# Patient Record
Sex: Female | Born: 1940 | Race: Black or African American | Hispanic: No | Smoking: Never smoker
Health system: Southern US, Community
[De-identification: ages and names within clinical notes are randomized; demographics above are authoritative.]

## PROBLEM LIST (undated history)

## (undated) DIAGNOSIS — M51369 Other intervertebral disc degeneration, lumbar region without mention of lumbar back pain or lower extremity pain: Secondary | ICD-10-CM

## (undated) DIAGNOSIS — M5136 Other intervertebral disc degeneration, lumbar region: Secondary | ICD-10-CM

## (undated) DIAGNOSIS — Z87442 Personal history of urinary calculi: Secondary | ICD-10-CM

## (undated) DIAGNOSIS — G2581 Restless legs syndrome: Secondary | ICD-10-CM

## (undated) DIAGNOSIS — N1330 Unspecified hydronephrosis: Secondary | ICD-10-CM

## (undated) DIAGNOSIS — D649 Anemia, unspecified: Secondary | ICD-10-CM

## (undated) DIAGNOSIS — K567 Ileus, unspecified: Secondary | ICD-10-CM

## (undated) DIAGNOSIS — M1990 Unspecified osteoarthritis, unspecified site: Secondary | ICD-10-CM

## (undated) DIAGNOSIS — M5416 Radiculopathy, lumbar region: Secondary | ICD-10-CM

## (undated) DIAGNOSIS — M810 Age-related osteoporosis without current pathological fracture: Secondary | ICD-10-CM

## (undated) DIAGNOSIS — E785 Hyperlipidemia, unspecified: Secondary | ICD-10-CM

## (undated) HISTORY — PX: TOTAL ABDOMINAL HYSTERECTOMY W/ BILATERAL SALPINGOOPHORECTOMY: SHX83

## (undated) HISTORY — PX: CATARACT EXTRACTION, BILATERAL: SHX1313

## (undated) HISTORY — DX: Hyperlipidemia, unspecified: E78.5

## (undated) HISTORY — PX: ABDOMINAL HYSTERECTOMY: SHX81

## (undated) HISTORY — PX: EYE SURGERY: SHX253

## (undated) HISTORY — DX: Ileus, unspecified: K56.7

## (undated) HISTORY — DX: Unspecified osteoarthritis, unspecified site: M19.90

## (undated) HISTORY — DX: Age-related osteoporosis without current pathological fracture: M81.0

## (undated) HISTORY — PX: ILEOANAL RESERVOIR EXCISION W/ ILEOSTOMY: SUR525

---

## 1978-08-03 HISTORY — PX: ILEOANAL RESERVOIR EXCISION W/ ILEOSTOMY: SUR525

## 1998-06-10 ENCOUNTER — Ambulatory Visit (HOSPITAL_COMMUNITY): Admission: RE | Admit: 1998-06-10 | Discharge: 1998-06-10 | Payer: Self-pay

## 1999-06-12 ENCOUNTER — Ambulatory Visit (HOSPITAL_COMMUNITY): Admission: RE | Admit: 1999-06-12 | Discharge: 1999-06-12 | Payer: Self-pay

## 2000-06-17 ENCOUNTER — Ambulatory Visit (HOSPITAL_COMMUNITY): Admission: RE | Admit: 2000-06-17 | Discharge: 2000-06-17 | Payer: Self-pay | Admitting: Family Medicine

## 2001-06-20 ENCOUNTER — Ambulatory Visit (HOSPITAL_COMMUNITY): Admission: RE | Admit: 2001-06-20 | Discharge: 2001-06-20 | Payer: Self-pay | Admitting: *Deleted

## 2002-06-22 ENCOUNTER — Ambulatory Visit (HOSPITAL_COMMUNITY): Admission: RE | Admit: 2002-06-22 | Discharge: 2002-06-22 | Payer: Self-pay | Admitting: *Deleted

## 2003-06-25 ENCOUNTER — Ambulatory Visit (HOSPITAL_COMMUNITY): Admission: RE | Admit: 2003-06-25 | Discharge: 2003-06-25 | Payer: Self-pay | Admitting: Obstetrics & Gynecology

## 2005-08-28 ENCOUNTER — Ambulatory Visit: Payer: Self-pay | Admitting: *Deleted

## 2005-09-16 ENCOUNTER — Ambulatory Visit: Payer: Self-pay | Admitting: *Deleted

## 2006-08-02 ENCOUNTER — Ambulatory Visit: Payer: Self-pay | Admitting: Obstetrics and Gynecology

## 2006-08-06 ENCOUNTER — Ambulatory Visit: Payer: Self-pay | Admitting: Obstetrics and Gynecology

## 2007-06-03 ENCOUNTER — Ambulatory Visit: Payer: Self-pay | Admitting: Obstetrics and Gynecology

## 2008-06-05 ENCOUNTER — Ambulatory Visit: Payer: Self-pay | Admitting: Obstetrics and Gynecology

## 2009-06-06 ENCOUNTER — Ambulatory Visit: Payer: Self-pay | Admitting: Obstetrics and Gynecology

## 2010-06-19 ENCOUNTER — Ambulatory Visit: Payer: Self-pay | Admitting: Obstetrics and Gynecology

## 2011-07-22 ENCOUNTER — Ambulatory Visit: Payer: Self-pay | Admitting: Obstetrics and Gynecology

## 2012-07-25 ENCOUNTER — Ambulatory Visit: Payer: Self-pay | Admitting: Obstetrics and Gynecology

## 2012-12-02 ENCOUNTER — Ambulatory Visit: Payer: Self-pay

## 2013-08-01 ENCOUNTER — Ambulatory Visit: Payer: Self-pay | Admitting: Obstetrics and Gynecology

## 2013-10-03 ENCOUNTER — Ambulatory Visit: Payer: Self-pay | Admitting: Family Medicine

## 2014-08-07 ENCOUNTER — Ambulatory Visit: Payer: Self-pay | Admitting: Obstetrics and Gynecology

## 2014-08-07 DIAGNOSIS — Z1231 Encounter for screening mammogram for malignant neoplasm of breast: Secondary | ICD-10-CM | POA: Diagnosis not present

## 2014-09-19 DIAGNOSIS — M810 Age-related osteoporosis without current pathological fracture: Secondary | ICD-10-CM | POA: Diagnosis not present

## 2014-09-19 DIAGNOSIS — M1990 Unspecified osteoarthritis, unspecified site: Secondary | ICD-10-CM | POA: Diagnosis not present

## 2014-09-19 DIAGNOSIS — E785 Hyperlipidemia, unspecified: Secondary | ICD-10-CM | POA: Diagnosis not present

## 2014-09-19 DIAGNOSIS — Z0000 Encounter for general adult medical examination without abnormal findings: Secondary | ICD-10-CM | POA: Diagnosis not present

## 2014-09-19 DIAGNOSIS — K5190 Ulcerative colitis, unspecified, without complications: Secondary | ICD-10-CM | POA: Diagnosis not present

## 2015-02-28 ENCOUNTER — Telehealth: Payer: Self-pay

## 2015-02-28 NOTE — Telephone Encounter (Signed)
Pt called requests call back from [facility]. Thanks.

## 2015-03-01 ENCOUNTER — Encounter: Payer: Self-pay | Admitting: Family Medicine

## 2015-03-01 ENCOUNTER — Ambulatory Visit (INDEPENDENT_AMBULATORY_CARE_PROVIDER_SITE_OTHER): Payer: Medicare Other | Admitting: Family Medicine

## 2015-03-01 VITALS — BP 122/85 | HR 94 | Temp 98.1°F | Ht 59.1 in | Wt 166.0 lb

## 2015-03-01 DIAGNOSIS — R1031 Right lower quadrant pain: Secondary | ICD-10-CM | POA: Diagnosis not present

## 2015-03-01 LAB — CBC WITH DIFFERENTIAL/PLATELET
Hematocrit: 44.8 % (ref 34.0–46.6)
Hemoglobin: 16.1 g/dL — ABNORMAL HIGH (ref 11.1–15.9)
Lymphocytes Absolute: 2.2 10*3/uL (ref 0.7–3.1)
Lymphs: 41 %
MCH: 30.5 pg (ref 26.6–33.0)
MCHC: 35.9 g/dL — ABNORMAL HIGH (ref 31.5–35.7)
MCV: 85 fL (ref 79–97)
MID (Absolute): 0.7 10*3/uL (ref 0.1–1.6)
MID: 13 %
Neutrophils Absolute: 2.5 10*3/uL (ref 1.4–7.0)
Neutrophils: 46 %
Platelets: 275 10*3/uL (ref 150–379)
RBC: 5.28 x10E6/uL (ref 3.77–5.28)
RDW: 13.5 % (ref 12.3–15.4)
WBC: 5.4 10*3/uL (ref 3.4–10.8)

## 2015-03-01 MED ORDER — METRONIDAZOLE 500 MG PO TABS: 500.0000 mg | ORAL_TABLET | Freq: Two times a day (BID) | ORAL | Status: DC

## 2015-03-01 MED ORDER — CIPROFLOXACIN HCL 500 MG PO TABS: 500.0000 mg | ORAL_TABLET | Freq: Two times a day (BID) | ORAL | Status: DC

## 2015-03-01 NOTE — Progress Notes (Signed)
BP 122/85 mmHg  Pulse 94  Temp(Src) 98.1 F (36.7 C)  Ht 4' 11.1" (1.501 m)  Wt 166 lb (75.297 kg)  BMI 33.42 kg/m2  SpO2 98%   Subjective:    Patient ID: Victoria Peterson, female    DOB: 10/17/1940, 74 y.o.   MRN: [id number]  HPI: Victoria Peterson is a 74 y.o. female  Chief Complaint  Patient presents with  . Abdominal Pain    Patient has a BCIR 18 years ago, patient is concerned that it may be inflammed. She has been pouring sweat, complaining of lightheadness.   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  Duration: 5 days Onset: sudden Severity: severe Quality: sharp, stabbing and tearing Location:  right in her reservoir   Radiation: no Frequency: constant Alleviating factors: nothing Aggravating factors: nothing Status: stable Treatments attempted:  Fever: no, lots of sweats Nausea: no Vomiting: no Weight loss: no Decreased appetite: no Diarrhea: yes- every hour on the hour- has been green and profuse Constipation: no Blood in stool: no Heartburn: yes Jaundice: no Rash: no Dysuria/urinary frequency: no Hematuria: no    Relevant past medical, surgical, family and social history reviewed and updated as indicated. Interim medical history since our last visit reviewed. Allergies and medications reviewed and updated.  Review of Systems  Constitutional: Negative.   Respiratory: Negative.   Cardiovascular: Negative.   Gastrointestinal: Positive for abdominal pain and diarrhea. Negative for nausea, vomiting, constipation, blood in stool, abdominal distention, anal bleeding and rectal pain.  Genitourinary: Negative.   Psychiatric/Behavioral: Negative.     Per HPI unless specifically indicated above     Objective:    BP 122/85 mmHg  Pulse 94  Temp(Src) 98.1 F (36.7 C)  Ht 4' 11.1" (1.501 m)  Wt 166 lb  (75.297 kg)  BMI 33.42 kg/m2  SpO2 98%  Wt Readings from Last 3 Encounters:  03/01/15 166 lb (75.297 kg)  09/19/14 176 lb (79.833 kg)    Physical Exam  Constitutional: She is oriented to person, place, and time. She appears well-developed and well-nourished. No distress.  HENT:  Head: Normocephalic and atraumatic.  Right Ear: Hearing normal.  Left Ear: Hearing normal.  Nose: Nose normal.  Eyes: Conjunctivae and lids are normal. Right eye exhibits no discharge. Left eye exhibits no discharge. No scleral icterus.  Cardiovascular: Normal rate, regular rhythm, normal heart sounds and intact distal pulses.  Exam reveals no gallop and no friction rub.   No murmur heard. Pulmonary/Chest: Effort normal. No respiratory distress. She has no wheezes. She has no rales. She exhibits no tenderness.  Abdominal: Soft. Bowel sounds are normal. She exhibits no distension and no mass. There is tenderness. There is no rebound and no guarding.  Throughout, especially RLQ  Musculoskeletal: Normal range of motion.  Neurological: She is alert and oriented to person, place, and time.  Skin: Skin is warm, dry and intact. No rash noted. No erythema. No pallor.  Psychiatric: She has a normal mood and affect. Her speech is normal and behavior is normal. Judgment and thought content normal. Cognition and memory are normal.  Nursing note and vitals reviewed.   No results found for this or any previous visit.    Assessment & Plan:   Problem List Items Addressed This Visit    None    Visit Diagnoses  Right lower quadrant abdominal pain    -  Primary    Normal CBC today. No concern for perforation. WIll check CMP. Will treat with cipro and flagyl. Call if not getting better or getting worse.     Relevant Orders    Comprehensive metabolic panel    CBC With Differential/Platelet        Follow up plan: Return for As scheduled with MAC.

## 2015-03-02 LAB — COMPREHENSIVE METABOLIC PANEL
ALT: 24 IU/L (ref 0–32)
AST: 30 IU/L (ref 0–40)
Albumin/Globulin Ratio: 1.6 (ref 1.1–2.5)
Albumin: 4.6 g/dL (ref 3.5–4.8)
Alkaline Phosphatase: 78 IU/L (ref 39–117)
BUN/Creatinine Ratio: 23 (ref 11–26)
BUN: 29 mg/dL — ABNORMAL HIGH (ref 8–27)
Bilirubin Total: 1.2 mg/dL (ref 0.0–1.2)
CO2: 16 mmol/L — ABNORMAL LOW (ref 18–29)
Calcium: 9.1 mg/dL (ref 8.7–10.3)
Chloride: 94 mmol/L — ABNORMAL LOW (ref 97–108)
Creatinine, Ser: 1.26 mg/dL — ABNORMAL HIGH (ref 0.57–1.00)
GFR calc Af Amer: 49 mL/min/{1.73_m2} — ABNORMAL LOW (ref >59)
GFR calc non Af Amer: 43 mL/min/{1.73_m2} — ABNORMAL LOW (ref >59)
Globulin, Total: 2.8 g/dL (ref 1.5–4.5)
Glucose: 109 mg/dL — ABNORMAL HIGH (ref 65–99)
Potassium: 3.6 mmol/L (ref 3.5–5.2)
Sodium: 135 mmol/L (ref 134–144)
Total Protein: 7.4 g/dL (ref 6.0–8.5)

## 2015-03-05 ENCOUNTER — Telehealth: Payer: Self-pay | Admitting: Family Medicine

## 2015-03-05 NOTE — Telephone Encounter (Signed)
Patient notified

## 2015-03-05 NOTE — Telephone Encounter (Signed)
Please let her know that she looked a little dehydrated on her blood work. We'll recheck it when she comes back in to see Dr. Jeananne Rama, but no sign of anything wrong with her gut. Thanks!

## 2015-07-12 DIAGNOSIS — M170 Bilateral primary osteoarthritis of knee: Secondary | ICD-10-CM | POA: Diagnosis not present

## 2015-07-12 DIAGNOSIS — M1712 Unilateral primary osteoarthritis, left knee: Secondary | ICD-10-CM | POA: Diagnosis not present

## 2015-08-08 ENCOUNTER — Other Ambulatory Visit: Payer: Self-pay | Admitting: Obstetrics and Gynecology

## 2015-08-08 DIAGNOSIS — Z1231 Encounter for screening mammogram for malignant neoplasm of breast: Secondary | ICD-10-CM

## 2015-08-08 DIAGNOSIS — Z01419 Encounter for gynecological examination (general) (routine) without abnormal findings: Secondary | ICD-10-CM | POA: Diagnosis not present

## 2015-08-20 ENCOUNTER — Other Ambulatory Visit: Payer: Self-pay | Admitting: Obstetrics and Gynecology

## 2015-08-20 ENCOUNTER — Ambulatory Visit
Admission: RE | Admit: 2015-08-20 | Discharge: 2015-08-20 | Disposition: A | Discharge status: Home / Self Care | Payer: Medicare Other | Source: Ambulatory Visit | Attending: Obstetrics and Gynecology | Admitting: Obstetrics and Gynecology

## 2015-08-20 DIAGNOSIS — Z1231 Encounter for screening mammogram for malignant neoplasm of breast: Secondary | ICD-10-CM

## 2015-10-15 ENCOUNTER — Ambulatory Visit (INDEPENDENT_AMBULATORY_CARE_PROVIDER_SITE_OTHER): Payer: Medicare Other | Admitting: Family Medicine

## 2015-10-15 ENCOUNTER — Encounter: Payer: Self-pay | Admitting: Family Medicine

## 2015-10-15 VITALS — BP 111/79 | HR 78 | Temp 97.9°F | Ht 59.7 in | Wt 177.0 lb

## 2015-10-15 DIAGNOSIS — K5190 Ulcerative colitis, unspecified, without complications: Secondary | ICD-10-CM

## 2015-10-15 DIAGNOSIS — Z932 Ileostomy status: Secondary | ICD-10-CM

## 2015-10-15 DIAGNOSIS — M170 Bilateral primary osteoarthritis of knee: Secondary | ICD-10-CM | POA: Insufficient documentation

## 2015-10-15 DIAGNOSIS — K51918 Ulcerative colitis, unspecified with other complication: Secondary | ICD-10-CM | POA: Diagnosis not present

## 2015-10-15 DIAGNOSIS — Z0000 Encounter for general adult medical examination without abnormal findings: Secondary | ICD-10-CM

## 2015-10-15 DIAGNOSIS — M129 Arthropathy, unspecified: Secondary | ICD-10-CM

## 2015-10-15 DIAGNOSIS — Z8719 Personal history of other diseases of the digestive system: Secondary | ICD-10-CM | POA: Insufficient documentation

## 2015-10-15 LAB — URINALYSIS, ROUTINE W REFLEX MICROSCOPIC
BILIRUBIN UA: NEGATIVE
GLUCOSE, UA: NEGATIVE
Ketones, UA: NEGATIVE
Nitrite, UA: NEGATIVE
PH UA: 5 (ref 5.0–7.5)
PROTEIN UA: NEGATIVE
Specific Gravity, UA: <1.005 — ABNORMAL LOW (ref 1.005–1.030)
Urobilinogen, Ur: 0.2 mg/dL (ref 0.2–1.0)

## 2015-10-15 LAB — MICROSCOPIC EXAMINATION

## 2015-10-15 NOTE — Assessment & Plan Note (Signed)
Stable on occ meds

## 2015-10-15 NOTE — Progress Notes (Signed)
BP 111/79 mmHg  Pulse 78  Temp(Src) 97.9 F (36.6 C)  Ht 4' 11.7" (1.516 m)  Wt 177 lb (80.287 kg)  BMI 34.93 kg/m2  SpO2 98%   Subjective:    Patient ID: Victoria Peterson, female    DOB: Dec 06, 1940, 75 y.o.   MRN: [id number]  HPI: Victoria Peterson is a 75 y.o. female  Chief Complaint  Patient presents with  . Annual Exam   patient with bilateral arthritis in knees bother her when he is getting ready to range and otherwise knees do okay takes an occasional Aleve Knees will slow her down especially in the morning but don't stop her activity. [facility] had a shot which helped an all in all is doing okay.  Patient also has some back right back pain comes and goes no radiation no blood in stool or urine and occasional Tylenol or Aleve may help that. No known trauma or irritation.  Relevant past medical, surgical, family and social history reviewed and updated as indicated. Interim medical history since our last visit reviewed. Allergies and medications reviewed and updated.  Review of Systems  Constitutional: Negative.   HENT: Negative.   Eyes: Negative.   Respiratory: Negative.   Cardiovascular: Negative.   Gastrointestinal: Negative.   Endocrine: Negative.   Genitourinary: Negative.   Musculoskeletal: Negative.   Skin: Negative.   Allergic/Immunologic: Negative.   Neurological: Negative.   Hematological: Negative.   Psychiatric/Behavioral: Negative.     Per HPI unless specifically indicated above     Objective:    BP 111/79 mmHg  Pulse 78  Temp(Src) 97.9 F (36.6 C)  Ht 4' 11.7" (1.516 m)  Wt 177 lb (80.287 kg)  BMI 34.93 kg/m2  SpO2 98%  Wt Readings from Last 3 Encounters:  10/15/15 177 lb (80.287 kg)  03/01/15 166 lb (75.297 kg)  09/19/14 176 lb (79.833 kg)    Physical Exam  Constitutional: She is oriented to person, place, and time. She appears well-developed and well-nourished.  HENT:  Head: Normocephalic and atraumatic.  Right  Ear: External ear normal.  Left Ear: External ear normal.  Nose: Nose normal.  Mouth/Throat: Oropharynx is clear and moist.  Eyes: Conjunctivae and EOM are normal. Pupils are equal, round, and reactive to light.  Neck: Normal range of motion. Neck supple. Carotid bruit is not present.  Cardiovascular: Normal rate, regular rhythm and normal heart sounds.   No murmur heard. Pulmonary/Chest: Effort normal and breath sounds normal. She exhibits no mass. Right breast exhibits no mass, no skin change and no tenderness. Left breast exhibits no mass, no skin change and no tenderness. Breasts are symmetrical.  Abdominal: Soft. Bowel sounds are normal. There is no hepatosplenomegaly.  Musculoskeletal: Normal range of motion.  Neurological: She is alert and oriented to person, place, and time.  Skin: No rash noted.  Psychiatric: She has a normal mood and affect. Her behavior is normal. Judgment and thought content normal.    Results for orders placed or performed in visit on 03/01/15  Comprehensive metabolic panel  Result Value Ref Range   Glucose 109 (H) 65 - 99 mg/dL   BUN 29 (H) 8 - 27 mg/dL   Creatinine, Ser 1.26 (H) 0.57 - 1.00 mg/dL   GFR calc non Af Amer 43 (L) >59 mL/min/1.73   GFR calc Af Amer 49 (L) >59 mL/min/1.73   BUN/Creatinine Ratio 23 11 - 26   Sodium 135 134 - 144 mmol/L   Potassium 3.6 3.5 -  5.2 mmol/L   Chloride 94 (L) 97 - 108 mmol/L   CO2 16 (L) 18 - 29 mmol/L   Calcium 9.1 8.7 - 10.3 mg/dL   Total Protein 7.4 6.0 - 8.5 g/dL   Albumin 4.6 3.5 - 4.8 g/dL   Globulin, Total 2.8 1.5 - 4.5 g/dL   Albumin/Globulin Ratio 1.6 1.1 - 2.5   Bilirubin Total 1.2 0.0 - 1.2 mg/dL   Alkaline Phosphatase 78 39 - 117 IU/L   AST 30 0 - 40 IU/L   ALT 24 0 - 32 IU/L  CBC With Differential/Platelet  Result Value Ref Range   WBC 5.4 3.4 - 10.8 x10E3/uL   RBC 5.28 3.77 - 5.28 x10E6/uL   Hemoglobin 16.1 (H) 11.1 - 15.9 g/dL   Hematocrit 44.8 34.0 - 46.6 %   MCV 85 79 - 97 fL   MCH 30.5  26.6 - 33.0 pg   MCHC 35.9 (H) 31.5 - 35.7 g/dL   RDW 13.5 12.3 - 15.4 %   Platelets 275 150 - 379 x10E3/uL   Neutrophils 46 %   Lymphs 41 %   MID 13 %   Neutrophils Absolute 2.5 1.4 - 7.0 x10E3/uL   Lymphocytes Absolute 2.2 0.7 - 3.1 x10E3/uL   MID (Absolute) 0.7 0.1 - 1.6 X10E3/uL      Assessment & Plan:   Problem List Items Addressed This Visit      Digestive   Severe chronic ulcerative colitis (HCC)   Relevant Orders   Lipid panel   CBC with Differential/Platelet   TSH   Comprehensive metabolic panel   Urinalysis, Routine w reflex microscopic (not at [facility])     Musculoskeletal and Integument   Arthritis of both knees    Stable on occ meds      Relevant Orders   Lipid panel   CBC with Differential/Platelet   TSH   Comprehensive metabolic panel   Urinalysis, Routine w reflex microscopic (not at [facility])     Other   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

## 2015-10-16 ENCOUNTER — Encounter: Payer: Self-pay | Admitting: Family Medicine

## 2015-10-16 LAB — COMPREHENSIVE METABOLIC PANEL
A/G RATIO: 1.6 (ref 1.2–2.2)
ALT: 15 IU/L (ref 0–32)
AST: 19 IU/L (ref 0–40)
Albumin: 4.3 g/dL (ref 3.5–4.8)
Alkaline Phosphatase: 53 IU/L (ref 39–117)
BUN/Creatinine Ratio: 11 (ref 11–26)
BUN: 10 mg/dL (ref 8–27)
Bilirubin Total: 1 mg/dL (ref 0.0–1.2)
CALCIUM: 9.4 mg/dL (ref 8.7–10.3)
CO2: 24 mmol/L (ref 18–29)
CREATININE: 0.93 mg/dL (ref 0.57–1.00)
Chloride: 102 mmol/L (ref 96–106)
GFR calc Af Amer: 71 mL/min/{1.73_m2} (ref >59)
GFR, EST NON AFRICAN AMERICAN: 61 mL/min/{1.73_m2} (ref >59)
Globulin, Total: 2.7 g/dL (ref 1.5–4.5)
Glucose: 87 mg/dL (ref 65–99)
POTASSIUM: 4.3 mmol/L (ref 3.5–5.2)
Sodium: 140 mmol/L (ref 134–144)
Total Protein: 7 g/dL (ref 6.0–8.5)

## 2015-10-16 LAB — CBC WITH DIFFERENTIAL/PLATELET
BASOS ABS: 0 10*3/uL (ref 0.0–0.2)
Basos: 0 %
EOS (ABSOLUTE): 0 10*3/uL (ref 0.0–0.4)
Eos: 1 %
Hematocrit: 41.4 % (ref 34.0–46.6)
Hemoglobin: 14.4 g/dL (ref 11.1–15.9)
IMMATURE GRANS (ABS): 0 10*3/uL (ref 0.0–0.1)
IMMATURE GRANULOCYTES: 0 %
LYMPHS: 37 %
Lymphocytes Absolute: 1.8 10*3/uL (ref 0.7–3.1)
MCH: 29.8 pg (ref 26.6–33.0)
MCHC: 34.8 g/dL (ref 31.5–35.7)
MCV: 86 fL (ref 79–97)
Monocytes Absolute: 0.5 10*3/uL (ref 0.1–0.9)
Monocytes: 10 %
NEUTROS PCT: 52 %
Neutrophils Absolute: 2.5 10*3/uL (ref 1.4–7.0)
PLATELETS: 249 10*3/uL (ref 150–379)
RBC: 4.84 x10E6/uL (ref 3.77–5.28)
RDW: 13.2 % (ref 12.3–15.4)
WBC: 4.8 10*3/uL (ref 3.4–10.8)

## 2015-10-16 LAB — TSH: TSH: 0.932 u[IU]/mL (ref 0.450–4.500)

## 2015-10-16 LAB — LIPID PANEL
CHOL/HDL RATIO: 2.8 ratio (ref 0.0–4.4)
Cholesterol, Total: 263 mg/dL — ABNORMAL HIGH (ref 100–199)
HDL: 95 mg/dL (ref >39)
LDL CALC: 153 mg/dL — AB (ref 0–99)
Triglycerides: 75 mg/dL (ref 0–149)
VLDL Cholesterol Cal: 15 mg/dL (ref 5–40)

## 2016-08-04 ENCOUNTER — Other Ambulatory Visit: Payer: Self-pay | Admitting: Family Medicine

## 2016-08-04 DIAGNOSIS — Z1231 Encounter for screening mammogram for malignant neoplasm of breast: Secondary | ICD-10-CM

## 2016-08-07 ENCOUNTER — Encounter: Payer: Self-pay | Admitting: Family Medicine

## 2016-08-07 ENCOUNTER — Ambulatory Visit: Payer: Medicare Other | Admitting: Family Medicine

## 2016-08-07 VITALS — BP 112/81 | HR 112 | Temp 98.0°F | Wt 167.0 lb

## 2016-08-07 DIAGNOSIS — R55 Syncope and collapse: Secondary | ICD-10-CM

## 2016-08-07 NOTE — Progress Notes (Signed)
BP 112/81   Pulse (!) 112   Temp 98 F (36.7 C)   Wt 167 lb (75.8 kg)   SpO2 99%   BMI 32.94 kg/m    Subjective:    Patient ID: Victoria Peterson, female    DOB: October 22, 1940, 76 y.o.   MRN: [id number]  HPI: Victoria Peterson is a 76 y.o. female  Chief Complaint  Patient presents with  . URI    x 6 days, chest tightness, sore throat, productive cough, head congestion, pressure with right ear. No fever.  . Loss of Consciousness    happened yesterday, when she got up, she states every thing started getting grey. She woke up and she was on the floor. Not sure how long she'd been down. Got some water and she was fine.     Relevant past medical, surgical, family and social history reviewed and updated as indicated. Interim medical history since our last visit reviewed. Allergies and medications reviewed and updated.  Review of Systems  Per HPI unless specifically indicated above     Objective:    BP 112/81   Pulse (!) 112   Temp 98 F (36.7 C)   Wt 167 lb (75.8 kg)   SpO2 99%   BMI 32.94 kg/m   Wt Readings from Last 3 Encounters:  08/07/16 167 lb (75.8 kg)  10/15/15 177 lb (80.3 kg)  03/01/15 166 lb (75.3 kg)    Physical Exam  Results for orders placed or performed in visit on 10/15/15  Microscopic Examination  Result Value Ref Range   WBC, UA 0-5 0 - 5 /hpf   RBC, UA 0-2 0 - 2 /hpf   Epithelial Cells (non renal) 0-10 0 - 10 /hpf   Mucus, UA Present Not Estab.   Bacteria, UA Few None seen/Few  Lipid panel  Result Value Ref Range   Cholesterol, Total 263 (H) 100 - 199 mg/dL   Triglycerides 75 0 - 149 mg/dL   HDL 95 >39 mg/dL   VLDL Cholesterol Cal 15 5 - 40 mg/dL   LDL Calculated 153 (H) 0 - 99 mg/dL   Chol/HDL Ratio 2.8 0.0 - 4.4 ratio units  CBC with Differential/Platelet  Result Value Ref Range   WBC 4.8 3.4 - 10.8 x10E3/uL   RBC 4.84 3.77 - 5.28 x10E6/uL   Hemoglobin 14.4 11.1 - 15.9 g/dL   Hematocrit 41.4 34.0 - 46.6 %   MCV 86 79 - 97 fL   MCH 29.8  26.6 - 33.0 pg   MCHC 34.8 31.5 - 35.7 g/dL   RDW 13.2 12.3 - 15.4 %   Platelets 249 150 - 379 x10E3/uL   Neutrophils 52 %   Lymphs 37 %   Monocytes 10 %   Eos 1 %   Basos 0 %   Neutrophils Absolute 2.5 1.4 - 7.0 x10E3/uL   Lymphocytes Absolute 1.8 0.7 - 3.1 x10E3/uL   Monocytes Absolute 0.5 0.1 - 0.9 x10E3/uL   EOS (ABSOLUTE) 0.0 0.0 - 0.4 x10E3/uL   Basophils Absolute 0.0 0.0 - 0.2 x10E3/uL   Immature Granulocytes 0 %   Immature Grans (Abs) 0.0 0.0 - 0.1 x10E3/uL  TSH  Result Value Ref Range   TSH 0.932 0.450 - 4.500 uIU/mL  Comprehensive metabolic panel  Result Value Ref Range   Glucose 87 65 - 99 mg/dL   BUN 10 8 - 27 mg/dL   Creatinine, Ser 0.93 0.57 - 1.00 mg/dL   GFR calc non Af Amer 61 >59  mL/min/1.73   GFR calc Af Amer 71 >59 mL/min/1.73   BUN/Creatinine Ratio 11 11 - 26   Sodium 140 134 - 144 mmol/L   Potassium 4.3 3.5 - 5.2 mmol/L   Chloride 102 96 - 106 mmol/L   CO2 24 18 - 29 mmol/L   Calcium 9.4 8.7 - 10.3 mg/dL   Total Protein 7.0 6.0 - 8.5 g/dL   Albumin 4.3 3.5 - 4.8 g/dL   Globulin, Total 2.7 1.5 - 4.5 g/dL   Albumin/Globulin Ratio 1.6 1.2 - 2.2   Bilirubin Total 1.0 0.0 - 1.2 mg/dL   Alkaline Phosphatase 53 39 - 117 IU/L   AST 19 0 - 40 IU/L   ALT 15 0 - 32 IU/L  Urinalysis, Routine w reflex microscopic (not at [facility])  Result Value Ref Range   Specific Gravity, UA <1.005 (L) 1.005 - 1.030   pH, UA 5.0 5.0 - 7.5   Color, UA Yellow Yellow   Appearance Ur Clear Clear   Leukocytes, UA Trace (A) Negative   Protein, UA Negative Negative/Trace   Glucose, UA Negative Negative   Ketones, UA Negative Negative   RBC, UA 3+ (A) Negative   Bilirubin, UA Negative Negative   Urobilinogen, Ur 0.2 0.2 - 1.0 mg/dL   Nitrite, UA Negative Negative   Microscopic Examination See below:       Assessment & Plan:   Problem List Items Addressed This Visit    None    Visit Diagnoses    Syncope, unspecified syncope type    -  Primary   Discussed need for full  evaluation at ER, especially given fall from standing with unknown head impact. Discussed that she is not to drive.     Patient will have daughter pick her up from office and head to the ER for evaluation as recommended.   Follow up plan: Return if symptoms worsen or fail to improve.

## 2016-08-07 NOTE — Patient Instructions (Signed)
Please have a friend or family member drive you to the ER to be fully evaluated.

## 2016-09-02 ENCOUNTER — Ambulatory Visit
Admission: RE | Admit: 2016-09-02 | Discharge: 2016-09-02 | Disposition: A | Discharge status: Home / Self Care | Payer: Medicare Other | Source: Ambulatory Visit | Attending: Family Medicine | Admitting: Family Medicine

## 2016-09-02 DIAGNOSIS — Z1231 Encounter for screening mammogram for malignant neoplasm of breast: Secondary | ICD-10-CM | POA: Insufficient documentation

## 2016-10-20 ENCOUNTER — Ambulatory Visit (INDEPENDENT_AMBULATORY_CARE_PROVIDER_SITE_OTHER): Payer: Medicare Other | Admitting: Family Medicine

## 2016-10-20 ENCOUNTER — Encounter: Payer: Self-pay | Admitting: Family Medicine

## 2016-10-20 VITALS — BP 137/83 | HR 77 | Ht 60.24 in | Wt 176.2 lb

## 2016-10-20 DIAGNOSIS — K51918 Ulcerative colitis, unspecified with other complication: Secondary | ICD-10-CM | POA: Diagnosis not present

## 2016-10-20 DIAGNOSIS — Z13220 Encounter for screening for lipoid disorders: Secondary | ICD-10-CM

## 2016-10-20 DIAGNOSIS — Z0000 Encounter for general adult medical examination without abnormal findings: Secondary | ICD-10-CM

## 2016-10-20 DIAGNOSIS — Z1329 Encounter for screening for other suspected endocrine disorder: Secondary | ICD-10-CM

## 2016-10-20 DIAGNOSIS — M170 Bilateral primary osteoarthritis of knee: Secondary | ICD-10-CM

## 2016-10-20 DIAGNOSIS — R55 Syncope and collapse: Secondary | ICD-10-CM | POA: Diagnosis not present

## 2016-10-20 LAB — URINALYSIS, ROUTINE W REFLEX MICROSCOPIC
Bilirubin, UA: NEGATIVE
Glucose, UA: NEGATIVE
Ketones, UA: NEGATIVE
Nitrite, UA: NEGATIVE
PH UA: 5 (ref 5.0–7.5)
PROTEIN UA: NEGATIVE
Specific Gravity, UA: 1.01 (ref 1.005–1.030)
Urobilinogen, Ur: 0.2 mg/dL (ref 0.2–1.0)

## 2016-10-20 LAB — MICROSCOPIC EXAMINATION: BACTERIA UA: NONE SEEN

## 2016-10-20 NOTE — Assessment & Plan Note (Signed)
The current medical regimen is effective;  continue present plan and medications.  

## 2016-10-20 NOTE — Assessment & Plan Note (Signed)
Syncope most likely due to vasovagal event. EKG with no acute changes or rhythm concerns. Patient education given on hydration and fluid status which patient is already doing.

## 2016-10-20 NOTE — Progress Notes (Signed)
BP 137/83   Pulse 77   Ht 5' 0.24" (1.53 m)   Wt 176 lb 3.2 oz (79.9 kg)   SpO2 95%   BMI 34.14 kg/m    Subjective:    Patient ID: Victoria Peterson, female    DOB: February 26, 1941, 76 y.o.   MRN: [id number]  HPI: Victoria Peterson is a 76 y.o. female  Chief Complaint  Patient presents with  . Annual Exam   Patient all in all doing well except noted in January had a syncopal spell. This spell lasted only for a few seconds patient got up out of bed started walking to the kitchen and found herself on the floor just a couple steps away from the bed. She then got up and was okay was concerned about ear infection and came to the office was referred to the emergency room but never went. Patient's been fine since with no other symptoms. Does have some stiffness in the morning when she gets up. No problems with ulcerative colitis only taking vitamins and aspirin.   Relevant past medical, surgical, family and social history reviewed and updated as indicated. Interim medical history since our last visit reviewed. Allergies and medications reviewed and updated.  Review of Systems  Constitutional: Negative.   HENT: Negative.   Eyes: Negative.   Respiratory: Negative.   Cardiovascular: Negative.   Gastrointestinal: Negative.   Endocrine: Negative.   Genitourinary: Negative.   Musculoskeletal: Negative.   Skin: Negative.   Allergic/Immunologic: Negative.   Neurological: Negative.   Hematological: Negative.   Psychiatric/Behavioral: Negative.     Per HPI unless specifically indicated above     Objective:    BP 137/83   Pulse 77   Ht 5' 0.24" (1.53 m)   Wt 176 lb 3.2 oz (79.9 kg)   SpO2 95%   BMI 34.14 kg/m   Wt Readings from Last 3 Encounters:  10/20/16 176 lb 3.2 oz (79.9 kg)  08/07/16 167 lb (75.8 kg)  10/15/15 177 lb (80.3 kg)    Physical Exam  Constitutional: She is oriented to person, place, and time. She appears well-developed and well-nourished.  HENT:  Head:  Normocephalic and atraumatic.  Right Ear: External ear normal.  Left Ear: External ear normal.  Nose: Nose normal.  Mouth/Throat: Oropharynx is clear and moist.  Eyes: Conjunctivae and EOM are normal. Pupils are equal, round, and reactive to light.  Neck: Normal range of motion. Neck supple. Carotid bruit is not present.  Cardiovascular: Normal rate, regular rhythm and normal heart sounds.   No murmur heard. Pulmonary/Chest: Effort normal and breath sounds normal. She exhibits no mass. Right breast exhibits no mass, no skin change and no tenderness. Left breast exhibits no mass, no skin change and no tenderness. Breasts are symmetrical.  Abdominal: Soft. Bowel sounds are normal. There is no hepatosplenomegaly.  Musculoskeletal: Normal range of motion.  Neurological: She is alert and oriented to person, place, and time.  Skin: No rash noted.  Psychiatric: She has a normal mood and affect. Her behavior is normal. Judgment and thought content normal.    Results for orders placed or performed in visit on 10/15/15  Microscopic Examination  Result Value Ref Range   WBC, UA 0-5 0 - 5 /hpf   RBC, UA 0-2 0 - 2 /hpf   Epithelial Cells (non renal) 0-10 0 - 10 /hpf   Mucus, UA Present Not Estab.   Bacteria, UA Few None seen/Few  Lipid panel  Result Value Ref Range  Cholesterol, Total 263 (H) 100 - 199 mg/dL   Triglycerides 75 0 - 149 mg/dL   HDL 95 >39 mg/dL   VLDL Cholesterol Cal 15 5 - 40 mg/dL   LDL Calculated 153 (H) 0 - 99 mg/dL   Chol/HDL Ratio 2.8 0.0 - 4.4 ratio units  CBC with Differential/Platelet  Result Value Ref Range   WBC 4.8 3.4 - 10.8 x10E3/uL   RBC 4.84 3.77 - 5.28 x10E6/uL   Hemoglobin 14.4 11.1 - 15.9 g/dL   Hematocrit 41.4 34.0 - 46.6 %   MCV 86 79 - 97 fL   MCH 29.8 26.6 - 33.0 pg   MCHC 34.8 31.5 - 35.7 g/dL   RDW 13.2 12.3 - 15.4 %   Platelets 249 150 - 379 x10E3/uL   Neutrophils 52 %   Lymphs 37 %   Monocytes 10 %   Eos 1 %   Basos 0 %   Neutrophils  Absolute 2.5 1.4 - 7.0 x10E3/uL   Lymphocytes Absolute 1.8 0.7 - 3.1 x10E3/uL   Monocytes Absolute 0.5 0.1 - 0.9 x10E3/uL   EOS (ABSOLUTE) 0.0 0.0 - 0.4 x10E3/uL   Basophils Absolute 0.0 0.0 - 0.2 x10E3/uL   Immature Granulocytes 0 %   Immature Grans (Abs) 0.0 0.0 - 0.1 x10E3/uL  TSH  Result Value Ref Range   TSH 0.932 0.450 - 4.500 uIU/mL  Comprehensive metabolic panel  Result Value Ref Range   Glucose 87 65 - 99 mg/dL   BUN 10 8 - 27 mg/dL   Creatinine, Ser 0.93 0.57 - 1.00 mg/dL   GFR calc non Af Amer 61 >59 mL/min/1.73   GFR calc Af Amer 71 >59 mL/min/1.73   BUN/Creatinine Ratio 11 11 - 26   Sodium 140 134 - 144 mmol/L   Potassium 4.3 3.5 - 5.2 mmol/L   Chloride 102 96 - 106 mmol/L   CO2 24 18 - 29 mmol/L   Calcium 9.4 8.7 - 10.3 mg/dL   Total Protein 7.0 6.0 - 8.5 g/dL   Albumin 4.3 3.5 - 4.8 g/dL   Globulin, Total 2.7 1.5 - 4.5 g/dL   Albumin/Globulin Ratio 1.6 1.2 - 2.2   Bilirubin Total 1.0 0.0 - 1.2 mg/dL   Alkaline Phosphatase 53 39 - 117 IU/L   AST 19 0 - 40 IU/L   ALT 15 0 - 32 IU/L  Urinalysis, Routine w reflex microscopic (not at [facility])  Result Value Ref Range   Specific Gravity, UA <1.005 (L) 1.005 - 1.030   pH, UA 5.0 5.0 - 7.5   Color, UA Yellow Yellow   Appearance Ur Clear Clear   Leukocytes, UA Trace (A) Negative   Protein, UA Negative Negative/Trace   Glucose, UA Negative Negative   Ketones, UA Negative Negative   RBC, UA 3+ (A) Negative   Bilirubin, UA Negative Negative   Urobilinogen, Ur 0.2 0.2 - 1.0 mg/dL   Nitrite, UA Negative Negative   Microscopic Examination See below:       Assessment & Plan:   Problem List Items Addressed This Visit      Cardiovascular and Mediastinum   Syncope    Syncope most likely due to vasovagal event. EKG with no acute changes or rhythm concerns. Patient education given on hydration and fluid status which patient is already doing.      Relevant Orders   EKG 12-Lead (Completed)     Digestive   Severe  chronic ulcerative colitis ([place])    The current medical regimen is effective;  continue present plan and medications.         Musculoskeletal and Integument   Arthritis of both knees    The current medical regimen is effective;  continue present plan and medications.        Other Visit Diagnoses    Annual physical exam    -  Primary   Relevant Orders   CBC with Differential/Platelet   Comprehensive metabolic panel   Lipid panel   TSH   Urinalysis, Routine w reflex microscopic   Screening cholesterol level       Relevant Orders   Lipid panel   Thyroid disorder screen       Relevant Orders   TSH       Follow up plan: Return in about 1 year (around 10/20/2017) for Physical Exam.

## 2016-10-21 ENCOUNTER — Telehealth: Payer: Self-pay | Admitting: Family Medicine

## 2016-10-21 LAB — CBC WITH DIFFERENTIAL/PLATELET
BASOS ABS: 0 10*3/uL (ref 0.0–0.2)
Basos: 1 %
EOS (ABSOLUTE): 0.1 10*3/uL (ref 0.0–0.4)
Eos: 2 %
HEMOGLOBIN: 13.9 g/dL (ref 11.1–15.9)
Hematocrit: 40.2 % (ref 34.0–46.6)
Immature Grans (Abs): 0 10*3/uL (ref 0.0–0.1)
Immature Granulocytes: 0 %
LYMPHS ABS: 1.7 10*3/uL (ref 0.7–3.1)
Lymphs: 42 %
MCH: 29.4 pg (ref 26.6–33.0)
MCHC: 34.6 g/dL (ref 31.5–35.7)
MCV: 85 fL (ref 79–97)
MONOCYTES: 8 %
MONOS ABS: 0.3 10*3/uL (ref 0.1–0.9)
NEUTROS ABS: 1.9 10*3/uL (ref 1.4–7.0)
Neutrophils: 47 %
PLATELETS: 242 10*3/uL (ref 150–379)
RBC: 4.72 x10E6/uL (ref 3.77–5.28)
RDW: 14.1 % (ref 12.3–15.4)
WBC: 4 10*3/uL (ref 3.4–10.8)

## 2016-10-21 LAB — COMPREHENSIVE METABOLIC PANEL
A/G RATIO: 1.6 (ref 1.2–2.2)
ALT: 13 IU/L (ref 0–32)
AST: 24 IU/L (ref 0–40)
Albumin: 4.2 g/dL (ref 3.5–4.8)
Alkaline Phosphatase: 57 IU/L (ref 39–117)
BILIRUBIN TOTAL: 0.8 mg/dL (ref 0.0–1.2)
BUN/Creatinine Ratio: 14 (ref 12–28)
BUN: 13 mg/dL (ref 8–27)
CALCIUM: 9.1 mg/dL (ref 8.7–10.3)
CHLORIDE: 103 mmol/L (ref 96–106)
CO2: 22 mmol/L (ref 18–29)
Creatinine, Ser: 0.94 mg/dL (ref 0.57–1.00)
GFR calc Af Amer: 69 mL/min/{1.73_m2} (ref >59)
GFR calc non Af Amer: 60 mL/min/{1.73_m2} (ref >59)
Globulin, Total: 2.6 g/dL (ref 1.5–4.5)
Glucose: 148 mg/dL — ABNORMAL HIGH (ref 65–99)
POTASSIUM: 4.1 mmol/L (ref 3.5–5.2)
Sodium: 142 mmol/L (ref 134–144)
Total Protein: 6.8 g/dL (ref 6.0–8.5)

## 2016-10-21 LAB — TSH: TSH: 0.799 u[IU]/mL (ref 0.450–4.500)

## 2016-10-21 LAB — LIPID PANEL
CHOLESTEROL TOTAL: 223 mg/dL — AB (ref 100–199)
Chol/HDL Ratio: 2.7 ratio units (ref 0.0–4.4)
HDL: 83 mg/dL (ref >39)
LDL Calculated: 121 mg/dL — ABNORMAL HIGH (ref 0–99)
TRIGLYCERIDES: 95 mg/dL (ref 0–149)
VLDL CHOLESTEROL CAL: 19 mg/dL (ref 5–40)

## 2016-10-21 NOTE — Telephone Encounter (Signed)
Phone call Discussed with patient elevated glucose patient was not fasting had eaten prior to this blood work. We'll pay attention next visit for fasting and or consideration of hemoglobin A1c.

## 2016-11-05 DIAGNOSIS — Z124 Encounter for screening for malignant neoplasm of cervix: Secondary | ICD-10-CM | POA: Diagnosis not present

## 2016-11-05 DIAGNOSIS — Z1231 Encounter for screening mammogram for malignant neoplasm of breast: Secondary | ICD-10-CM | POA: Diagnosis not present

## 2016-12-22 DIAGNOSIS — Z961 Presence of intraocular lens: Secondary | ICD-10-CM | POA: Diagnosis not present

## 2017-01-14 DIAGNOSIS — M170 Bilateral primary osteoarthritis of knee: Secondary | ICD-10-CM | POA: Diagnosis not present

## 2017-01-14 DIAGNOSIS — M5416 Radiculopathy, lumbar region: Secondary | ICD-10-CM | POA: Diagnosis not present

## 2017-01-18 DIAGNOSIS — M170 Bilateral primary osteoarthritis of knee: Secondary | ICD-10-CM | POA: Diagnosis not present

## 2017-01-22 DIAGNOSIS — M48061 Spinal stenosis, lumbar region without neurogenic claudication: Secondary | ICD-10-CM | POA: Diagnosis not present

## 2017-01-22 DIAGNOSIS — M5416 Radiculopathy, lumbar region: Secondary | ICD-10-CM | POA: Diagnosis not present

## 2017-02-02 DIAGNOSIS — H40003 Preglaucoma, unspecified, bilateral: Secondary | ICD-10-CM | POA: Diagnosis not present

## 2017-02-25 DIAGNOSIS — M48061 Spinal stenosis, lumbar region without neurogenic claudication: Secondary | ICD-10-CM | POA: Diagnosis not present

## 2017-03-03 DIAGNOSIS — M5416 Radiculopathy, lumbar region: Secondary | ICD-10-CM | POA: Diagnosis not present

## 2017-03-25 DIAGNOSIS — M5416 Radiculopathy, lumbar region: Secondary | ICD-10-CM | POA: Diagnosis not present

## 2017-09-17 ENCOUNTER — Other Ambulatory Visit: Payer: Self-pay | Admitting: Family Medicine

## 2017-09-21 ENCOUNTER — Other Ambulatory Visit: Payer: Self-pay | Admitting: Family Medicine

## 2017-09-21 DIAGNOSIS — Z1231 Encounter for screening mammogram for malignant neoplasm of breast: Secondary | ICD-10-CM

## 2017-09-22 ENCOUNTER — Telehealth: Payer: Self-pay

## 2017-09-22 NOTE — Telephone Encounter (Signed)
Copied from [place] [id number]. Topic: Medicare AWV >> Sep 22, 2017  2:15 PM [place], [place] A, LPN wrote: Reason for CRM: Called to schedule medicare annual wellness visit with [facility] at [facility]. Can be schedule either same day as CPE with Dr.Crissman (at 9:30am on 10/25/2017) or can be schedule anytime between 10/18/17 to 10/22/17 with lab work.

## 2017-10-18 ENCOUNTER — Ambulatory Visit
Admission: RE | Admit: 2017-10-18 | Discharge: 2017-10-18 | Disposition: A | Discharge status: Home / Self Care | Payer: Medicare Other | Source: Ambulatory Visit | Attending: Family Medicine | Admitting: Family Medicine

## 2017-10-18 ENCOUNTER — Encounter: Payer: Self-pay | Admitting: Family Medicine

## 2017-10-18 DIAGNOSIS — Z1231 Encounter for screening mammogram for malignant neoplasm of breast: Secondary | ICD-10-CM | POA: Diagnosis not present

## 2017-10-20 ENCOUNTER — Other Ambulatory Visit: Payer: Self-pay | Admitting: Family Medicine

## 2017-10-20 ENCOUNTER — Ambulatory Visit (INDEPENDENT_AMBULATORY_CARE_PROVIDER_SITE_OTHER): Payer: Medicare Other

## 2017-10-20 ENCOUNTER — Encounter: Payer: Self-pay | Admitting: Family Medicine

## 2017-10-20 VITALS — BP 118/82 | HR 62 | Temp 97.6°F | Resp 16 | Ht 59.0 in | Wt 173.6 lb

## 2017-10-20 DIAGNOSIS — N390 Urinary tract infection, site not specified: Secondary | ICD-10-CM

## 2017-10-20 DIAGNOSIS — E785 Hyperlipidemia, unspecified: Secondary | ICD-10-CM | POA: Diagnosis not present

## 2017-10-20 DIAGNOSIS — Z0000 Encounter for general adult medical examination without abnormal findings: Secondary | ICD-10-CM | POA: Diagnosis not present

## 2017-10-20 DIAGNOSIS — R5383 Other fatigue: Secondary | ICD-10-CM

## 2017-10-20 DIAGNOSIS — Z87448 Personal history of other diseases of urinary system: Secondary | ICD-10-CM

## 2017-10-20 LAB — URINALYSIS, ROUTINE W REFLEX MICROSCOPIC
Bilirubin, UA: NEGATIVE
Glucose, UA: NEGATIVE
Ketones, UA: NEGATIVE
NITRITE UA: NEGATIVE
PH UA: 5 (ref 5.0–7.5)
SPEC GRAV UA: 1.015 (ref 1.005–1.030)
Urobilinogen, Ur: 0.2 mg/dL (ref 0.2–1.0)

## 2017-10-20 LAB — MICROSCOPIC EXAMINATION: WBC, UA: >30 /hpf — AB (ref 0–?)

## 2017-10-20 NOTE — Patient Instructions (Addendum)
[name] , Thank you for taking time to come for your Medicare Wellness Visit. I appreciate your ongoing commitment to your health goals. Please review the following plan we discussed and let me know if I can assist you in the future.   Screening recommendations/referrals: Colonoscopy: no longer required Mammogram:completed 10/18/2017 Bone Density: completed 10/03/2013 Recommended yearly ophthalmology/optometry visit for glaucoma screening and checkup Recommended yearly dental visit for hygiene and checkup  Vaccinations: Influenza vaccine: declined  Pneumococcal vaccine: up to date Tdap vaccine: up to date   Shingles vaccine: eligible, check with your insurance company for coverage   Advanced directives: Please bring a copy of your health care power of attorney and living will to the office at your convenience.  Conditions/risks identified: Recommend continue drinking at least 6-8 glasses of water a day   Next appointment: Follow up on 10/25/2017 at 10:00am with Dr.Crissman. Follow up in one year for your annual wellness exam.   Preventive Care 65 Years and Older, Female Preventive care refers to lifestyle choices and visits with your health care provider that can promote health and wellness. What does preventive care include?  A yearly physical exam. This is also called an annual well check.  Dental exams once or twice a year.  Routine eye exams. Ask your health care provider how often you should have your eyes checked.  Personal lifestyle choices, including:  Daily care of your teeth and gums.  Regular physical activity.  Eating a healthy diet.  Avoiding tobacco and drug use.  Limiting alcohol use.  Practicing safe sex.  Taking low-dose aspirin every day.  Taking vitamin and mineral supplements as recommended by your health care provider. What happens during an annual well check? The services and screenings done by your health care provider during your annual well  check will depend on your age, overall health, lifestyle risk factors, and family history of disease. Counseling  Your health care provider may ask you questions about your:  Alcohol use.  Tobacco use.  Drug use.  Emotional well-being.  Home and relationship well-being.  Sexual activity.  Eating habits.  History of falls.  Memory and ability to understand (cognition).  Work and work Statistician.  Reproductive health. Screening  You may have the following tests or measurements:  Height, weight, and BMI.  Blood pressure.  Lipid and cholesterol levels. These may be checked every 5 years, or more frequently if you are over 14 years old.  Skin check.  Lung cancer screening. You may have this screening every year starting at age 47 if you have a 30-pack-year history of smoking and currently smoke or have quit within the past 15 years.  Fecal occult blood test (FOBT) of the stool. You may have this test every year starting at age 43.  Flexible sigmoidoscopy or colonoscopy. You may have a sigmoidoscopy every 5 years or a colonoscopy every 10 years starting at age 28.  Hepatitis C blood test.  Hepatitis B blood test.  Sexually transmitted disease (STD) testing.  Diabetes screening. This is done by checking your blood sugar (glucose) after you have not eaten for a while (fasting). You may have this done every 1-3 years.  Bone density scan. This is done to screen for osteoporosis. You may have this done starting at age 69.  Mammogram. This may be done every 1-2 years. Talk to your health care provider about how often you should have regular mammograms. Talk with your health care provider about your test results, treatment options,  and if necessary, the need for more tests. Vaccines  Your health care provider may recommend certain vaccines, such as:  Influenza vaccine. This is recommended every year.  Tetanus, diphtheria, and acellular pertussis (Tdap, Td) vaccine. You  may need a Td booster every 10 years.  Zoster vaccine. You may need this after age 56.  Pneumococcal 13-valent conjugate (PCV13) vaccine. One dose is recommended after age 52.  Pneumococcal polysaccharide (PPSV23) vaccine. One dose is recommended after age 10. Talk to your health care provider about which screenings and vaccines you need and how often you need them. This information is not intended to replace advice given to you by your health care provider. Make sure you discuss any questions you have with your health care provider. Document Released: 08/16/2015 Document Revised: 04/08/2016 Document Reviewed: 05/21/2015 Elsevier Interactive Patient Education  2017 [place] Prevention in the Home Falls can cause injuries. They can happen to people of all ages. There are many things you can do to make your home safe and to help prevent falls. What can I do on the outside of my home?  Regularly fix the edges of walkways and driveways and fix any cracks.  Remove anything that might make you trip as you walk through a door, such as a raised step or threshold.  Trim any bushes or trees on the path to your home.  Use bright outdoor lighting.  Clear any walking paths of anything that might make someone trip, such as rocks or tools.  Regularly check to see if handrails are loose or broken. Make sure that both sides of any steps have handrails.  Any raised decks and porches should have guardrails on the edges.  Have any leaves, snow, or ice cleared regularly.  Use sand or salt on walking paths during winter.  Clean up any spills in your garage right away. This includes oil or grease spills. What can I do in the bathroom?  Use night lights.  Install grab bars by the toilet and in the tub and shower. Do not use towel bars as grab bars.  Use non-skid mats or decals in the tub or shower.  If you need to sit down in the shower, use a plastic, non-slip stool.  Keep the floor  dry. Clean up any water that spills on the floor as soon as it happens.  Remove soap buildup in the tub or shower regularly.  Attach bath mats securely with double-sided non-slip rug tape.  Do not have throw rugs and other things on the floor that can make you trip. What can I do in the bedroom?  Use night lights.  Make sure that you have a light by your bed that is easy to reach.  Do not use any sheets or blankets that are too big for your bed. They should not hang down onto the floor.  Have a firm chair that has side arms. You can use this for support while you get dressed.  Do not have throw rugs and other things on the floor that can make you trip. What can I do in the kitchen?  Clean up any spills right away.  Avoid walking on wet floors.  Keep items that you use a lot in easy-to-reach places.  If you need to reach something above you, use a strong step stool that has a grab bar.  Keep electrical cords out of the way.  Do not use floor polish or wax that makes floors slippery. If  you must use wax, use non-skid floor wax.  Do not have throw rugs and other things on the floor that can make you trip. What can I do with my stairs?  Do not leave any items on the stairs.  Make sure that there are handrails on both sides of the stairs and use them. Fix handrails that are broken or loose. Make sure that handrails are as long as the stairways.  Check any carpeting to make sure that it is firmly attached to the stairs. Fix any carpet that is loose or worn.  Avoid having throw rugs at the top or bottom of the stairs. If you do have throw rugs, attach them to the floor with carpet tape.  Make sure that you have a light switch at the top of the stairs and the bottom of the stairs. If you do not have them, ask someone to add them for you. What else can I do to help prevent falls?  Wear shoes that:  Do not have high heels.  Have rubber bottoms.  Are comfortable and fit you  well.  Are closed at the toe. Do not wear sandals.  If you use a stepladder:  Make sure that it is fully opened. Do not climb a closed stepladder.  Make sure that both sides of the stepladder are locked into place.  Ask someone to hold it for you, if possible.  Clearly mark and make sure that you can see:  Any grab bars or handrails.  First and last steps.  Where the edge of each step is.  Use tools that help you move around (mobility aids) if they are needed. These include:  Canes.  Walkers.  Scooters.  Crutches.  Turn on the lights when you go into a dark area. Replace any light bulbs as soon as they burn out.  Set up your furniture so you have a clear path. Avoid moving your furniture around.  If any of your floors are uneven, fix them.  If there are any pets around you, be aware of where they are.  Review your medicines with your doctor. Some medicines can make you feel dizzy. This can increase your chance of falling. Ask your doctor what other things that you can do to help prevent falls. This information is not intended to replace advice given to you by your health care provider. Make sure you discuss any questions you have with your health care provider. Document Released: 05/16/2009 Document Revised: 12/26/2015 Document Reviewed: 08/24/2014 Elsevier Interactive Patient Education  2017 [employer].

## 2017-10-20 NOTE — Progress Notes (Signed)
Subjective:   AZAELA CARACCI is a 77 y.o. female who presents for Medicare Annual (Subsequent) preventive examination.  Review of Systems:  Cardiac Risk Factors include: advanced age (>88mn, >>103women);dyslipidemia;obesity (BMI >30kg/m2)     Objective:     Vitals: BP 118/82 (BP Location: Left Arm)   Pulse 62   Temp 97.6 F (36.4 C) (Temporal)   Resp 16   Ht 4' 11"  (1.499 m)   Wt 173 lb 9.6 oz (78.7 kg)   BMI 35.06 kg/m   Body mass index is 35.06 kg/m.  Advanced Directives 10/20/2017  Does Patient Have a Medical Advance Directive? Yes  Type of [place]  Copy of [place] Chart? No - copy requested    Tobacco Social History   Tobacco Use  Smoking Status Never Smoker  Smokeless Tobacco Never Used     Counseling given: Not Answered   Clinical Intake:  Pre-visit preparation completed: Yes  Pain : No/denies pain     Nutritional Status: BMI > 30  Obese Nutritional Risks: None Diabetes: No  How often do you need to have someone help you when you read instructions, pamphlets, or other written materials from your doctor or pharmacy?: 1 - Never What is the last grade level you completed in school?: college degree  Interpreter Needed?: No  Information entered by :: Rondale Nies,LPN   Past Medical History:  Diagnosis Date  . Arthritis   . Hyperlipidemia   . Osteoporosis    Past Surgical History:  Procedure Laterality Date  . ABDOMINAL HYSTERECTOMY    . CESAREAN SECTION    . ILEOANAL RESERVOIR EXCISION W/ ILEOSTOMY     Family History  Problem Relation Age of Onset  . Cancer Mother   . Stroke Father   . Dementia Sister   . Breast cancer Cousin 754  Social History   Socioeconomic History  . Marital status: Divorced    Spouse name: None  . Number of children: None  . Years of education: None  . Highest education level: None  Social Needs  . Financial resource strain: Not  hard at all  . Food insecurity - worry: Never true  . Food insecurity - inability: Never true  . Transportation needs - medical: No  . Transportation needs - non-medical: No  Occupational History  . None  Tobacco Use  . Smoking status: Never Smoker  . Smokeless tobacco: Never Used  Substance and Sexual Activity  . Alcohol use: Yes    Comment: glass of wine occasionally   . Drug use: No  . Sexual activity: None  Other Topics Concern  . None  Social History Narrative  . None    Outpatient Encounter Medications as of 10/20/2017  Medication Sig  . aspirin EC 81 MG tablet Take 81 mg by mouth daily.  . Calcium Citrate-Vitamin D (CALCIUM + D PO) Take by mouth daily.  . Multiple Vitamin (MULTIVITAMIN) tablet Take 1 tablet by mouth daily.  . naproxen sodium (ALEVE) 220 MG tablet Take 220 mg by mouth.   No facility-administered encounter medications on file as of 10/20/2017.     Activities of Daily Living In your present state of health, do you have any difficulty performing the following activities: 10/20/2017  Hearing? N  Vision? N  Difficulty concentrating or making decisions? N  Walking or climbing stairs? Y  Dressing or bathing? N  Doing errands, shopping? N  Preparing Food and eating ?  N  Using the Toilet? N  In the past six months, have you accidently leaked urine? N  Do you have problems with loss of bowel control? N  Managing your Medications? N  Managing your Finances? N  Housekeeping or managing your Housekeeping? N  Some recent data might be hidden    Patient Care Team: Guadalupe Maple, MD as PCP - General (Family Medicine)    Assessment:   This is a routine wellness examination for Patina.  Exercise Activities and Dietary recommendations Current Exercise Habits: The patient does not participate in regular exercise at present, Exercise limited by: None identified  Goals    . DIET - INCREASE WATER INTAKE     Recommend drinking at least 6-8 glasses of water  a day        Fall Risk Fall Risk  10/20/2017 10/20/2016 10/15/2015  Falls in the past year? No No No   Is the patient's home free of loose throw rugs in walkways, pet beds, electrical cords, etc?   no      Grab bars in the bathroom? no      Handrails on the stairs?   yes      Adequate lighting?   yes  Timed Get Up and Go performed: Completed in 8 seconds with no use of assistive devices, steady gait. No intervention needed at this time.   Depression Screen PHQ 2/9 Scores 10/20/2017 10/20/2016 10/15/2015  PHQ - 2 Score 0 0 0     Cognitive Function     6CIT Screen 10/20/2017  What Year? 0 points  What month? 0 points  What time? 0 points  Count back from 20 0 points  Months in reverse 0 points  Repeat phrase 0 points  Total Score 0    Immunization History  Administered Date(s) Administered  . Pneumococcal-Unspecified 05/29/2009, 06/02/2010  . Td 05/29/2008  . Zoster 06/03/2010    Qualifies for Shingles Vaccine? Yes, discussed shingrix vaccine   Screening Tests Health Maintenance  Topic Date Due  . PNA vac Low Risk Adult (2 of 2 - PCV13) 10/20/2017 (Originally 06/03/2011)  . INFLUENZA VACCINE  06/22/2018 (Originally 03/03/2017)  . TETANUS/TDAP  05/29/2018  . DEXA SCAN  Completed    Cancer Screenings: Lung: Low Dose CT Chest recommended if Age 42-80 years, 30 pack-year currently smoking OR have quit w/in 15years. Patient does not qualify. Breast:  Up to date on Mammogram? Yes   Up to date of Bone Density/Dexa? Yes Colorectal: no longer required   Additional Screenings:  Hepatitis B/HIV/Syphillis: not indicated Hepatitis C Screening: not indicated      Plan:    I have personally reviewed and addressed the Medicare Annual Wellness questionnaire and have noted the following in the patient's chart:  A. Medical and social history B. Use of alcohol, tobacco or illicit drugs  C. Current medications and supplements D. Functional ability and status E.  Nutritional  status F.  Physical activity G. Advance directives H. List of other physicians I.  Hospitalizations, surgeries, and ER visits in previous 12 months J.  [place] such as hearing and vision if needed, cognitive and depression L. Referrals and appointments   In addition, I have reviewed and discussed with patient certain preventive protocols, quality metrics, and best practice recommendations. A written personalized care plan for preventive services as well as general preventive health recommendations were provided to patient.   Signed,  Tyler Aas, LPN Nurse Health Advisor   Nurse Notes:none

## 2017-10-20 NOTE — Addendum Note (Signed)
Addended by: Gerda Diss A on: 10/20/2017 04:03 PM   Modules accepted: Orders

## 2017-10-21 LAB — CBC WITH DIFFERENTIAL/PLATELET
Basophils Absolute: 0 10*3/uL (ref 0.0–0.2)
Basos: 1 %
EOS (ABSOLUTE): 0 10*3/uL (ref 0.0–0.4)
EOS: 1 %
HEMATOCRIT: 40.9 % (ref 34.0–46.6)
Hemoglobin: 14.1 g/dL (ref 11.1–15.9)
Immature Grans (Abs): 0 10*3/uL (ref 0.0–0.1)
Immature Granulocytes: 0 %
LYMPHS ABS: 1.4 10*3/uL (ref 0.7–3.1)
Lymphs: 40 %
MCH: 29.7 pg (ref 26.6–33.0)
MCHC: 34.5 g/dL (ref 31.5–35.7)
MCV: 86 fL (ref 79–97)
MONOS ABS: 0.3 10*3/uL (ref 0.1–0.9)
Monocytes: 8 %
Neutrophils Absolute: 1.9 10*3/uL (ref 1.4–7.0)
Neutrophils: 50 %
Platelets: 253 10*3/uL (ref 150–379)
RBC: 4.75 x10E6/uL (ref 3.77–5.28)
RDW: 13 % (ref 12.3–15.4)
WBC: 3.6 10*3/uL (ref 3.4–10.8)

## 2017-10-21 LAB — COMPREHENSIVE METABOLIC PANEL
ALK PHOS: 54 IU/L (ref 39–117)
ALT: 16 IU/L (ref 0–32)
AST: 22 IU/L (ref 0–40)
Albumin/Globulin Ratio: 1.8 (ref 1.2–2.2)
Albumin: 4.2 g/dL (ref 3.5–4.8)
BILIRUBIN TOTAL: 0.9 mg/dL (ref 0.0–1.2)
BUN/Creatinine Ratio: 15 (ref 12–28)
BUN: 13 mg/dL (ref 8–27)
CO2: 23 mmol/L (ref 20–29)
Calcium: 9.4 mg/dL (ref 8.7–10.3)
Chloride: 103 mmol/L (ref 96–106)
Creatinine, Ser: 0.88 mg/dL (ref 0.57–1.00)
GFR calc Af Amer: 74 mL/min/{1.73_m2} (ref >59)
GFR calc non Af Amer: 64 mL/min/{1.73_m2} (ref >59)
GLOBULIN, TOTAL: 2.4 g/dL (ref 1.5–4.5)
Glucose: 91 mg/dL (ref 65–99)
Potassium: 4.5 mmol/L (ref 3.5–5.2)
SODIUM: 140 mmol/L (ref 134–144)
Total Protein: 6.6 g/dL (ref 6.0–8.5)

## 2017-10-21 LAB — UA/M W/RFLX CULTURE, ROUTINE

## 2017-10-21 LAB — LIPID PANEL W/O CHOL/HDL RATIO
CHOLESTEROL TOTAL: 224 mg/dL — AB (ref 100–199)
HDL: 80 mg/dL (ref >39)
LDL Calculated: 128 mg/dL — ABNORMAL HIGH (ref 0–99)
Triglycerides: 81 mg/dL (ref 0–149)
VLDL Cholesterol Cal: 16 mg/dL (ref 5–40)

## 2017-10-21 LAB — TSH: TSH: 0.748 u[IU]/mL (ref 0.450–4.500)

## 2017-10-25 ENCOUNTER — Ambulatory Visit (INDEPENDENT_AMBULATORY_CARE_PROVIDER_SITE_OTHER): Payer: Medicare Other | Admitting: Family Medicine

## 2017-10-25 ENCOUNTER — Encounter: Payer: Self-pay | Admitting: Family Medicine

## 2017-10-25 VITALS — BP 138/82 | HR 80 | Temp 97.6°F | Wt 175.4 lb

## 2017-10-25 DIAGNOSIS — Z7189 Other specified counseling: Secondary | ICD-10-CM | POA: Diagnosis not present

## 2017-10-25 DIAGNOSIS — K51918 Ulcerative colitis, unspecified with other complication: Secondary | ICD-10-CM | POA: Diagnosis not present

## 2017-10-25 DIAGNOSIS — M170 Bilateral primary osteoarthritis of knee: Secondary | ICD-10-CM

## 2017-10-25 DIAGNOSIS — Z0000 Encounter for general adult medical examination without abnormal findings: Secondary | ICD-10-CM

## 2017-10-25 DIAGNOSIS — Z932 Ileostomy status: Secondary | ICD-10-CM

## 2017-10-25 MED ORDER — CIPROFLOXACIN HCL 500 MG PO TABS: 500.0000 mg | ORAL_TABLET | Freq: Two times a day (BID) | ORAL | 0 refills | Status: DC

## 2017-10-25 NOTE — Assessment & Plan Note (Signed)
A voluntary discussion about advance care planning including the explanation and discussion of advance directives was extensively discussed  with the patient.  Explanation about the health care proxy and Living will was reviewed and packet with forms with explanation of how to fill them out was given.  Time spent: encounter 16+ min       Individuals present: Pt.

## 2017-10-25 NOTE — Assessment & Plan Note (Signed)
The current medical regimen is effective;  continue present plan and medications.  

## 2017-10-25 NOTE — Assessment & Plan Note (Signed)
Stable with inflammatory  changes of her pouch will give Cipro 500 twice daily

## 2017-10-25 NOTE — Progress Notes (Signed)
BP 138/82 (BP Location: Left Arm, Patient Position: Sitting, Cuff Size: Large)   Pulse 80   Temp 97.6 F (36.4 C) (Oral)   Wt 175 lb 6 oz (79.5 kg)   SpO2 98%   BMI 35.42 kg/m    Subjective:    Patient ID: Victoria Peterson, female    DOB: 09/10/1940, 77 y.o.   MRN: [id number]  HPI: Victoria Peterson is a 77 y.o. female  Annual exam Other medical conditions stable. Knee arthritis still bothersome but otherwise doing well.  Using occasional Aleve Patient with usual springtime pouch-itis has responded to Cipro in the past and needs a refill. Other medical conditions stable.   Relevant past medical, surgical, family and social history reviewed and updated as indicated. Interim medical history since our last visit reviewed. Allergies and medications reviewed and updated.  Review of Systems  Constitutional: Negative.   HENT: Negative.   Eyes: Negative.   Respiratory: Negative.   Cardiovascular: Negative.   Gastrointestinal: Negative.   Endocrine: Negative.   Genitourinary: Negative.   Musculoskeletal: Negative.   Skin: Negative.   Allergic/Immunologic: Negative.   Neurological: Negative.   Hematological: Negative.   Psychiatric/Behavioral: Negative.     Per HPI unless specifically indicated above     Objective:    BP 138/82 (BP Location: Left Arm, Patient Position: Sitting, Cuff Size: Large)   Pulse 80   Temp 97.6 F (36.4 C) (Oral)   Wt 175 lb 6 oz (79.5 kg)   SpO2 98%   BMI 35.42 kg/m   Wt Readings from Last 3 Encounters:  10/25/17 175 lb 6 oz (79.5 kg)  10/20/17 173 lb 9.6 oz (78.7 kg)  10/20/16 176 lb 3.2 oz (79.9 kg)    Physical Exam  Constitutional: She is oriented to person, place, and time. She appears well-developed and well-nourished.  HENT:  Head: Normocephalic and atraumatic.  Right Ear: External ear normal.  Left Ear: External ear normal.  Nose: Nose normal.  Mouth/Throat: Oropharynx is clear and moist.  Eyes: Pupils are equal, round, and  reactive to light. Conjunctivae and EOM are normal.  Neck: Normal range of motion. Neck supple. Carotid bruit is not present.  Cardiovascular: Normal rate, regular rhythm and normal heart sounds.  No murmur heard. Pulmonary/Chest: Effort normal and breath sounds normal. She exhibits no mass. Right breast exhibits no mass, no skin change and no tenderness. Left breast exhibits no mass, no skin change and no tenderness. Breasts are symmetrical.  Abdominal: Soft. Bowel sounds are normal. There is no hepatosplenomegaly.  Musculoskeletal: Normal range of motion.  Neurological: She is alert and oriented to person, place, and time.  Skin: No rash noted.  Psychiatric: She has a normal mood and affect. Her behavior is normal. Judgment and thought content normal.    Results for orders placed or performed in visit on 10/20/17  Microscopic Examination  Result Value Ref Range   WBC, UA >30 (A) 0 - 5 /hpf   RBC, UA 3-10 (A) 0 - 2 /hpf   Epithelial Cells (non renal) 0-10 0 - 10 /hpf   Crystals Present N/A   Crystal Type Calcium Oxalate N/A   Mucus, UA Present Not Estab.   Bacteria, UA Moderate (A) None seen/Few  CBC with Differential  Result Value Ref Range   WBC 3.6 3.4 - 10.8 x10E3/uL   RBC 4.75 3.77 - 5.28 x10E6/uL   Hemoglobin 14.1 11.1 - 15.9 g/dL   Hematocrit 40.9 34.0 - 46.6 %   MCV  86 79 - 97 fL   MCH 29.7 26.6 - 33.0 pg   MCHC 34.5 31.5 - 35.7 g/dL   RDW 13.0 12.3 - 15.4 %   Platelets 253 150 - 379 x10E3/uL   Neutrophils 50 Not Estab. %   Lymphs 40 Not Estab. %   Monocytes 8 Not Estab. %   Eos 1 Not Estab. %   Basos 1 Not Estab. %   Neutrophils Absolute 1.9 1.4 - 7.0 x10E3/uL   Lymphocytes Absolute 1.4 0.7 - 3.1 x10E3/uL   Monocytes Absolute 0.3 0.1 - 0.9 x10E3/uL   EOS (ABSOLUTE) 0.0 0.0 - 0.4 x10E3/uL   Basophils Absolute 0.0 0.0 - 0.2 x10E3/uL   Immature Granulocytes 0 Not Estab. %   Immature Grans (Abs) 0.0 0.0 - 0.1 x10E3/uL  Comp Met (CMET)  Result Value Ref Range    Glucose 91 65 - 99 mg/dL   BUN 13 8 - 27 mg/dL   Creatinine, Ser 0.88 0.57 - 1.00 mg/dL   GFR calc non Af Amer 64 >59 mL/min/1.73   GFR calc Af Amer 74 >59 mL/min/1.73   BUN/Creatinine Ratio 15 12 - 28   Sodium 140 134 - 144 mmol/L   Potassium 4.5 3.5 - 5.2 mmol/L   Chloride 103 96 - 106 mmol/L   CO2 23 20 - 29 mmol/L   Calcium 9.4 8.7 - 10.3 mg/dL   Total Protein 6.6 6.0 - 8.5 g/dL   Albumin 4.2 3.5 - 4.8 g/dL   Globulin, Total 2.4 1.5 - 4.5 g/dL   Albumin/Globulin Ratio 1.8 1.2 - 2.2   Bilirubin Total 0.9 0.0 - 1.2 mg/dL   Alkaline Phosphatase 54 39 - 117 IU/L   AST 22 0 - 40 IU/L   ALT 16 0 - 32 IU/L  TSH  Result Value Ref Range   TSH 0.748 0.450 - 4.500 uIU/mL  Urinalysis, Routine w reflex microscopic  Result Value Ref Range   Specific Gravity, UA 1.015 1.005 - 1.030   pH, UA 5.0 5.0 - 7.5   Color, UA Yellow Yellow   Appearance Ur Cloudy (A) Clear   Leukocytes, UA 2+ (A) Negative   Protein, UA 1+ (A) Negative/Trace   Glucose, UA Negative Negative   Ketones, UA Negative Negative   RBC, UA 3+ (A) Negative   Bilirubin, UA Negative Negative   Urobilinogen, Ur 0.2 0.2 - 1.0 mg/dL   Nitrite, UA Negative Negative   Microscopic Examination See below:   Lipid Panel w/o Chol/HDL Ratio  Result Value Ref Range   Cholesterol, Total 224 (H) 100 - 199 mg/dL   Triglycerides 81 0 - 149 mg/dL   HDL 80 >39 mg/dL   VLDL Cholesterol Cal 16 5 - 40 mg/dL   LDL Calculated 128 (H) 0 - 99 mg/dL  UA/M w/rflx Culture, Routine  Result Value Ref Range   Specific Gravity, UA CANCELED    pH, UA CANCELED    Protein, UA CANCELED    Glucose, UA CANCELED    Ketones, UA CANCELED       Assessment & Plan:   Problem List Items Addressed This Visit      Digestive   Severe chronic ulcerative colitis (HCC)    Stable with inflammatory  changes of her pouch will give Cipro 500 twice daily        Musculoskeletal and Integument   Arthritis of both knees    The current medical regimen is  effective;  continue present plan and medications.  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  Explanation about the health care proxy and Living will was reviewed and packet with forms with explanation of how to fill them out was given.  Time spent: encounter 16+ min       Individuals present: Pt.           Follow up plan: Return in about 1 year (around 10/26/2018) for Physical Exam.

## 2017-10-25 NOTE — Assessment & Plan Note (Signed)
Pouch stable inflamed

## 2018-03-03 ENCOUNTER — Encounter: Payer: Self-pay | Admitting: Physician Assistant

## 2018-03-03 ENCOUNTER — Ambulatory Visit: Payer: Self-pay | Admitting: Physician Assistant

## 2018-03-03 ENCOUNTER — Ambulatory Visit (INDEPENDENT_AMBULATORY_CARE_PROVIDER_SITE_OTHER): Payer: Medicare Other | Admitting: Physician Assistant

## 2018-03-03 VITALS — BP 143/78 | HR 92 | Temp 99.7°F | Wt 175.2 lb

## 2018-03-03 DIAGNOSIS — G6289 Other specified polyneuropathies: Secondary | ICD-10-CM

## 2018-03-03 NOTE — Progress Notes (Signed)
Subjective:    Patient ID: Victoria Peterson, female    DOB: July 24, 1941, 77 y.o.   MRN: [id number]  Victoria Peterson is a 77 y.o. female presenting on 03/03/2018 for Numbness (bilateral hands)   HPI   Victoria Peterson has a history of colectomy due to ulcerative colitis presents today with two weeks of bilateral numbness and tingling in her hands. She reports it is all of her fingers. She reports she is taking a B12 complex. May have history of carpal tunnel syndrome. No recent neck pain, injuries. Never had issues with thyroid, TSH 10/20/2017 normal range. She is starting to drop things.   Social History   Tobacco Use  . Smoking status: Never Smoker  . Smokeless tobacco: Never Used  Substance Use Topics  . Alcohol use: Yes    Comment: glass of wine occasionally   . Drug use: No    Review of Systems Per HPI unless specifically indicated above     Objective:    BP (!) 143/78 (BP Location: Left Arm, Patient Position: Sitting, Cuff Size: Normal)   Pulse 92   Temp 99.7 F (37.6 C)   Wt 175 lb 4 oz (79.5 kg)   SpO2 98%   BMI 35.40 kg/m   Wt Readings from Last 3 Encounters:  03/03/18 175 lb 4 oz (79.5 kg)  10/25/17 175 lb 6 oz (79.5 kg)  10/20/17 173 lb 9.6 oz (78.7 kg)    Physical Exam  Constitutional: She is oriented to person, place, and time. She appears well-developed and well-nourished.  Cardiovascular: Normal rate and regular rhythm.  Cap refill < 3s in bilateral hands.   Pulmonary/Chest: Effort normal and breath sounds normal.  Musculoskeletal:  5/5 grip strength in hands bilaterally. Phalen's negative both hands.   Neurological: She is alert and oriented to person, place, and time. She has normal strength. No sensory deficit.  Sensation grossly intact to light touch in bilateral hands.   Skin: Skin is warm and dry.  Psychiatric: She has a normal mood and affect. Her behavior is normal.   Results for orders placed or performed in visit on 03/03/18  B12  Result  Value Ref Range   Vitamin B-12 >2000 (H) 232 - 1245 pg/mL  HgB A1c  Result Value Ref Range   Hgb A1c MFr Bld 5.7 (H) 4.8 - 5.6 %   Est. average glucose Bld gHb Est-mCnc 117 mg/dL  CBC with Differential  Result Value Ref Range   WBC 5.2 3.4 - 10.8 x10E3/uL   RBC 4.95 3.77 - 5.28 x10E6/uL   Hemoglobin 13.8 11.1 - 15.9 g/dL   Hematocrit 43.0 34.0 - 46.6 %   MCV 87 79 - 97 fL   MCH 27.9 26.6 - 33.0 pg   MCHC 32.1 31.5 - 35.7 g/dL   RDW 14.2 12.3 - 15.4 %   Platelets 248 150 - 450 x10E3/uL   Neutrophils 52 Not Estab. %   Lymphs 37 Not Estab. %   Monocytes 9 Not Estab. %   Eos 1 Not Estab. %   Basos 1 Not Estab. %   Neutrophils Absolute 2.7 1.4 - 7.0 x10E3/uL   Lymphocytes Absolute 1.9 0.7 - 3.1 x10E3/uL   Monocytes Absolute 0.5 0.1 - 0.9 x10E3/uL   EOS (ABSOLUTE) 0.1 0.0 - 0.4 x10E3/uL   Basophils Absolute 0.0 0.0 - 0.2 x10E3/uL   Immature Granulocytes 0 Not Estab. %   Immature Grans (Abs) 0.0 0.0 - 0.1 x10E3/uL  Assessment & Plan:   1. Other polyneuropathy  May be at risk for B12 deficiency due to total colectomy, will check as below. If labs normal, consider neurology referral for further testing.   - B12 - HgB A1c - CBC with Differential    Follow up plan: Return if symptoms worsen or fail to improve.  Carles Collet, PA-C [facility] 03/04/2018, 12:02 PM

## 2018-03-03 NOTE — Patient Instructions (Signed)
Peripheral Neuropathy Peripheral neuropathy is a type of nerve damage. It affects nerves that carry signals between the spinal cord and other parts of the body. These are called peripheral nerves. With peripheral neuropathy, one nerve or a group of nerves may be damaged. What are the causes? Many things can damage peripheral nerves. For some people with peripheral neuropathy, the cause is unknown. Some causes include:  Diabetes. This is the most common cause of peripheral neuropathy.  Injury to a nerve.  Pressure or stress on a nerve that lasts a long time.  Too little vitamin B. Alcoholism can lead to this.  Infections.  Autoimmune diseases, such as multiple sclerosis and systemic lupus erythematosus.  Inherited nerve diseases.  Some medicines, such as cancer drugs.  Toxic substances, such as lead and mercury.  Too little blood flowing to the legs.  Kidney disease.  Thyroid disease.  What are the signs or symptoms? Different people have different symptoms. The symptoms you have will depend on which of your nerves is damaged. Common symptoms include:  Loss of feeling (numbness) in the feet and hands.  Tingling in the feet and hands.  Pain that burns.  Very sensitive skin.  Weakness.  Not being able to move a part of the body (paralysis).  Muscle twitching.  Clumsiness or poor coordination.  Loss of balance.  Not being able to control your bladder.  Feeling dizzy.  Sexual problems.  How is this diagnosed? Peripheral neuropathy is a symptom, not a disease. Finding the cause of peripheral neuropathy can be hard. To figure that out, your health care provider will take a medical history and do a physical exam. A neurological exam will also be done. This involves checking things affected by your brain, spinal cord, and nerves (nervous system). For example, your health care provider will check your reflexes, how you move, and what you can feel. Other types of tests  may also be ordered, such as:  Blood tests.  A test of the fluid in your spinal cord.  Imaging tests, such as CT scans or an MRI.  Electromyography (EMG). This test checks the nerves that control muscles.  Nerve conduction velocity tests. These tests check how fast messages pass through your nerves.  Nerve biopsy. A small piece of nerve is removed. It is then checked under a microscope.  How is this treated?  Medicine is often used to treat peripheral neuropathy. Medicines may include: ? Pain-relieving medicines. Prescription or over-the-counter medicine may be suggested. ? Antiseizure medicine. This may be used for pain. ? Antidepressants. These also may help ease pain from neuropathy. ? Lidocaine. This is a numbing medicine. You might wear a patch or be given a shot. ? Mexiletine. This medicine is typically used to help control irregular heart rhythms.  Surgery. Surgery may be needed to relieve pressure on a nerve or to destroy a nerve that is causing pain.  Physical therapy to help movement.  Assistive devices to help movement. Follow these instructions at home:  Only take over-the-counter or prescription medicines as directed by your health care provider. Follow the instructions carefully for any given medicines. Do not take any other medicines without first getting approval from your health care provider.  If you have diabetes, work closely with your health care provider to keep your blood sugar under control.  If you have numbness in your feet: ? Check every day for signs of injury or infection. Watch for redness, warmth, and swelling. ? Wear padded socks and comfortable  shoes. These help protect your feet.  Do not do things that put pressure on your damaged nerve.  Do not smoke. Smoking keeps blood from getting to damaged nerves.  Avoid or limit alcohol. Too much alcohol can cause a lack of B vitamins. These vitamins are needed for healthy nerves.  Develop a good  support system. Coping with peripheral neuropathy can be stressful. Talk to a mental health specialist or join a support group if you are struggling.  Follow up with your health care provider as directed. Contact a health care provider if:  You have new signs or symptoms of peripheral neuropathy.  You are struggling emotionally from dealing with peripheral neuropathy.  You have a fever. Get help right away if:  You have an injury or infection that is not healing.  You feel very dizzy or begin vomiting.  You have chest pain.  You have trouble breathing. This information is not intended to replace advice given to you by your health care provider. Make sure you discuss any questions you have with your health care provider. Document Released: 07/10/2002 Document Revised: 12/26/2015 Document Reviewed: 03/27/2013 Elsevier Interactive Patient Education  2017 [employer].

## 2018-03-04 ENCOUNTER — Other Ambulatory Visit: Payer: Self-pay | Admitting: Physician Assistant

## 2018-03-04 DIAGNOSIS — G6289 Other specified polyneuropathies: Secondary | ICD-10-CM

## 2018-03-04 LAB — CBC WITH DIFFERENTIAL/PLATELET
Basophils Absolute: 0 10*3/uL (ref 0.0–0.2)
Basos: 1 %
EOS (ABSOLUTE): 0.1 10*3/uL (ref 0.0–0.4)
Eos: 1 %
Hematocrit: 43 % (ref 34.0–46.6)
Hemoglobin: 13.8 g/dL (ref 11.1–15.9)
Immature Grans (Abs): 0 10*3/uL (ref 0.0–0.1)
Immature Granulocytes: 0 %
Lymphocytes Absolute: 1.9 10*3/uL (ref 0.7–3.1)
Lymphs: 37 %
MCH: 27.9 pg (ref 26.6–33.0)
MCHC: 32.1 g/dL (ref 31.5–35.7)
MCV: 87 fL (ref 79–97)
Monocytes Absolute: 0.5 10*3/uL (ref 0.1–0.9)
Monocytes: 9 %
Neutrophils Absolute: 2.7 10*3/uL (ref 1.4–7.0)
Neutrophils: 52 %
Platelets: 248 10*3/uL (ref 150–450)
RBC: 4.95 x10E6/uL (ref 3.77–5.28)
RDW: 14.2 % (ref 12.3–15.4)
WBC: 5.2 10*3/uL (ref 3.4–10.8)

## 2018-03-04 LAB — HEMOGLOBIN A1C
Est. average glucose Bld gHb Est-mCnc: 117 mg/dL
Hgb A1c MFr Bld: 5.7 % — ABNORMAL HIGH (ref 4.8–5.6)

## 2018-03-04 LAB — VITAMIN B12: Vitamin B-12: >2000 pg/mL — ABNORMAL HIGH (ref 232–1245)

## 2018-05-05 DIAGNOSIS — R202 Paresthesia of skin: Secondary | ICD-10-CM | POA: Diagnosis not present

## 2018-05-05 DIAGNOSIS — R200 Anesthesia of skin: Secondary | ICD-10-CM | POA: Diagnosis not present

## 2018-05-10 DIAGNOSIS — M170 Bilateral primary osteoarthritis of knee: Secondary | ICD-10-CM | POA: Diagnosis not present

## 2018-06-14 DIAGNOSIS — R200 Anesthesia of skin: Secondary | ICD-10-CM | POA: Diagnosis not present

## 2018-06-14 DIAGNOSIS — R202 Paresthesia of skin: Secondary | ICD-10-CM | POA: Diagnosis not present

## 2018-06-21 DIAGNOSIS — R200 Anesthesia of skin: Secondary | ICD-10-CM | POA: Diagnosis not present

## 2018-06-21 DIAGNOSIS — R202 Paresthesia of skin: Secondary | ICD-10-CM | POA: Diagnosis not present

## 2018-07-06 DIAGNOSIS — G5603 Carpal tunnel syndrome, bilateral upper limbs: Secondary | ICD-10-CM | POA: Diagnosis not present

## 2018-09-06 DIAGNOSIS — G5603 Carpal tunnel syndrome, bilateral upper limbs: Secondary | ICD-10-CM | POA: Diagnosis not present

## 2018-09-08 ENCOUNTER — Other Ambulatory Visit: Payer: Self-pay | Admitting: Family Medicine

## 2018-09-08 DIAGNOSIS — Z1231 Encounter for screening mammogram for malignant neoplasm of breast: Secondary | ICD-10-CM

## 2018-10-20 ENCOUNTER — Telehealth: Payer: Self-pay

## 2018-10-20 NOTE — Telephone Encounter (Signed)
Called to confirm medicare wellness visit appointment for 10/26/2018 at 3:15pm at [facility]. Patient was also informed the visit will be in the back building at [facility], provided with directions. Patient confirmed appt and verbalized understanding of new location. patient informed to call back if needing to cancel or reschedule

## 2018-10-24 ENCOUNTER — Telehealth: Payer: Self-pay | Admitting: Family Medicine

## 2018-10-24 NOTE — Telephone Encounter (Signed)
Spoke with patient by phone and cancelled her AWV appointment with the [facility] on 10/26/2018 at 3:15 PM.  Patient states she is willing to do AWV telephonically if the office decides to do it.  Janace Hoard, Care Guide.

## 2018-10-26 ENCOUNTER — Ambulatory Visit: Payer: Self-pay

## 2018-10-26 ENCOUNTER — Ambulatory Visit: Payer: Medicare Other

## 2018-11-02 ENCOUNTER — Ambulatory Visit: Payer: Self-pay

## 2018-11-02 ENCOUNTER — Encounter: Payer: Medicare Other | Admitting: Family Medicine

## 2018-12-28 ENCOUNTER — Ambulatory Visit: Payer: Self-pay

## 2019-02-01 ENCOUNTER — Other Ambulatory Visit: Payer: Self-pay

## 2019-02-01 ENCOUNTER — Ambulatory Visit (INDEPENDENT_AMBULATORY_CARE_PROVIDER_SITE_OTHER): Payer: Medicare Other

## 2019-02-01 ENCOUNTER — Encounter: Payer: Self-pay | Admitting: Nurse Practitioner

## 2019-02-01 ENCOUNTER — Ambulatory Visit (INDEPENDENT_AMBULATORY_CARE_PROVIDER_SITE_OTHER): Payer: Medicare Other | Admitting: Nurse Practitioner

## 2019-02-01 ENCOUNTER — Encounter: Payer: Self-pay | Admitting: Family Medicine

## 2019-02-01 VITALS — BP 116/74 | HR 86 | Temp 98.1°F | Ht 59.0 in | Wt 174.9 lb

## 2019-02-01 VITALS — BP 116/74 | HR 86 | Temp 98.1°F | Resp 15 | Ht 59.0 in | Wt 174.9 lb

## 2019-02-01 DIAGNOSIS — K51918 Ulcerative colitis, unspecified with other complication: Secondary | ICD-10-CM | POA: Diagnosis not present

## 2019-02-01 DIAGNOSIS — E559 Vitamin D deficiency, unspecified: Secondary | ICD-10-CM

## 2019-02-01 DIAGNOSIS — R7303 Prediabetes: Secondary | ICD-10-CM

## 2019-02-01 DIAGNOSIS — Z0000 Encounter for general adult medical examination without abnormal findings: Secondary | ICD-10-CM

## 2019-02-01 NOTE — Assessment & Plan Note (Signed)
Stable, no current medications.  CBC and CMP today.

## 2019-02-01 NOTE — Patient Instructions (Signed)

## 2019-02-01 NOTE — Assessment & Plan Note (Signed)
Check A1C and focus on diet.

## 2019-02-01 NOTE — Patient Instructions (Signed)
Victoria Peterson , Thank you for taking time to come for your Medicare Wellness Visit. I appreciate your ongoing commitment to your health goals. Please review the following plan we discussed and let me know if I can assist you in the future.   Screening recommendations/referrals: Colonoscopy: no longer required  Mammogram: no longer required  Bone Density: completed  Recommended yearly ophthalmology/optometry visit for glaucoma screening and checkup Recommended yearly dental visit for hygiene and checkup  Vaccinations: Influenza vaccine: due 04/2019 Pneumococcal vaccine: completed series  Tdap vaccine: due, check with your insurance company for coverage  Shingles vaccine: shingrix eligible check with your insurance company for coverage     Advanced directives: Please bring a copy of your health care power of attorney and living will to the office at your convenience.  Conditions/risks identified: none  Next appointment: Follow up in one year for your annual wellness exam.    Preventive Care 65 Years and Older, Female Preventive care refers to lifestyle choices and visits with your health care provider that can promote health and wellness. What does preventive care include?  A yearly physical exam. This is also called an annual well check.  Dental exams once or twice a year.  Routine eye exams. Ask your health care provider how often you should have your eyes checked.  Personal lifestyle choices, including:  Daily care of your teeth and gums.  Regular physical activity.  Eating a healthy diet.  Avoiding tobacco and drug use.  Limiting alcohol use.  Practicing safe sex.  Taking low-dose aspirin every day.  Taking vitamin and mineral supplements as recommended by your health care provider. What happens during an annual well check? The services and screenings done by your health care provider during your annual well check will depend on your age, overall health, lifestyle  risk factors, and family history of disease. Counseling  Your health care provider may ask you questions about your:  Alcohol use.  Tobacco use.  Drug use.  Emotional well-being.  Home and relationship well-being.  Sexual activity.  Eating habits.  History of falls.  Memory and ability to understand (cognition).  Work and work Statistician.  Reproductive health. Screening  You may have the following tests or measurements:  Height, weight, and BMI.  Blood pressure.  Lipid and cholesterol levels. These may be checked every 5 years, or more frequently if you are over 4 years old.  Skin check.  Lung cancer screening. You may have this screening every year starting at age 76 if you have a 30-pack-year history of smoking and currently smoke or have quit within the past 15 years.  Fecal occult blood test (FOBT) of the stool. You may have this test every year starting at age 76.  Flexible sigmoidoscopy or colonoscopy. You may have a sigmoidoscopy every 5 years or a colonoscopy every 10 years starting at age 76.  Hepatitis C blood test.  Hepatitis B blood test.  Sexually transmitted disease (STD) testing.  Diabetes screening. This is done by checking your blood sugar (glucose) after you have not eaten for a while (fasting). You may have this done every 1-3 years.  Bone density scan. This is done to screen for osteoporosis. You may have this done starting at age 76.  Mammogram. This may be done every 1-2 years. Talk to your health care provider about how often you should have regular mammograms. Talk with your health care provider about your test results, treatment options, and if necessary, the need for more  tests. Vaccines  Your health care provider may recommend certain vaccines, such as:  Influenza vaccine. This is recommended every year.  Tetanus, diphtheria, and acellular pertussis (Tdap, Td) vaccine. You may need a Td booster every 10 years.  Zoster vaccine.  You may need this after age 26.  Pneumococcal 13-valent conjugate (PCV13) vaccine. One dose is recommended after age 64.  Pneumococcal polysaccharide (PPSV23) vaccine. One dose is recommended after age 64. Talk to your health care provider about which screenings and vaccines you need and how often you need them. This information is not intended to replace advice given to you by your health care provider. Make sure you discuss any questions you have with your health care provider. Document Released: 08/16/2015 Document Revised: 04/08/2016 Document Reviewed: 05/21/2015 Elsevier Interactive Patient Education  2017 [employer] Prevention in the Home Falls can cause injuries. They can happen to people of all ages. There are many things you can do to make your home safe and to help prevent falls. What can I do on the outside of my home?  Regularly fix the edges of walkways and driveways and fix any cracks.  Remove anything that might make you trip as you walk through a door, such as a raised step or threshold.  Trim any bushes or trees on the path to your home.  Use bright outdoor lighting.  Clear any walking paths of anything that might make someone trip, such as rocks or tools.  Regularly check to see if handrails are loose or broken. Make sure that both sides of any steps have handrails.  Any raised decks and porches should have guardrails on the edges.  Have any leaves, snow, or ice cleared regularly.  Use sand or salt on walking paths during winter.  Clean up any spills in your garage right away. This includes oil or grease spills. What can I do in the bathroom?  Use night lights.  Install grab bars by the toilet and in the tub and shower. Do not use towel bars as grab bars.  Use non-skid mats or decals in the tub or shower.  If you need to sit down in the shower, use a plastic, non-slip stool.  Keep the floor dry. Clean up any water that spills on the floor as soon  as it happens.  Remove soap buildup in the tub or shower regularly.  Attach bath mats securely with double-sided non-slip rug tape.  Do not have throw rugs and other things on the floor that can make you trip. What can I do in the bedroom?  Use night lights.  Make sure that you have a light by your bed that is easy to reach.  Do not use any sheets or blankets that are too big for your bed. They should not hang down onto the floor.  Have a firm chair that has side arms. You can use this for support while you get dressed.  Do not have throw rugs and other things on the floor that can make you trip. What can I do in the kitchen?  Clean up any spills right away.  Avoid walking on wet floors.  Keep items that you use a lot in easy-to-reach places.  If you need to reach something above you, use a strong step stool that has a grab bar.  Keep electrical cords out of the way.  Do not use floor polish or wax that makes floors slippery. If you must use wax, use non-skid floor  wax.  Do not have throw rugs and other things on the floor that can make you trip. What can I do with my stairs?  Do not leave any items on the stairs.  Make sure that there are handrails on both sides of the stairs and use them. Fix handrails that are broken or loose. Make sure that handrails are as long as the stairways.  Check any carpeting to make sure that it is firmly attached to the stairs. Fix any carpet that is loose or worn.  Avoid having throw rugs at the top or bottom of the stairs. If you do have throw rugs, attach them to the floor with carpet tape.  Make sure that you have a light switch at the top of the stairs and the bottom of the stairs. If you do not have them, ask someone to add them for you. What else can I do to help prevent falls?  Wear shoes that:  Do not have high heels.  Have rubber bottoms.  Are comfortable and fit you well.  Are closed at the toe. Do not wear sandals.  If  you use a stepladder:  Make sure that it is fully opened. Do not climb a closed stepladder.  Make sure that both sides of the stepladder are locked into place.  Ask someone to hold it for you, if possible.  Clearly mark and make sure that you can see:  Any grab bars or handrails.  First and last steps.  Where the edge of each step is.  Use tools that help you move around (mobility aids) if they are needed. These include:  Canes.  Walkers.  Scooters.  Crutches.  Turn on the lights when you go into a dark area. Replace any light bulbs as soon as they burn out.  Set up your furniture so you have a clear path. Avoid moving your furniture around.  If any of your floors are uneven, fix them.  If there are any pets around you, be aware of where they are.  Review your medicines with your doctor. Some medicines can make you feel dizzy. This can increase your chance of falling. Ask your doctor what other things that you can do to help prevent falls. This information is not intended to replace advice given to you by your health care provider. Make sure you discuss any questions you have with your health care provider. Document Released: 05/16/2009 Document Revised: 12/26/2015 Document Reviewed: 08/24/2014 Elsevier Interactive Patient Education  2017 [employer].

## 2019-02-01 NOTE — Progress Notes (Signed)
Subjective:   Victoria Peterson is a 78 y.o. female who presents for Medicare Annual (Subsequent) preventive examination.  Review of Systems:   Cardiac Risk Factors include: advanced age (>78mn, >>15women);dyslipidemia;hypertension     Objective:     Vitals: BP 116/74 (BP Location: Left Arm, Patient Position: Sitting)   Pulse 86   Temp 98.1 F (36.7 C) (Temporal)   Resp 15   Ht 4' 11"  (1.499 m)   Wt 174 lb 14.4 oz (79.3 kg)   BMI 35.33 kg/m   Body mass index is 35.33 kg/m.  Advanced Directives 02/01/2019 10/20/2017  Does Patient Have a Medical Advance Directive? Yes Yes  Type of Advance Directive Living will;[place]care [place]  Copy of [place] in [place]? No - copy requested No - copy requested    Tobacco Social History   Tobacco Use  Smoking Status Never Smoker  Smokeless Tobacco Never Used     Counseling given: Not Answered   Clinical Intake:  Pre-visit preparation completed: Yes  Pain : No/denies pain     Nutritional Risks: None Diabetes: No  How often do you need to have someone help you when you read instructions, pamphlets, or other written materials from your doctor or pharmacy?: 1 - Never What is the last grade level you completed in school?: bachelors degree  Interpreter Needed?: No  Information entered by ::  ,LPN  Past Medical History:  Diagnosis Date  . Arthritis   . Hyperlipidemia   . Osteoporosis    Past Surgical History:  Procedure Laterality Date  . ABDOMINAL HYSTERECTOMY    . CESAREAN SECTION    . ILEOANAL RESERVOIR EXCISION W/ ILEOSTOMY     Family History  Problem Relation Age of Onset  . Cancer Mother   . Stroke Father   . Dementia Sister   . Breast cancer Cousin 759  Social History   Socioeconomic History  . Marital status: Divorced    Spouse name: Not on file  . Number of children: Not on file  . Years of education: Not on file  .  Highest education level: Bachelor's degree (e.g., BA, AB, BS)  Occupational History  . Not on file  Social Needs  . Financial resource strain: Not hard at all  . Food insecurity    Worry: Never true    Inability: Never true  . Transportation needs    Medical: No    Non-medical: No  Tobacco Use  . Smoking status: Never Smoker  . Smokeless tobacco: Never Used  Substance and Sexual Activity  . Alcohol use: Yes    Comment: glass of wine occasionally   . Drug use: No  . Sexual activity: Not on file  Lifestyle  . Physical activity    Days per week: 0 days    Minutes per session: 0 min  . Stress: Not at all  Relationships  . Social connections    Talks on phone: More than three times a week    Gets together: More than three times a week    Attends religious service: More than 4 times per year    Active member of club or organization: Yes    Attends meetings of clubs or organizations: More than 4 times per year    Relationship status: Divorced  Other Topics Concern  . Not on file  Social History Narrative  . Not on file    Outpatient Encounter Medications as of 02/01/2019  Medication Sig  . aspirin EC 81 MG tablet Take 81 mg by mouth daily.  . Calcium Citrate-Vitamin D (CALCIUM + D PO) Take by mouth daily.  . Multiple Vitamin (MULTIVITAMIN) tablet Take 1 tablet by mouth daily.  . naproxen sodium (ALEVE) 220 MG tablet Take 220 mg by mouth.  . [DISCONTINUED] ciprofloxacin (CIPRO) 500 MG tablet Take 1 tablet (500 mg total) by mouth 2 (two) times daily. (Patient not taking: Reported on 02/01/2019)   No facility-administered encounter medications on file as of 02/01/2019.     Activities of Daily Living In your present state of health, do you have any difficulty performing the following activities: 02/01/2019  Hearing? N  Vision? N  Difficulty concentrating or making decisions? N  Walking or climbing stairs? Y  Comment knee pain  Dressing or bathing? N  Doing errands, shopping? N   Preparing Food and eating ? N  Using the Toilet? N  In the past six months, have you accidently leaked urine? N  Do you have problems with loss of bowel control? N  Managing your Medications? N  Managing your Finances? N  Housekeeping or managing your Housekeeping? N  Some recent data might be hidden    Patient Care Team: Guadalupe Maple, MD as PCP - General (Family Medicine)    Assessment:   This is a routine wellness examination for Kylieann.  Exercise Activities and Dietary recommendations Current Exercise Habits: The patient does not participate in regular exercise at present, Exercise limited by: None identified  Goals    . DIET - INCREASE WATER INTAKE     Recommend drinking at least 6-8 glasses of water a day        Fall Risk: Fall Risk  02/01/2019 10/20/2017 10/20/2016 10/15/2015  Falls in the past year? 0 No No No    FALL RISK PREVENTION PERTAINING TO THE HOME:  Any stairs in or around the home? yes If so, are there any without handrails? No   Home free of loose throw rugs in walkways, pet beds, electrical cords, etc? No  Adequate lighting in your home to reduce risk of falls? No   ASSISTIVE DEVICES UTILIZED TO PREVENT FALLS:  Life alert? No  Use of a cane, walker or w/c? No  Grab bars in the bathroom? No  Shower chair or bench in shower? No  Elevated toilet seat or a handicapped toilet? No   DME ORDERS:  DME order needed?  No   TIMED UP AND GO:  Was the test performed? Yes .  Length of time to ambulate 10 feet: 12 sec.   GAIT:  Appearance of gait: Gait steady and fast without the use of an assistive device. Education: Fall risk prevention has been discussed.  Intervention(s) required? Yes   DME/home health order needed?  Yes    Depression Screen PHQ 2/9 Scores 02/01/2019 10/20/2017 10/20/2016 10/15/2015  PHQ - 2 Score 0 0 0 0     Cognitive Function     6CIT Screen 02/01/2019 10/20/2017  What Year? 0 points 0 points  What month? 0 points 0 points   What time? 0 points 0 points  Count back from 20 0 points 0 points  Months in reverse 0 points 0 points  Repeat phrase 0 points 0 points  Total Score 0 0    Immunization History  Administered Date(s) Administered  . Pneumococcal Conjugate-13 05/29/2009  . Pneumococcal Polysaccharide-23 06/02/2010  . Pneumococcal-Unspecified 05/29/2009, 06/02/2010  . Td 05/29/2008  . Zoster 06/03/2010  Qualifies for Shingles Vaccine? Yes  Zostavax completed 06/2010. Due for Shingrix. Education has been provided regarding the importance of this vaccine. Pt has been advised to call insurance company to determine out of pocket expense. Advised may also receive vaccine at local pharmacy or Health Dept. Verbalized acceptance and understanding.  Tdap: Discussed need for TD/TDAP vaccine, patient verbalized understanding that this is not covered as a preventative with there insurance and to call the office if she/ develops any new skin injuries, ie: cuts, scrapes, bug bites, or open wounds.  Flu Vaccine: up to date   Pneumococcal Vaccine: up to date   Screening Tests Health Maintenance  Topic Date Due  . PNA vac Low Risk Adult (2 of 2 - PCV13) 03/04/2019 (Originally 06/03/2011)  . TETANUS/TDAP  02/01/2020 (Originally 05/29/2018)  . INFLUENZA VACCINE  03/04/2019  . DEXA SCAN  Completed    Cancer Screenings:  Colorectal Screening: no longer required  Mammogram: no longer required  Bone Density: completed 10/03/2013  Lung Cancer Screening: (Low Dose CT Chest recommended if Age 56-80 years, 30 pack-year currently smoking OR have quit w/in 15years.) does not qualify.   Additional Screening:  Hepatitis C Screening: does not qualify  Vision Screening: Recommended annual ophthalmology exams for early detection of glaucoma and other disorders of the eye. Is the patient up to date with their annual eye exam?  Yes  Who is the provider or what is the name of the office in which the pt attends annual eye  exams? Dr.bell    Dental Screening: Recommended annual dental exams for proper oral hygiene  Community Resource Referral:  CRR required this visit?  No       Plan:  I have personally reviewed and addressed the Medicare Annual Wellness questionnaire and have noted the following in the patient's chart:  A. Medical and social history B. Use of alcohol, tobacco or illicit drugs  C. Current medications and supplements D. Functional ability and status E.  Nutritional status F.  Physical activity G. Advance directives H. List of other physicians I.  Hospitalizations, surgeries, and ER visits in previous 12 months J.  [place] such as hearing and vision if needed, cognitive and depression L. Referrals and appointments   In addition, I have reviewed and discussed with patient certain preventive protocols, quality metrics, and best practice recommendations. A written personalized care plan for preventive services as well as general preventive health recommendations were provided to patient. Nurse Health Advisor  Signed,    [place], Caro Hight, [place]  08/11/3435 Nurse Health Advisor   Nurse Notes: none

## 2019-02-01 NOTE — Progress Notes (Signed)
BP 116/74   Pulse 86   Temp 98.1 F (36.7 C) (Temporal)   Ht 4' 11"  (1.499 m)   Wt 174 lb 14.4 oz (79.3 kg)   BMI 35.33 kg/m    Subjective:    Patient ID: Victoria Peterson, female    DOB: 1940-11-09, 78 y.o.   MRN: [id number]  HPI: Victoria Peterson is a 78 y.o. female presenting on 02/01/2019 for comprehensive medical examination. Current medical complaints include:none  She currently lives with: Menopausal Symptoms: no  Depression Screen done today and results listed below:  Depression screen [facility] 2/9 02/01/2019 10/20/2017 10/20/2016 10/15/2015  Decreased Interest 0 0 0 0  Down, Depressed, Hopeless 0 0 0 0  PHQ - 2 Score 0 0 0 0    The patient does not have a history of falls. I did not complete a risk assessment for falls. A plan of care for falls was not documented.   Past Medical History:  Past Medical History:  Diagnosis Date  . Arthritis   . Hyperlipidemia   . Osteoporosis     Surgical History:  Past Surgical History:  Procedure Laterality Date  . ABDOMINAL HYSTERECTOMY    . CESAREAN SECTION    . ILEOANAL RESERVOIR EXCISION W/ ILEOSTOMY      Medications:  Current Outpatient Medications on File Prior to Visit  Medication Sig  . aspirin EC 81 MG tablet Take 81 mg by mouth daily.  . Calcium Citrate-Vitamin D (CALCIUM + D PO) Take by mouth daily.  . Multiple Vitamin (MULTIVITAMIN) tablet Take 1 tablet by mouth daily.  . naproxen sodium (ALEVE) 220 MG tablet Take 220 mg by mouth.   No current facility-administered medications on file prior to visit.     Allergies:  No Known Allergies  Social History:  Social History   Socioeconomic History  . Marital status: Divorced    Spouse name: Not on file  . Number of children: Not on file  . Years of education: Not on file  . Highest education level: Bachelor's degree (e.g., BA, AB, BS)  Occupational History  . Not on file  Social Needs  . Financial resource strain: Not hard at all  . Food insecurity    Worry:  Never true    Inability: Never true  . Transportation needs    Medical: No    Non-medical: No  Tobacco Use  . Smoking status: Never Smoker  . Smokeless tobacco: Never Used  Substance and Sexual Activity  . Alcohol use: Yes    Comment: glass of wine occasionally   . Drug use: No  . Sexual activity: Not on file  Lifestyle  . Physical activity    Days per week: 0 days    Minutes per session: 0 min  . Stress: Not at all  Relationships  . Social connections    Talks on phone: More than three times a week    Gets together: More than three times a week    Attends religious service: More than 4 times per year    Active member of club or organization: Yes    Attends meetings of clubs or organizations: More than 4 times per year    Relationship status: Divorced  . Intimate partner violence    Fear of current or ex partner: No    Emotionally abused: No    Physically abused: No    Forced sexual activity: No  Other Topics Concern  . Not on file  Social History Narrative  .  Not on file   Social History   Tobacco Use  Smoking Status Never Smoker  Smokeless Tobacco Never Used   Social History   Substance and Sexual Activity  Alcohol Use Yes   Comment: glass of wine occasionally     Family History:  Family History  Problem Relation Age of Onset  . Cancer Mother   . Stroke Father   . Dementia Sister   . Breast cancer Cousin 36    Past medical history, surgical history, medications, allergies, family history and social history reviewed with patient today and changes made to appropriate areas of the chart.   Review of Systems - negative All other ROS negative except what is listed above and in the HPI.      Objective:    BP 116/74   Pulse 86   Temp 98.1 F (36.7 C) (Temporal)   Ht 4' 11"  (1.499 m)   Wt 174 lb 14.4 oz (79.3 kg)   BMI 35.33 kg/m   Wt Readings from Last 3 Encounters:  02/01/19 174 lb 14.4 oz (79.3 kg)  02/01/19 174 lb 14.4 oz (79.3 kg)  03/03/18  175 lb 4 oz (79.5 kg)    Physical Exam Vitals signs and nursing note reviewed.  Constitutional:      General: She is awake. She is not in acute distress.    Appearance: She is well-developed. She is not ill-appearing.  HENT:     Head: Normocephalic.     Right Ear: Hearing, tympanic membrane, ear canal and external ear normal.     Left Ear: Hearing, tympanic membrane, ear canal and external ear normal.     Nose: Nose normal.     Mouth/Throat:     Mouth: Mucous membranes are moist.     Pharynx: Oropharynx is clear. Uvula midline.  Eyes:     General: Lids are normal.        Right eye: No discharge.        Left eye: No discharge.     Extraocular Movements: Extraocular movements intact.     Conjunctiva/sclera: Conjunctivae normal.     Pupils: Pupils are equal, round, and reactive to light.     Visual Fields: Right eye visual fields normal and left eye visual fields normal.  Neck:     Musculoskeletal: Normal range of motion and neck supple.     Thyroid: No thyromegaly.     Vascular: No carotid bruit or JVD.  Cardiovascular:     Rate and Rhythm: Normal rate and regular rhythm.     Heart sounds: Normal heart sounds. No murmur. No gallop.   Pulmonary:     Effort: Pulmonary effort is normal. No accessory muscle usage or respiratory distress.     Breath sounds: Normal breath sounds.  Abdominal:     General: Bowel sounds are normal.     Palpations: Abdomen is soft. There is no hepatomegaly or splenomegaly.     Comments: Areas of old scarring to abdomen.  Musculoskeletal:     Right lower leg: No edema.     Left lower leg: No edema.  Lymphadenopathy:     Cervical: No cervical adenopathy.  Skin:    General: Skin is warm and dry.  Neurological:     Mental Status: She is alert and oriented to person, place, and time.     Cranial Nerves: Cranial nerves are intact.     Coordination: Coordination is intact.     Gait: Gait is intact.  Deep Tendon Reflexes: Reflexes are normal and  symmetric.  Psychiatric:        Attention and Perception: Attention normal.        Mood and Affect: Mood normal.        Speech: Speech normal.        Behavior: Behavior normal. Behavior is cooperative.        Thought Content: Thought content normal.        Cognition and Memory: Cognition normal.        Judgment: Judgment normal.     Results for orders placed or performed in visit on 03/03/18  B12  Result Value Ref Range   Vitamin B-12 >2000 (H) 232 - 1245 pg/mL  HgB A1c  Result Value Ref Range   Hgb A1c MFr Bld 5.7 (H) 4.8 - 5.6 %   Est. average glucose Bld gHb Est-mCnc 117 mg/dL  CBC with Differential  Result Value Ref Range   WBC 5.2 3.4 - 10.8 x10E3/uL   RBC 4.95 3.77 - 5.28 x10E6/uL   Hemoglobin 13.8 11.1 - 15.9 g/dL   Hematocrit 43.0 34.0 - 46.6 %   MCV 87 79 - 97 fL   MCH 27.9 26.6 - 33.0 pg   MCHC 32.1 31.5 - 35.7 g/dL   RDW 14.2 12.3 - 15.4 %   Platelets 248 150 - 450 x10E3/uL   Neutrophils 52 Not Estab. %   Lymphs 37 Not Estab. %   Monocytes 9 Not Estab. %   Eos 1 Not Estab. %   Basos 1 Not Estab. %   Neutrophils Absolute 2.7 1.4 - 7.0 x10E3/uL   Lymphocytes Absolute 1.9 0.7 - 3.1 x10E3/uL   Monocytes Absolute 0.5 0.1 - 0.9 x10E3/uL   EOS (ABSOLUTE) 0.1 0.0 - 0.4 x10E3/uL   Basophils Absolute 0.0 0.0 - 0.2 x10E3/uL   Immature Granulocytes 0 Not Estab. %   Immature Grans (Abs) 0.0 0.0 - 0.1 x10E3/uL      Assessment & Plan:   Problem List Items Addressed This Visit      Digestive   Severe chronic ulcerative colitis (HCC)    Stable, no current medications.  CBC and CMP today.      Relevant Orders   CBC with Differential/Platelet   Comprehensive metabolic panel     Other   Prediabetes    Check A1C and focus on diet.      Relevant Orders   HgB A1c    Other Visit Diagnoses    Encounter for annual physical exam    -  Primary   Relevant Orders   TSH   Lipid Panel w/o Chol/HDL Ratio   Vitamin D deficiency       Check Vit D level and continue  supplement, last bone density showed osteopenia, does not want repeat.   Relevant Orders   VITAMIN D 25 Hydroxy (Vit-D Deficiency, Fractures)       Follow up plan: Return in about 1 year (around 02/01/2020) for Annual exam.   LABORATORY TESTING:  - Pap smear: not applicable  IMMUNIZATIONS:   - Tdap: Tetanus vaccination status reviewed: last tetanus booster within 10 years. - Influenza: Up to date - Pneumovax: Up to date - Prevnar: Up to date - HPV: Not applicable - Zostavax vaccine: Up to date  SCREENING: -Mammogram: Up to date  - Colonoscopy: Not applicable  - Bone Density: Refused  -Hearing Test: Refused  -Spirometry: Refused   PATIENT COUNSELING:   Advised to take 1 mg of folate supplement  per day if capable of pregnancy.   Sexuality: Discussed sexually transmitted diseases, partner selection, use of condoms, avoidance of unintended pregnancy  and contraceptive alternatives.   Advised to avoid cigarette smoking.  I discussed with the patient that most people either abstain from alcohol or drink within safe limits (<=14/week and <=4 drinks/occasion for males, <=7/weeks and <= 3 drinks/occasion for females) and that the risk for alcohol disorders and other health effects rises proportionally with the number of drinks per week and how often a drinker exceeds daily limits.  Discussed cessation/primary prevention of drug use and availability of treatment for abuse.   Diet: Encouraged to adjust caloric intake to maintain  or achieve ideal body weight, to reduce intake of dietary saturated fat and total fat, to limit sodium intake by avoiding high sodium foods and not adding table salt, and to maintain adequate dietary potassium and calcium preferably from fresh fruits, vegetables, and low-fat dairy products.    stressed the importance of regular exercise  Injury prevention: Discussed safety belts, safety helmets, smoke detector, smoking near bedding or upholstery.   Dental  health: Discussed importance of regular tooth brushing, flossing, and dental visits.    NEXT PREVENTATIVE PHYSICAL DUE IN 1 YEAR. Return in about 1 year (around 02/01/2020) for Annual exam.

## 2019-02-02 LAB — TSH: TSH: 0.548 u[IU]/mL (ref 0.450–4.500)

## 2019-02-02 LAB — COMPREHENSIVE METABOLIC PANEL
ALT: 15 IU/L (ref 0–32)
AST: 23 IU/L (ref 0–40)
Albumin/Globulin Ratio: 1.7 (ref 1.2–2.2)
Albumin: 4.6 g/dL (ref 3.7–4.7)
Alkaline Phosphatase: 49 IU/L (ref 39–117)
BUN/Creatinine Ratio: 18 (ref 12–28)
BUN: 17 mg/dL (ref 8–27)
Bilirubin Total: 1 mg/dL (ref 0.0–1.2)
CO2: 21 mmol/L (ref 20–29)
Calcium: 9.6 mg/dL (ref 8.7–10.3)
Chloride: 105 mmol/L (ref 96–106)
Creatinine, Ser: 0.94 mg/dL (ref 0.57–1.00)
GFR calc Af Amer: 68 mL/min/{1.73_m2} (ref >59)
GFR calc non Af Amer: 59 mL/min/{1.73_m2} — ABNORMAL LOW (ref >59)
Globulin, Total: 2.7 g/dL (ref 1.5–4.5)
Glucose: 83 mg/dL (ref 65–99)
Potassium: 4.1 mmol/L (ref 3.5–5.2)
Sodium: 142 mmol/L (ref 134–144)
Total Protein: 7.3 g/dL (ref 6.0–8.5)

## 2019-02-02 LAB — CBC WITH DIFFERENTIAL/PLATELET
Basophils Absolute: 0 10*3/uL (ref 0.0–0.2)
Basos: 1 %
EOS (ABSOLUTE): 0.1 10*3/uL (ref 0.0–0.4)
Eos: 1 %
Hematocrit: 42.3 % (ref 34.0–46.6)
Hemoglobin: 14.6 g/dL (ref 11.1–15.9)
Immature Grans (Abs): 0 10*3/uL (ref 0.0–0.1)
Immature Granulocytes: 0 %
Lymphocytes Absolute: 1.7 10*3/uL (ref 0.7–3.1)
Lymphs: 33 %
MCH: 29.9 pg (ref 26.6–33.0)
MCHC: 34.5 g/dL (ref 31.5–35.7)
MCV: 87 fL (ref 79–97)
Monocytes Absolute: 0.3 10*3/uL (ref 0.1–0.9)
Monocytes: 7 %
Neutrophils Absolute: 3 10*3/uL (ref 1.4–7.0)
Neutrophils: 58 %
Platelets: 241 10*3/uL (ref 150–450)
RBC: 4.89 x10E6/uL (ref 3.77–5.28)
RDW: 13.1 % (ref 11.7–15.4)
WBC: 5.2 10*3/uL (ref 3.4–10.8)

## 2019-02-02 LAB — LIPID PANEL W/O CHOL/HDL RATIO
Cholesterol, Total: 237 mg/dL — ABNORMAL HIGH (ref 100–199)
HDL: 78 mg/dL (ref >39)
LDL Calculated: 140 mg/dL — ABNORMAL HIGH (ref 0–99)
Triglycerides: 94 mg/dL (ref 0–149)
VLDL Cholesterol Cal: 19 mg/dL (ref 5–40)

## 2019-02-02 LAB — VITAMIN D 25 HYDROXY (VIT D DEFICIENCY, FRACTURES): Vit D, 25-Hydroxy: 38.3 ng/mL (ref 30.0–100.0)

## 2019-02-02 LAB — HEMOGLOBIN A1C
Est. average glucose Bld gHb Est-mCnc: 114 mg/dL
Hgb A1c MFr Bld: 5.6 % (ref 4.8–5.6)

## 2019-02-21 ENCOUNTER — Encounter: Payer: Self-pay | Admitting: Family Medicine

## 2019-03-21 DIAGNOSIS — H40003 Preglaucoma, unspecified, bilateral: Secondary | ICD-10-CM | POA: Diagnosis not present

## 2019-04-12 ENCOUNTER — Other Ambulatory Visit: Payer: Self-pay

## 2019-04-12 ENCOUNTER — Ambulatory Visit (INDEPENDENT_AMBULATORY_CARE_PROVIDER_SITE_OTHER): Payer: Medicare Other | Admitting: Nurse Practitioner

## 2019-04-12 ENCOUNTER — Encounter: Payer: Self-pay | Admitting: *Deleted

## 2019-04-12 ENCOUNTER — Encounter: Payer: Self-pay | Admitting: Nurse Practitioner

## 2019-04-12 VITALS — BP 129/85 | HR 92 | Temp 98.4°F | Ht 59.0 in | Wt 174.0 lb

## 2019-04-12 DIAGNOSIS — M545 Low back pain, unspecified: Secondary | ICD-10-CM | POA: Insufficient documentation

## 2019-04-12 DIAGNOSIS — Z8719 Personal history of other diseases of the digestive system: Secondary | ICD-10-CM | POA: Diagnosis not present

## 2019-04-12 MED ORDER — TIZANIDINE HCL 4 MG PO TABS: 4.0000 mg | ORAL_TABLET | Freq: Four times a day (QID) | ORAL | 0 refills | Status: DC | PRN
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
## 2019-04-12 NOTE — Patient Instructions (Signed)
Imaging: [address] [place], [place] [id number] [phone] Acute Back Pain, Adult Acute back pain is sudden and usually short-lived. It is often caused by an injury to the muscles and tissues in the back. The injury may result from:  A muscle or ligament getting overstretched or torn (strained). Ligaments are tissues that connect bones to each other. Lifting something improperly can cause a back strain.  Wear and tear (degeneration) of the spinal disks. Spinal disks are circular tissue that provides cushioning between the bones of the spine (vertebrae).  Twisting motions, such as while playing sports or doing yard work.  A hit to the back.  Arthritis. You may have a physical exam, lab tests, and imaging tests to find the cause of your pain. Acute back pain usually goes away with rest and home care. Follow these instructions at home: Managing pain, stiffness, and swelling  Take over-the-counter and prescription medicines only as told by your health care provider.  Your health care provider may recommend applying ice during the first 24-48 hours after your pain starts. To do this: ? Put ice in a plastic bag. ? Place a towel between your skin and the bag. ? Leave the ice on for 20 minutes, 2-3 times a day.  If directed, apply heat to the affected area as often as told by your health care provider. Use the heat source that your health care provider recommends, such as a moist heat pack or a heating pad. ? Place a towel between your skin and the heat source. ? Leave the heat on for 20-30 minutes. ? Remove the heat if your skin turns bright red. This is especially important if you are unable to feel pain, heat, or cold. You have a greater risk of getting burned. Activity   Do not stay in bed. Staying in bed for more than 1-2 days can delay your recovery.  Sit up and stand up straight. Avoid leaning forward when you sit, or hunching over when you stand. ? If you work at a  desk, sit close to it so you do not need to lean over. Keep your chin tucked in. Keep your neck drawn back, and keep your elbows bent at a right angle. Your arms should look like the letter "L." ? Sit high and close to the steering wheel when you drive. Add lower back (lumbar) support to your car seat, if needed.  Take short walks on even surfaces as soon as you are able. Try to increase the length of time you walk each day.  Do not sit, drive, or stand in one place for more than 30 minutes at a time. Sitting or standing for long periods of time can put stress on your back.  Do not drive or use heavy machinery while taking prescription pain medicine.  Use proper lifting techniques. When you bend and lift, use positions that put less stress on your back: ? [place] your knees. ? Keep the load close to your body. ? Avoid twisting.  Exercise regularly as told by your health care provider. Exercising helps your back heal faster and helps prevent back injuries by keeping muscles strong and flexible.  Work with a physical therapist to make a safe exercise program, as recommended by your health care provider. Do any exercises as told by your physical therapist. Lifestyle  Maintain a healthy weight. Extra weight puts stress on your back and makes it difficult to have good posture.  Avoid activities or situations that  make you feel anxious or stressed. Stress and anxiety increase muscle tension and can make back pain worse. Learn ways to manage anxiety and stress, such as through exercise. General instructions  Sleep on a firm mattress in a comfortable position. Try lying on your side with your knees slightly bent. If you lie on your back, put a pillow under your knees.  Follow your treatment plan as told by your health care provider. This may include: ? Cognitive or behavioral therapy. ? Acupuncture or massage therapy. ? Meditation or yoga. Contact a health care provider if:  You have pain that  is not relieved with rest or medicine.  You have increasing pain going down into your legs or buttocks.  Your pain does not improve after 2 weeks.  You have pain at night.  You lose weight without trying.  You have a fever or chills. Get help right away if:  You develop new bowel or bladder control problems.  You have unusual weakness or numbness in your arms or legs.  You develop nausea or vomiting.  You develop abdominal pain.  You feel faint. Summary  Acute back pain is sudden and usually short-lived.  Use proper lifting techniques. When you bend and lift, use positions that put less stress on your back.  Take over-the-counter and prescription medicines and apply heat or ice as directed by your health care provider. This information is not intended to replace advice given to you by your health care provider. Make sure you discuss any questions you have with your health care provider. Document Released: 07/20/2005 Document Revised: 11/08/2018 Document Reviewed: 03/03/2017 Elsevier Patient Education  2020 [employer].
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
## 2019-04-13 ENCOUNTER — Ambulatory Visit: Payer: Medicare Other | Admitting: Gastroenterology

## 2019-04-14 ENCOUNTER — Ambulatory Visit
Admission: RE | Admit: 2019-04-14 | Discharge: 2019-04-14 | Disposition: A | Discharge status: Home / Self Care | Payer: Medicare Other | Source: Ambulatory Visit | Attending: Nurse Practitioner | Admitting: Nurse Practitioner

## 2019-04-14 ENCOUNTER — Other Ambulatory Visit: Payer: Self-pay

## 2019-04-14 DIAGNOSIS — M545 Low back pain, unspecified: Secondary | ICD-10-CM

## 2019-04-14 LAB — ANEMIA PANEL
Ferritin: 28 ng/mL (ref 15–150)
Folate, Hemolysate: 352 ng/mL
Folate, RBC: 850 ng/mL (ref >498)
Hematocrit: 41.4 % (ref 34.0–46.6)
Iron Saturation: 30 % (ref 15–55)
Iron: 117 ug/dL (ref 27–139)
Retic Ct Pct: 1.4 % (ref 0.6–2.6)
Total Iron Binding Capacity: 388 ug/dL (ref 250–450)
UIBC: 271 ug/dL (ref 118–369)
Vitamin B-12: 732 pg/mL (ref 232–1245)

## 2019-04-14 LAB — COMPREHENSIVE METABOLIC PANEL
ALT: 14 IU/L (ref 0–32)
AST: 21 IU/L (ref 0–40)
Albumin/Globulin Ratio: 2.1 (ref 1.2–2.2)
Albumin: 4.4 g/dL (ref 3.7–4.7)
Alkaline Phosphatase: 58 IU/L (ref 39–117)
BUN/Creatinine Ratio: 19 (ref 12–28)
BUN: 16 mg/dL (ref 8–27)
Bilirubin Total: 0.7 mg/dL (ref 0.0–1.2)
CO2: 22 mmol/L (ref 20–29)
Calcium: 9.2 mg/dL (ref 8.7–10.3)
Chloride: 107 mmol/L — ABNORMAL HIGH (ref 96–106)
Creatinine, Ser: 0.83 mg/dL (ref 0.57–1.00)
GFR calc Af Amer: 79 mL/min/{1.73_m2} (ref >59)
GFR calc non Af Amer: 69 mL/min/{1.73_m2} (ref >59)
Globulin, Total: 2.1 g/dL (ref 1.5–4.5)
Glucose: 111 mg/dL — ABNORMAL HIGH (ref 65–99)
Potassium: 4.4 mmol/L (ref 3.5–5.2)
Sodium: 143 mmol/L (ref 134–144)
Total Protein: 6.5 g/dL (ref 6.0–8.5)

## 2019-04-14 LAB — CBC WITH DIFFERENTIAL/PLATELET
Basophils Absolute: 0 10*3/uL (ref 0.0–0.2)
Basos: 1 %
EOS (ABSOLUTE): 0 10*3/uL (ref 0.0–0.4)
Eos: 1 %
Hemoglobin: 14.2 g/dL (ref 11.1–15.9)
Immature Grans (Abs): 0 10*3/uL (ref 0.0–0.1)
Immature Granulocytes: 0 %
Lymphocytes Absolute: 1.5 10*3/uL (ref 0.7–3.1)
Lymphs: 33 %
MCH: 29.6 pg (ref 26.6–33.0)
MCHC: 34.3 g/dL (ref 31.5–35.7)
MCV: 86 fL (ref 79–97)
Monocytes Absolute: 0.3 10*3/uL (ref 0.1–0.9)
Monocytes: 7 %
Neutrophils Absolute: 2.7 10*3/uL (ref 1.4–7.0)
Neutrophils: 58 %
Platelets: 236 10*3/uL (ref 150–450)
RBC: 4.79 x10E6/uL (ref 3.77–5.28)
RDW: 12.6 % (ref 11.7–15.4)
WBC: 4.6 10*3/uL (ref 3.4–10.8)

## 2019-05-16 ENCOUNTER — Encounter (INDEPENDENT_AMBULATORY_CARE_PROVIDER_SITE_OTHER): Payer: Self-pay

## 2019-05-16 ENCOUNTER — Other Ambulatory Visit: Payer: Self-pay

## 2019-05-16 ENCOUNTER — Ambulatory Visit: Payer: Medicare Other | Admitting: Gastroenterology

## 2019-05-16 ENCOUNTER — Encounter: Payer: Self-pay | Admitting: Gastroenterology

## 2019-05-16 VITALS — BP 122/80 | HR 99 | Temp 98.1°F | Resp 17 | Ht 59.0 in | Wt 170.6 lb

## 2019-05-16 DIAGNOSIS — Z932 Ileostomy status: Secondary | ICD-10-CM

## 2019-05-16 DIAGNOSIS — Z9049 Acquired absence of other specified parts of digestive tract: Secondary | ICD-10-CM | POA: Insufficient documentation
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
## 2019-05-18 DIAGNOSIS — E531 Pyridoxine deficiency: Secondary | ICD-10-CM | POA: Diagnosis not present

## 2019-05-18 DIAGNOSIS — G5603 Carpal tunnel syndrome, bilateral upper limbs: Secondary | ICD-10-CM | POA: Diagnosis not present

## 2019-05-18 DIAGNOSIS — R200 Anesthesia of skin: Secondary | ICD-10-CM | POA: Diagnosis not present

## 2019-05-18 DIAGNOSIS — R202 Paresthesia of skin: Secondary | ICD-10-CM | POA: Diagnosis not present

## 2019-05-25 ENCOUNTER — Telehealth: Payer: Self-pay | Admitting: Gastroenterology

## 2019-05-25 NOTE — Telephone Encounter (Signed)
Patient called & l/m on v/m stating she has procedure on 06-02-19 & has questions .please call & advise.

## 2019-05-29 NOTE — Telephone Encounter (Signed)
Pt would like to speak directly with you concerning procedure, pt states her says she does not think pt should have this procedure and wants to talk with you, pls call pt

## 2019-05-29 NOTE — Telephone Encounter (Signed)
Pt is calling  For Dr. Marius Ditch she has questions regarding her procedure and wants to discuss with Dr. Marius Ditch directly please call  [phone] until 3pm

## 2019-05-30 ENCOUNTER — Other Ambulatory Visit: Admission: RE | Admit: 2019-05-30 | Payer: Medicare Other | Source: Ambulatory Visit

## 2019-05-30 NOTE — Telephone Encounter (Signed)
Discussed with patient, she would like to postpone the procedure at this time.  She does not have any active GI concerns I also offered her if she would like to see colorectal surgery, she deferred at this time She will reach out to me if needed  Caryl Pina, go ahead and cancel her flexible sigmoidoscopy  Thanks Rohini Vanga

## 2019-05-30 NOTE — Telephone Encounter (Signed)
Canceled procedure with Margarita Grizzle at [facility]

## 2019-06-02 ENCOUNTER — Ambulatory Visit: Admit: 2019-06-02 | Payer: Medicare Other | Admitting: Gastroenterology

## 2019-06-02 SURGERY — SIGMOIDOSCOPY, FLEXIBLE
Anesthesia: General

## 2019-06-27 DIAGNOSIS — G5603 Carpal tunnel syndrome, bilateral upper limbs: Secondary | ICD-10-CM | POA: Insufficient documentation

## 2019-06-27 DIAGNOSIS — R768 Other specified abnormal immunological findings in serum: Secondary | ICD-10-CM | POA: Insufficient documentation

## 2019-07-06 ENCOUNTER — Encounter (INDEPENDENT_AMBULATORY_CARE_PROVIDER_SITE_OTHER): Payer: Self-pay

## 2019-07-06 ENCOUNTER — Other Ambulatory Visit: Payer: Self-pay

## 2019-07-06 ENCOUNTER — Ambulatory Visit: Payer: Medicare Other | Admitting: Gastroenterology

## 2019-07-06 ENCOUNTER — Encounter: Payer: Self-pay | Admitting: Gastroenterology

## 2019-07-06 VITALS — BP 109/62 | HR 64 | Temp 98.2°F | Wt 175.4 lb

## 2019-07-06 DIAGNOSIS — Z932 Ileostomy status: Secondary | ICD-10-CM | POA: Diagnosis not present

## 2019-07-06 DIAGNOSIS — Z9049 Acquired absence of other specified parts of digestive tract: Secondary | ICD-10-CM | POA: Diagnosis not present
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
## 2019-08-28 DIAGNOSIS — M1712 Unilateral primary osteoarthritis, left knee: Secondary | ICD-10-CM | POA: Diagnosis not present

## 2019-08-28 DIAGNOSIS — M1711 Unilateral primary osteoarthritis, right knee: Secondary | ICD-10-CM | POA: Diagnosis not present

## 2019-09-11 DIAGNOSIS — G2581 Restless legs syndrome: Secondary | ICD-10-CM | POA: Diagnosis not present

## 2019-09-11 DIAGNOSIS — G5603 Carpal tunnel syndrome, bilateral upper limbs: Secondary | ICD-10-CM | POA: Diagnosis not present

## 2019-09-11 DIAGNOSIS — E611 Iron deficiency: Secondary | ICD-10-CM | POA: Diagnosis not present

## 2019-09-14 ENCOUNTER — Telehealth: Payer: Self-pay | Admitting: Family Medicine

## 2019-09-14 NOTE — Telephone Encounter (Signed)
Copied from [place] [phone]. Topic: General - Call Back - No Documentation >> Sep 14, 2019 12:06 PM Erick Blinks wrote: Reason for CRM: Pt is requesting to transition to [facility].  Best contact: [phone]

## 2019-09-14 NOTE — Telephone Encounter (Signed)
I've met this lady 1x in 2016. We don't usually allow switching without a reason- can we look into this? 

## 2019-09-15 ENCOUNTER — Ambulatory Visit (INDEPENDENT_AMBULATORY_CARE_PROVIDER_SITE_OTHER): Payer: Medicare Other | Admitting: Family Medicine

## 2019-09-15 ENCOUNTER — Encounter: Payer: Self-pay | Admitting: Family Medicine

## 2019-09-15 ENCOUNTER — Other Ambulatory Visit: Payer: Self-pay

## 2019-09-15 DIAGNOSIS — M170 Bilateral primary osteoarthritis of knee: Secondary | ICD-10-CM | POA: Diagnosis not present

## 2019-09-15 DIAGNOSIS — G5603 Carpal tunnel syndrome, bilateral upper limbs: Secondary | ICD-10-CM | POA: Diagnosis not present

## 2019-09-15 MED ORDER — DICLOFENAC SODIUM 1 % EX GEL: 4.0000 g | Freq: Four times a day (QID) | CUTANEOUS | 3 refills | Status: DC
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
## 2019-09-15 NOTE — Telephone Encounter (Signed)
Patient states she was informed by her insurance company that [facility] cannot be her PCP and was offered to select a doctor at another office.  Patient prefers to stay with our office to see you because she saw you previously and really appreciated the way you listened to her during her visit.

## 2019-09-18 DIAGNOSIS — G5603 Carpal tunnel syndrome, bilateral upper limbs: Secondary | ICD-10-CM | POA: Diagnosis not present

## 2019-09-18 NOTE — Telephone Encounter (Signed)
Sounds good

## 2019-09-21 ENCOUNTER — Other Ambulatory Visit: Payer: Self-pay | Admitting: Orthopedic Surgery

## 2019-09-26 NOTE — Assessment & Plan Note (Signed)
Not improving. Advised her to follow up with orthopedics. Will do surgical clearance as needed. Continue to monitor.

## 2019-09-28 DIAGNOSIS — M25662 Stiffness of left knee, not elsewhere classified: Secondary | ICD-10-CM | POA: Diagnosis not present

## 2019-09-28 DIAGNOSIS — M25562 Pain in left knee: Secondary | ICD-10-CM | POA: Diagnosis not present

## 2019-09-29 ENCOUNTER — Encounter
Admission: RE | Admit: 2019-09-29 | Discharge: 2019-09-29 | Disposition: A | Discharge status: Home / Self Care | Payer: Medicare Other | Source: Ambulatory Visit | Attending: Orthopedic Surgery | Admitting: Orthopedic Surgery

## 2019-09-29 ENCOUNTER — Other Ambulatory Visit: Payer: Self-pay

## 2019-09-29 ENCOUNTER — Ambulatory Visit: Payer: Medicare Other | Admitting: Nurse Practitioner

## 2019-09-29 DIAGNOSIS — Z01812 Encounter for preprocedural laboratory examination: Secondary | ICD-10-CM | POA: Insufficient documentation

## 2019-09-29 HISTORY — DX: Personal history of urinary calculi: Z87.442

## 2019-09-29 HISTORY — DX: Other intervertebral disc degeneration, lumbar region without mention of lumbar back pain or lower extremity pain: M51.369

## 2019-09-29 HISTORY — DX: Other intervertebral disc degeneration, lumbar region: M51.36

## 2019-09-29 NOTE — Patient Instructions (Signed)
Your procedure is scheduled on: Tues 3/9 Report to Day [facility]. [facility] To find out your arrival time please call [phone] between 1PM - 3PM on Mon 3/8  Remember: Instructions that are not followed completely may result in serious medical risk,  up to and including death, or upon the discretion of your surgeon and anesthesiologist your  surgery may need to be rescheduled.     _X__ 1. Do not eat food after midnight the night before your procedure.                 No gum chewing or hard candies. You may drink clear liquids up to 2 hours                 before you are scheduled to arrive for your surgery- DO not drink clear                 liquids within 2 hours of the start of your surgery.                 Clear Liquids include:  water, apple juice without pulp, clear Gatorade, G2 or                  Gatorade Zero (avoid Red/Purple/Blue), Black Coffee or Tea (Do not add                 anything to coffee or tea). _____2.   Complete the carbohydrate drink provided to you, 2 hours before arrival.  __X__2.  On the morning of surgery brush your teeth with toothpaste and water, you                may rinse your mouth with mouthwash if you wish.  Do not swallow any toothpaste of mouthwash.     _X__ 3.  No Alcohol for 24 hours before or after surgery.   __ 4.  Do Not Smoke or use e-cigarettes For 24 Hours Prior to Your Surgery.                 Do not use any chewable tobacco products for at least 6 hours prior to                 surgery.  ____  5.  Bring all medications with you on the day of surgery if instructed.   __x__  6.  Notify your doctor if there is any change in your medical condition      (cold, fever, infections).     Do not wear jewelry, make-up, hairpins, clips or nail polish. Do not wear lotions, powders, or perfumes. You may wear deodorant. Do not shave 48 hours prior to surgery. Men may shave face and neck. Do not bring valuables to the  hospital.    [facility] [place] is not responsible for any belongings or valuables.  Contacts, dentures or bridgework may not be worn into surgery. Leave your suitcase in the car. After surgery it may be brought to your room. For patients admitted to the hospital, discharge time is determined by your treatment team.   Patients discharged the day of surgery will not be allowed to drive home.   Make arrangements for someone to be with you for the first 24 hours of your Same Day Discharge.    Please read over the following fact sheets that you were given:      ____ Take these medicines the morning of surgery with A SIP OF  WATER:    1. none  2.   3.   4.  5.  6.  ____ Fleet Enema (as directed)   __x__ Use CHG Soap (or wipes) as directed  ____ Use Benzoyl Peroxide Gel as instructed  ____ Use inhalers on the day of surgery  ____ Stop metformin 2 days prior to surgery    ____ Take 1/2 of usual insulin dose the night before surgery. No insulin the morning          of surgery.   _x___ Stop aspirin on 3/2   __x__ Stop Anti-inflammatories naproxen sodium (ALEVE) 220 MG tablet, or ibuprofen on 3/2   May take tylenol   ____ Stop supplements until after surgery.    ____ Bring C-Pap to the hospital.

## 2019-10-02 HISTORY — PX: JOINT REPLACEMENT: SHX530

## 2019-10-04 ENCOUNTER — Ambulatory Visit: Payer: Medicare Other

## 2019-10-04 ENCOUNTER — Other Ambulatory Visit: Payer: Self-pay

## 2019-10-04 ENCOUNTER — Encounter
Admission: RE | Admit: 2019-10-04 | Discharge: 2019-10-04 | Disposition: A | Discharge status: Home / Self Care | Payer: Medicare Other | Source: Ambulatory Visit | Attending: Orthopedic Surgery | Admitting: Orthopedic Surgery

## 2019-10-04 DIAGNOSIS — Z7982 Long term (current) use of aspirin: Secondary | ICD-10-CM | POA: Insufficient documentation

## 2019-10-04 DIAGNOSIS — Z79899 Other long term (current) drug therapy: Secondary | ICD-10-CM | POA: Insufficient documentation

## 2019-10-04 DIAGNOSIS — M1712 Unilateral primary osteoarthritis, left knee: Secondary | ICD-10-CM | POA: Diagnosis not present

## 2019-10-04 DIAGNOSIS — Z01818 Encounter for other preprocedural examination: Secondary | ICD-10-CM | POA: Diagnosis not present

## 2019-10-04 DIAGNOSIS — Z01811 Encounter for preprocedural respiratory examination: Secondary | ICD-10-CM

## 2019-10-04 DIAGNOSIS — E785 Hyperlipidemia, unspecified: Secondary | ICD-10-CM | POA: Diagnosis not present

## 2019-10-04 DIAGNOSIS — Z01810 Encounter for preprocedural cardiovascular examination: Secondary | ICD-10-CM | POA: Diagnosis not present

## 2019-10-04 LAB — CBC WITH DIFFERENTIAL/PLATELET
Abs Immature Granulocytes: 0.01 10*3/uL (ref 0.00–0.07)
Basophils Absolute: 0.1 10*3/uL (ref 0.0–0.1)
Basophils Relative: 1 %
Eosinophils Absolute: 0.1 10*3/uL (ref 0.0–0.5)
Eosinophils Relative: 2 %
HCT: 40.6 % (ref 36.0–46.0)
Hemoglobin: 13.5 g/dL (ref 12.0–15.0)
Immature Granulocytes: 0 %
Lymphocytes Relative: 31 %
Lymphs Abs: 1.4 10*3/uL (ref 0.7–4.0)
MCH: 29.2 pg (ref 26.0–34.0)
MCHC: 33.3 g/dL (ref 30.0–36.0)
MCV: 87.7 fL (ref 80.0–100.0)
Monocytes Absolute: 0.3 10*3/uL (ref 0.1–1.0)
Monocytes Relative: 7 %
Neutro Abs: 2.7 10*3/uL (ref 1.7–7.7)
Neutrophils Relative %: 59 %
Platelets: 249 10*3/uL (ref 150–400)
RBC: 4.63 MIL/uL (ref 3.87–5.11)
RDW: 12.9 % (ref 11.5–15.5)
WBC: 4.6 10*3/uL (ref 4.0–10.5)
nRBC: 0 % (ref 0.0–0.2)

## 2019-10-04 LAB — PROTIME-INR
INR: 1 (ref 0.8–1.2)
Prothrombin Time: 13.4 seconds (ref 11.4–15.2)

## 2019-10-04 LAB — URINALYSIS, COMPLETE (UACMP) WITH MICROSCOPIC
Bilirubin Urine: NEGATIVE
Glucose, UA: NEGATIVE mg/dL
Ketones, ur: NEGATIVE mg/dL
Nitrite: NEGATIVE
Protein, ur: 100 mg/dL — AB
RBC / HPF: >50 RBC/hpf — ABNORMAL HIGH (ref 0–5)
Specific Gravity, Urine: 1.018 (ref 1.005–1.030)
WBC, UA: >50 WBC/hpf — ABNORMAL HIGH (ref 0–5)
pH: 6 (ref 5.0–8.0)

## 2019-10-04 LAB — TYPE AND SCREEN
ABO/RH(D): B POS
Antibody Screen: NEGATIVE

## 2019-10-04 LAB — HEMOGLOBIN A1C
Hgb A1c MFr Bld: 5.4 % (ref 4.8–5.6)
Mean Plasma Glucose: 108.28 mg/dL

## 2019-10-04 LAB — SURGICAL PCR SCREEN
MRSA, PCR: NEGATIVE
Staphylococcus aureus: NEGATIVE

## 2019-10-04 LAB — BASIC METABOLIC PANEL
Anion gap: 11 (ref 5–15)
BUN: 23 mg/dL (ref 8–23)
CO2: 26 mmol/L (ref 22–32)
Calcium: 9.1 mg/dL (ref 8.9–10.3)
Chloride: 106 mmol/L (ref 98–111)
Creatinine, Ser: 0.95 mg/dL (ref 0.44–1.00)
GFR calc Af Amer: >60 mL/min (ref >60)
GFR calc non Af Amer: 58 mL/min — ABNORMAL LOW (ref >60)
Glucose, Bld: 96 mg/dL (ref 70–99)
Potassium: 4.2 mmol/L (ref 3.5–5.1)
Sodium: 143 mmol/L (ref 135–145)

## 2019-10-04 LAB — APTT: aPTT: 29 seconds (ref 24–36)

## 2019-10-04 NOTE — Pre-Procedure Instructions (Signed)
Secure Chat with Dr Bertell Maria   Me:  Dr Bertell Maria can you look at her EKG from today. She has one in Epic from 2018 for comparison. TY   Zak: EKG today looks fine today

## 2019-10-05 ENCOUNTER — Other Ambulatory Visit: Payer: Self-pay

## 2019-10-05 ENCOUNTER — Encounter: Payer: Self-pay | Admitting: Nurse Practitioner

## 2019-10-05 ENCOUNTER — Ambulatory Visit (INDEPENDENT_AMBULATORY_CARE_PROVIDER_SITE_OTHER): Payer: Medicare Other | Admitting: Nurse Practitioner

## 2019-10-05 VITALS — BP 147/83 | HR 84 | Temp 97.7°F | Ht 59.0 in | Wt 170.2 lb

## 2019-10-05 DIAGNOSIS — Z01818 Encounter for other preprocedural examination: Secondary | ICD-10-CM | POA: Diagnosis not present

## 2019-10-05 NOTE — Assessment & Plan Note (Addendum)
Scheduled for left TKA on 10/10/19.  Blood work drawn yesterday and all looks stable, will also check albumin as indicated on pre-operative clearance form from [facility].  EKG, chest x-ray, and UA ordered by [facility].  Will clear for surgery as long as albumin >3 and chest x-ray comes back clear.  Stopped aspirin and naproxen on 10/03/2019, advised to resume 7 days after surgery.  Patient to call with concerns in the meantime.

## 2019-10-05 NOTE — Pre-Procedure Instructions (Signed)
Notified Dr Mack Guise  Via secure chat labs available for review. Patient returning 3/5 for CXR.
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
## 2019-10-05 NOTE — Patient Instructions (Addendum)
Nice meeting you today!!  Remember to not take the Aleve or Aspirin until 7 days after surgery.  Good luck with your surgery!

## 2019-10-06 ENCOUNTER — Ambulatory Visit
Admission: RE | Admit: 2019-10-06 | Discharge: 2019-10-06 | Disposition: A | Discharge status: Home / Self Care | Payer: Medicare Other | Source: Ambulatory Visit | Attending: Orthopedic Surgery | Admitting: Orthopedic Surgery

## 2019-10-06 ENCOUNTER — Other Ambulatory Visit
Admission: RE | Admit: 2019-10-06 | Discharge: 2019-10-06 | Disposition: A | Discharge status: Home / Self Care | Payer: Medicare Other | Source: Ambulatory Visit | Attending: Orthopedic Surgery | Admitting: Orthopedic Surgery

## 2019-10-06 ENCOUNTER — Other Ambulatory Visit: Payer: Self-pay | Admitting: Orthopedic Surgery

## 2019-10-06 DIAGNOSIS — Z01818 Encounter for other preprocedural examination: Secondary | ICD-10-CM | POA: Insufficient documentation

## 2019-10-06 LAB — ALBUMIN: Albumin: 4.1 g/dL (ref 3.7–4.7)

## 2019-10-06 LAB — SARS CORONAVIRUS 2 (TAT 6-24 HRS): SARS Coronavirus 2: NEGATIVE

## 2019-10-10 ENCOUNTER — Ambulatory Visit: Payer: Medicare Other | Admitting: Anesthesiology

## 2019-10-10 ENCOUNTER — Encounter
Admission: AD | Disposition: A | Discharge status: Home Health | Payer: Self-pay | Source: Ambulatory Visit | Attending: Orthopedic Surgery

## 2019-10-10 ENCOUNTER — Inpatient Hospital Stay
Admission: AD | Admit: 2019-10-10 | Discharge: 2019-10-16 | DRG: 467 | Disposition: A | Discharge status: Home Health | Payer: Medicare Other | Source: Ambulatory Visit | Attending: Orthopedic Surgery | Admitting: Orthopedic Surgery

## 2019-10-10 ENCOUNTER — Other Ambulatory Visit: Payer: Self-pay

## 2019-10-10 ENCOUNTER — Observation Stay: Payer: Medicare Other

## 2019-10-10 ENCOUNTER — Encounter: Payer: Self-pay | Admitting: Orthopedic Surgery

## 2019-10-10 DIAGNOSIS — Z90710 Acquired absence of both cervix and uterus: Secondary | ICD-10-CM

## 2019-10-10 DIAGNOSIS — Y92239 Unspecified place in hospital as the place of occurrence of the external cause: Secondary | ICD-10-CM | POA: Diagnosis not present

## 2019-10-10 DIAGNOSIS — M25762 Osteophyte, left knee: Secondary | ICD-10-CM | POA: Diagnosis present

## 2019-10-10 DIAGNOSIS — Z803 Family history of malignant neoplasm of breast: Secondary | ICD-10-CM

## 2019-10-10 DIAGNOSIS — Y92234 Operating room of hospital as the place of occurrence of the external cause: Secondary | ICD-10-CM | POA: Diagnosis not present

## 2019-10-10 DIAGNOSIS — M9689 Other intraoperative and postprocedural complications and disorders of the musculoskeletal system: Secondary | ICD-10-CM | POA: Diagnosis not present

## 2019-10-10 DIAGNOSIS — R112 Nausea with vomiting, unspecified: Secondary | ICD-10-CM | POA: Diagnosis not present

## 2019-10-10 DIAGNOSIS — Z471 Aftercare following joint replacement surgery: Secondary | ICD-10-CM | POA: Diagnosis not present

## 2019-10-10 DIAGNOSIS — R579 Shock, unspecified: Secondary | ICD-10-CM | POA: Diagnosis not present

## 2019-10-10 DIAGNOSIS — Z823 Family history of stroke: Secondary | ICD-10-CM | POA: Diagnosis not present

## 2019-10-10 DIAGNOSIS — E785 Hyperlipidemia, unspecified: Secondary | ICD-10-CM | POA: Diagnosis present

## 2019-10-10 DIAGNOSIS — Z79899 Other long term (current) drug therapy: Secondary | ICD-10-CM

## 2019-10-10 DIAGNOSIS — M1712 Unilateral primary osteoarthritis, left knee: Secondary | ICD-10-CM | POA: Diagnosis not present

## 2019-10-10 DIAGNOSIS — Z7982 Long term (current) use of aspirin: Secondary | ICD-10-CM | POA: Diagnosis not present

## 2019-10-10 DIAGNOSIS — Z932 Ileostomy status: Secondary | ICD-10-CM | POA: Diagnosis not present

## 2019-10-10 DIAGNOSIS — Z87442 Personal history of urinary calculi: Secondary | ICD-10-CM | POA: Diagnosis not present

## 2019-10-10 DIAGNOSIS — T361X5A Adverse effect of cephalosporins and other beta-lactam antibiotics, initial encounter: Secondary | ICD-10-CM | POA: Diagnosis not present

## 2019-10-10 DIAGNOSIS — Z96652 Presence of left artificial knee joint: Secondary | ICD-10-CM

## 2019-10-10 DIAGNOSIS — K567 Ileus, unspecified: Secondary | ICD-10-CM | POA: Diagnosis not present

## 2019-10-10 DIAGNOSIS — Y658 Other specified misadventures during surgical and medical care: Secondary | ICD-10-CM | POA: Diagnosis not present

## 2019-10-10 DIAGNOSIS — I959 Hypotension, unspecified: Secondary | ICD-10-CM | POA: Diagnosis not present

## 2019-10-10 DIAGNOSIS — Z791 Long term (current) use of non-steroidal anti-inflammatories (NSAID): Secondary | ICD-10-CM | POA: Diagnosis not present

## 2019-10-10 DIAGNOSIS — Z01818 Encounter for other preprocedural examination: Secondary | ICD-10-CM

## 2019-10-10 DIAGNOSIS — M810 Age-related osteoporosis without current pathological fracture: Secondary | ICD-10-CM | POA: Diagnosis not present

## 2019-10-10 DIAGNOSIS — T84093A Other mechanical complication of internal left knee prosthesis, initial encounter: Secondary | ICD-10-CM | POA: Diagnosis not present

## 2019-10-10 DIAGNOSIS — Z881 Allergy status to other antibiotic agents status: Secondary | ICD-10-CM

## 2019-10-10 DIAGNOSIS — Y792 Prosthetic and other implants, materials and accessory orthopedic devices associated with adverse incidents: Secondary | ICD-10-CM | POA: Diagnosis not present

## 2019-10-10 HISTORY — PX: TOTAL KNEE ARTHROPLASTY: SHX125

## 2019-10-10 LAB — COMPREHENSIVE METABOLIC PANEL
ALT: 13 U/L (ref 0–44)
AST: 21 U/L (ref 15–41)
Albumin: 3.2 g/dL — ABNORMAL LOW (ref 3.5–5.0)
Alkaline Phosphatase: 39 U/L (ref 38–126)
Anion gap: 4 — ABNORMAL LOW (ref 5–15)
BUN: 13 mg/dL (ref 8–23)
CO2: 27 mmol/L (ref 22–32)
Calcium: 8 mg/dL — ABNORMAL LOW (ref 8.9–10.3)
Chloride: 105 mmol/L (ref 98–111)
Creatinine, Ser: 0.81 mg/dL (ref 0.44–1.00)
GFR calc Af Amer: >60 mL/min (ref >60)
GFR calc non Af Amer: >60 mL/min (ref >60)
Glucose, Bld: 135 mg/dL — ABNORMAL HIGH (ref 70–99)
Potassium: 3.6 mmol/L (ref 3.5–5.1)
Sodium: 136 mmol/L (ref 135–145)
Total Bilirubin: 0.7 mg/dL (ref 0.3–1.2)
Total Protein: 5.6 g/dL — ABNORMAL LOW (ref 6.5–8.1)

## 2019-10-10 LAB — CBC
HCT: 38.4 % (ref 36.0–46.0)
Hemoglobin: 12.9 g/dL (ref 12.0–15.0)
MCH: 29.4 pg (ref 26.0–34.0)
MCHC: 33.6 g/dL (ref 30.0–36.0)
MCV: 87.5 fL (ref 80.0–100.0)
Platelets: 253 10*3/uL (ref 150–400)
RBC: 4.39 MIL/uL (ref 3.87–5.11)
RDW: 12.8 % (ref 11.5–15.5)
WBC: 8.7 10*3/uL (ref 4.0–10.5)
nRBC: 0 % (ref 0.0–0.2)

## 2019-10-10 LAB — CREATININE, SERUM
Creatinine, Ser: 0.87 mg/dL (ref 0.44–1.00)
GFR calc Af Amer: >60 mL/min (ref >60)
GFR calc non Af Amer: >60 mL/min (ref >60)

## 2019-10-10 LAB — ABO/RH: ABO/RH(D): B POS

## 2019-10-10 LAB — GLUCOSE, CAPILLARY: Glucose-Capillary: 100 mg/dL — ABNORMAL HIGH (ref 70–99)

## 2019-10-10 LAB — PROTIME-INR
INR: 1 (ref 0.8–1.2)
Prothrombin Time: 13.2 seconds (ref 11.4–15.2)

## 2019-10-10 LAB — LACTIC ACID, PLASMA: Lactic Acid, Venous: 1.2 mmol/L (ref 0.5–1.9)

## 2019-10-10 LAB — TROPONIN I (HIGH SENSITIVITY): Troponin I (High Sensitivity): 3 ng/L (ref <18)

## 2019-10-10 LAB — BRAIN NATRIURETIC PEPTIDE: B Natriuretic Peptide: 13 pg/mL (ref 0.0–100.0)

## 2019-10-10 SURGERY — ARTHROPLASTY, KNEE, TOTAL
Anesthesia: Spinal | Site: Knee | Laterality: Left

## 2019-10-10 MED ORDER — FENTANYL CITRATE (PF) 100 MCG/2ML IJ SOLN
INTRAMUSCULAR | Status: DC | PRN
  Administered 2019-10-10: 50 ug via INTRAVENOUS

## 2019-10-10 MED ORDER — FENTANYL CITRATE (PF) 100 MCG/2ML IJ SOLN
INTRAMUSCULAR | Status: AC
  Filled 2019-10-10: qty 2

## 2019-10-10 MED ORDER — TRANEXAMIC ACID 1000 MG/10ML IV SOLN
INTRAVENOUS | Status: AC
  Filled 2019-10-10: qty 10

## 2019-10-10 MED ORDER — MIDAZOLAM HCL 5 MG/5ML IJ SOLN
INTRAMUSCULAR | Status: DC | PRN
  Administered 2019-10-10 (×2): 1 mg via INTRAVENOUS

## 2019-10-10 MED ORDER — CLINDAMYCIN PHOSPHATE 600 MG/50ML IV SOLN
600.0000 mg | INTRAVENOUS | Status: AC
  Administered 2019-10-10: 11:00:00 600 mg via INTRAVENOUS

## 2019-10-10 MED ORDER — CLINDAMYCIN PHOSPHATE 600 MG/50ML IV SOLN
INTRAVENOUS | Status: AC
  Administered 2019-10-11: 600 mg via INTRAVENOUS
  Filled 2019-10-10: qty 50

## 2019-10-10 MED ORDER — ROPINIROLE HCL 1 MG PO TABS
0.5000 mg | ORAL_TABLET | Freq: Every day | ORAL | Status: DC
  Administered 2019-10-11 – 2019-10-15 (×5): 0.5 mg via ORAL
  Filled 2019-10-10 (×3): qty 1
  Filled 2019-10-10: qty 2
  Filled 2019-10-10: qty 1
  Filled 2019-10-10 (×2): qty 2
  Filled 2019-10-10: qty 1

## 2019-10-10 MED ORDER — METHOCARBAMOL 500 MG PO TABS
500.0000 mg | ORAL_TABLET | Freq: Four times a day (QID) | ORAL | Status: DC | PRN
  Filled 2019-10-10: qty 1

## 2019-10-10 MED ORDER — FENTANYL CITRATE (PF) 100 MCG/2ML IJ SOLN: 25.0000 ug | INTRAMUSCULAR | Status: DC | PRN

## 2019-10-10 MED ORDER — NEOMYCIN-POLYMYXIN B GU 40-200000 IR SOLN
Status: AC
  Filled 2019-10-10: qty 20

## 2019-10-10 MED ORDER — MENTHOL 3 MG MT LOZG
1.0000 | LOZENGE | OROMUCOSAL | Status: DC | PRN
  Filled 2019-10-10: qty 9

## 2019-10-10 MED ORDER — CALCIUM CARBONATE-VITAMIN D 500-200 MG-UNIT PO TABS
2.0000 | ORAL_TABLET | Freq: Every day | ORAL | Status: DC
  Administered 2019-10-11 – 2019-10-16 (×4): 2 via ORAL
  Filled 2019-10-10 (×4): qty 2

## 2019-10-10 MED ORDER — SODIUM CHLORIDE FLUSH 0.9 % IV SOLN
INTRAVENOUS | Status: AC
  Filled 2019-10-10: qty 40

## 2019-10-10 MED ORDER — PROPOFOL 500 MG/50ML IV EMUL
INTRAVENOUS | Status: DC | PRN
  Administered 2019-10-10: 30 ug/kg/min via INTRAVENOUS

## 2019-10-10 MED ORDER — BUPIVACAINE HCL (PF) 0.5 % IJ SOLN
INTRAMUSCULAR | Status: DC | PRN
  Administered 2019-10-10: 2.8 mL

## 2019-10-10 MED ORDER — TRANEXAMIC ACID-NACL 1000-0.7 MG/100ML-% IV SOLN
1000.0000 mg | INTRAVENOUS | Status: AC
  Administered 2019-10-10: 12:00:00 1000 mg via INTRAVENOUS

## 2019-10-10 MED ORDER — TRAMADOL HCL 50 MG PO TABS
50.0000 mg | ORAL_TABLET | Freq: Four times a day (QID) | ORAL | Status: DC
  Administered 2019-10-10 – 2019-10-16 (×14): 50 mg via ORAL
  Filled 2019-10-10 (×14): qty 1

## 2019-10-10 MED ORDER — EPINEPHRINE PF 1 MG/ML IJ SOLN
INTRAMUSCULAR | Status: AC
  Filled 2019-10-10: qty 1

## 2019-10-10 MED ORDER — BUPIVACAINE LIPOSOME 1.3 % IJ SUSP
INTRAMUSCULAR | Status: AC
  Filled 2019-10-10: qty 20

## 2019-10-10 MED ORDER — CHLORHEXIDINE GLUCONATE CLOTH 2 % EX PADS
6.0000 | MEDICATED_PAD | Freq: Every day | CUTANEOUS | Status: DC
  Administered 2019-10-11: 6 via TOPICAL

## 2019-10-10 MED ORDER — MAGNESIUM CITRATE PO SOLN
1.0000 | Freq: Once | ORAL | Status: DC | PRN
  Filled 2019-10-10: qty 296

## 2019-10-10 MED ORDER — DOCUSATE SODIUM 100 MG PO CAPS
100.0000 mg | ORAL_CAPSULE | Freq: Two times a day (BID) | ORAL | Status: DC
  Administered 2019-10-10 – 2019-10-16 (×9): 100 mg via ORAL
  Filled 2019-10-10 (×11): qty 1

## 2019-10-10 MED ORDER — MORPHINE SULFATE (PF) 4 MG/ML IV SOLN
INTRAVENOUS | Status: AC
  Filled 2019-10-10: qty 1

## 2019-10-10 MED ORDER — PHYTONADIONE 5 MG PO TABS: 40.0000 mg | ORAL_TABLET | Freq: Once | ORAL | Status: DC

## 2019-10-10 MED ORDER — ASPIRIN EC 81 MG PO TBEC
81.0000 mg | DELAYED_RELEASE_TABLET | Freq: Every day | ORAL | Status: DC
  Administered 2019-10-10 – 2019-10-16 (×5): 81 mg via ORAL
  Filled 2019-10-10 (×5): qty 1

## 2019-10-10 MED ORDER — BUPIVACAINE HCL (PF) 0.25 % IJ SOLN
INTRAMUSCULAR | Status: AC
  Filled 2019-10-10: qty 60

## 2019-10-10 MED ORDER — SODIUM CHLORIDE 0.9 % IV SOLN: INTRAVENOUS | Status: DC

## 2019-10-10 MED ORDER — OXYCODONE HCL 5 MG PO TABS: 10.0000 mg | ORAL_TABLET | ORAL | Status: DC | PRN

## 2019-10-10 MED ORDER — ACETAMINOPHEN 10 MG/ML IV SOLN
INTRAVENOUS | Status: AC
  Filled 2019-10-10: qty 100

## 2019-10-10 MED ORDER — PHENOL 1.4 % MT LIQD
1.0000 | OROMUCOSAL | Status: DC | PRN
  Filled 2019-10-10: qty 177

## 2019-10-10 MED ORDER — PHYTONADIONE 5 MG PO TABS
40.0000 mg | ORAL_TABLET | Freq: Once | ORAL | Status: DC
  Filled 2019-10-10: qty 8

## 2019-10-10 MED ORDER — PROPOFOL 500 MG/50ML IV EMUL
INTRAVENOUS | Status: AC
  Filled 2019-10-10: qty 50

## 2019-10-10 MED ORDER — SENNOSIDES-DOCUSATE SODIUM 8.6-50 MG PO TABS
1.0000 | ORAL_TABLET | Freq: Every evening | ORAL | Status: DC | PRN
  Filled 2019-10-10: qty 1

## 2019-10-10 MED ORDER — ONDANSETRON HCL 4 MG/2ML IJ SOLN: 4.0000 mg | Freq: Once | INTRAMUSCULAR | Status: DC | PRN

## 2019-10-10 MED ORDER — CHLORHEXIDINE GLUCONATE CLOTH 2 % EX PADS
6.0000 | MEDICATED_PAD | Freq: Once | CUTANEOUS | Status: AC
  Administered 2019-10-10: 6 via TOPICAL

## 2019-10-10 MED ORDER — ACETAMINOPHEN 325 MG PO TABS: 325.0000 mg | ORAL_TABLET | Freq: Four times a day (QID) | ORAL | Status: DC | PRN

## 2019-10-10 MED ORDER — MIDAZOLAM HCL 2 MG/2ML IJ SOLN
INTRAMUSCULAR | Status: AC
  Filled 2019-10-10: qty 2

## 2019-10-10 MED ORDER — BUPIVACAINE HCL (PF) 0.5 % IJ SOLN
INTRAMUSCULAR | Status: AC
  Filled 2019-10-10: qty 10

## 2019-10-10 MED ORDER — SODIUM CHLORIDE 0.9 % IV BOLUS
1000.0000 mL | Freq: Once | INTRAVENOUS | Status: AC
  Administered 2019-10-13: 1000 mL via INTRAVENOUS

## 2019-10-10 MED ORDER — ADULT MULTIVITAMIN W/MINERALS CH
1.0000 | ORAL_TABLET | Freq: Every day | ORAL | Status: DC
  Administered 2019-10-11 – 2019-10-16 (×4): 1 via ORAL
  Filled 2019-10-10 (×4): qty 1

## 2019-10-10 MED ORDER — LACTATED RINGERS IV SOLN: INTRAVENOUS | Status: DC

## 2019-10-10 MED ORDER — CALCIUM CARBONATE-VITAMIN D 500-200 MG-UNIT PO TABS: 2.0000 | ORAL_TABLET | Freq: Every day | ORAL | Status: DC

## 2019-10-10 MED ORDER — DIPHENHYDRAMINE HCL 12.5 MG/5ML PO ELIX
12.5000 mg | ORAL_SOLUTION | ORAL | Status: DC | PRN
  Filled 2019-10-10: qty 5

## 2019-10-10 MED ORDER — OXYCODONE HCL 5 MG PO TABS
5.0000 mg | ORAL_TABLET | ORAL | Status: DC | PRN
  Administered 2019-10-10 – 2019-10-13 (×2): 5 mg via ORAL
  Filled 2019-10-10 (×2): qty 1

## 2019-10-10 MED ORDER — BISACODYL 10 MG RE SUPP
10.0000 mg | Freq: Every day | RECTAL | Status: DC | PRN
  Filled 2019-10-10: qty 1

## 2019-10-10 MED ORDER — ONDANSETRON HCL 4 MG PO TABS: 4.0000 mg | ORAL_TABLET | Freq: Four times a day (QID) | ORAL | Status: DC | PRN

## 2019-10-10 MED ORDER — TRANEXAMIC ACID-NACL 1000-0.7 MG/100ML-% IV SOLN
INTRAVENOUS | Status: AC
  Filled 2019-10-10: qty 100

## 2019-10-10 MED ORDER — ACETAMINOPHEN 10 MG/ML IV SOLN
INTRAVENOUS | Status: DC | PRN
  Administered 2019-10-10: 1000 mg via INTRAVENOUS

## 2019-10-10 MED ORDER — PHENYLEPHRINE HCL (PRESSORS) 10 MG/ML IV SOLN
INTRAVENOUS | Status: DC | PRN
  Administered 2019-10-10: 100 ug via INTRAVENOUS

## 2019-10-10 MED ORDER — CALCIUM-VITAMIN D-VITAMIN K 750-500-40 MG-UNT-MCG PO TABS: ORAL_TABLET | Freq: Every day | ORAL | Status: DC

## 2019-10-10 MED ORDER — SODIUM CHLORIDE 0.9 % IV SOLN
INTRAVENOUS | Status: DC | PRN
  Administered 2019-10-10: 40 ug/min via INTRAVENOUS

## 2019-10-10 MED ORDER — CEFAZOLIN SODIUM-DEXTROSE 1-4 GM/50ML-% IV SOLN
1.0000 g | Freq: Four times a day (QID) | INTRAVENOUS | Status: DC
  Administered 2019-10-10 – 2019-10-11 (×2): 1 g via INTRAVENOUS
  Filled 2019-10-10 (×4): qty 50

## 2019-10-10 MED ORDER — METHOCARBAMOL 1000 MG/10ML IJ SOLN
500.0000 mg | Freq: Four times a day (QID) | INTRAVENOUS | Status: DC | PRN
  Filled 2019-10-10: qty 5

## 2019-10-10 MED ORDER — FAMOTIDINE 20 MG PO TABS: 20.0000 mg | ORAL_TABLET | Freq: Once | ORAL | Status: DC

## 2019-10-10 MED ORDER — CEFAZOLIN SODIUM-DEXTROSE 2-4 GM/100ML-% IV SOLN
INTRAVENOUS | Status: AC
  Filled 2019-10-10: qty 100

## 2019-10-10 MED ORDER — GABAPENTIN 300 MG PO CAPS
300.0000 mg | ORAL_CAPSULE | Freq: Three times a day (TID) | ORAL | Status: DC
  Administered 2019-10-10 – 2019-10-16 (×14): 300 mg via ORAL
  Filled 2019-10-10 (×15): qty 1

## 2019-10-10 MED ORDER — HYDROMORPHONE HCL 1 MG/ML IJ SOLN: 0.5000 mg | INTRAMUSCULAR | Status: DC | PRN

## 2019-10-10 MED ORDER — SODIUM CHLORIDE 0.9 % IV SOLN
INTRAVENOUS | Status: DC | PRN
  Administered 2019-10-10: 13:00:00 60 mL

## 2019-10-10 MED ORDER — ENOXAPARIN SODIUM 40 MG/0.4ML ~~LOC~~ SOLN
40.0000 mg | SUBCUTANEOUS | Status: DC
  Administered 2019-10-11: 40 mg via SUBCUTANEOUS
  Filled 2019-10-10: qty 0.4

## 2019-10-10 MED ORDER — ONDANSETRON HCL 4 MG/2ML IJ SOLN
4.0000 mg | Freq: Four times a day (QID) | INTRAMUSCULAR | Status: DC | PRN
  Administered 2019-10-14 (×3): 4 mg via INTRAVENOUS
  Filled 2019-10-10 (×3): qty 2

## 2019-10-10 MED ORDER — CEFAZOLIN SODIUM-DEXTROSE 2-4 GM/100ML-% IV SOLN
2.0000 g | INTRAVENOUS | Status: AC
  Administered 2019-10-10: 2 g via INTRAVENOUS

## 2019-10-10 MED ORDER — ACETAMINOPHEN 500 MG PO TABS
1000.0000 mg | ORAL_TABLET | Freq: Four times a day (QID) | ORAL | Status: AC
  Administered 2019-10-10 – 2019-10-11 (×4): 1000 mg via ORAL
  Filled 2019-10-10 (×4): qty 2

## 2019-10-10 SURGICAL SUPPLY — 67 items
BLADE SAW 90X13X1.19 OSCILLAT (BLADE) ×3 IMPLANT
BLADE SAW 90X25X1.19 OSCILLAT (BLADE) ×3 IMPLANT
CANISTER SUCT 3000ML PPV (MISCELLANEOUS) ×3 IMPLANT
CEMENT HV SMART SET (Cement) ×6 IMPLANT
CEMENT TIBIA MBT SIZE 2.5 (Knees) ×1 IMPLANT
CNTNR SPEC 2.5X3XGRAD LEK (MISCELLANEOUS) ×1
CONT SPEC 4OZ STER OR WHT (MISCELLANEOUS) ×2
CONT SPEC 4OZ STRL OR WHT (MISCELLANEOUS) ×1
CONTAINER SPEC 2.5X3XGRAD LEK (MISCELLANEOUS) ×1 IMPLANT
COOLER POLAR GLACIER W/PUMP (MISCELLANEOUS) ×3 IMPLANT
COVER WAND RF STERILE (DRAPES) ×3 IMPLANT
CUFF TOURN SGL QUICK 24 (TOURNIQUET CUFF)
CUFF TOURN SGL QUICK 30 (TOURNIQUET CUFF) ×3
CUFF TRNQT CYL 24X4X16.5-23 (TOURNIQUET CUFF) IMPLANT
CUFF TRNQT CYL 30X4X21-28X (TOURNIQUET CUFF) ×1 IMPLANT
DRAPE 3/4 80X56 (DRAPES) ×6 IMPLANT
DRAPE IMP U-DRAPE 54X76 (DRAPES) ×6 IMPLANT
DRAPE INCISE IOBAN 66X60 STRL (DRAPES) ×3 IMPLANT
DRAPE SURG 17X11 SM STRL (DRAPES) ×6 IMPLANT
DRSG OPSITE POSTOP 4X12 (GAUZE/BANDAGES/DRESSINGS) ×3 IMPLANT
DRSG OPSITE POSTOP 4X14 (GAUZE/BANDAGES/DRESSINGS) IMPLANT
DURAPREP 26ML APPLICATOR (WOUND CARE) ×9 IMPLANT
ELECT REM PT RETURN 9FT ADLT (ELECTROSURGICAL) ×3
ELECTRODE REM PT RTRN 9FT ADLT (ELECTROSURGICAL) ×1 IMPLANT
FEMUR SIGMA PS SZ 2.5 L (Knees) ×3 IMPLANT
GAUZE SPONGE 4X4 12PLY STRL (GAUZE/BANDAGES/DRESSINGS) ×3 IMPLANT
GLOVE BIOGEL PI IND STRL 9 (GLOVE) ×1 IMPLANT
GLOVE BIOGEL PI INDICATOR 9 (GLOVE) ×2
GLOVE SURG 9.0 ORTHO LTXF (GLOVE) ×6 IMPLANT
GOWN STRL REUS TWL 2XL XL LVL4 (GOWN DISPOSABLE) ×3 IMPLANT
GOWN STRL REUS W/ TWL LRG LVL3 (GOWN DISPOSABLE) ×1 IMPLANT
GOWN STRL REUS W/ TWL LRG LVL4 (GOWN DISPOSABLE) ×1 IMPLANT
GOWN STRL REUS W/TWL LRG LVL3 (GOWN DISPOSABLE) ×3
GOWN STRL REUS W/TWL LRG LVL4 (GOWN DISPOSABLE) ×2
HOLDER FOLEY CATH W/STRAP (MISCELLANEOUS) ×3 IMPLANT
IMMBOLIZER KNEE 19 BLUE UNIV (SOFTGOODS) ×3 IMPLANT
KIT TURNOVER KIT A (KITS) ×3 IMPLANT
MANIFOLD NEPTUNE II (INSTRUMENTS) ×3 IMPLANT
NDL SAFETY ECLIPSE 18X1.5 (NEEDLE) ×1 IMPLANT
NEEDLE HYPO 18GX1.5 SHARP (NEEDLE) ×3
NEEDLE HYPO 22GX1.5 SAFETY (NEEDLE) ×3 IMPLANT
NEEDLE SPNL 20GX3.5 QUINCKE YW (NEEDLE) ×3 IMPLANT
NS IRRIG 1000ML POUR BTL (IV SOLUTION) ×3 IMPLANT
PACK TOTAL KNEE (MISCELLANEOUS) ×3 IMPLANT
PAD WRAPON POLAR KNEE (MISCELLANEOUS) ×1 IMPLANT
PATELLA DOME PFC 38MM (Knees) ×3 IMPLANT
PENCIL SMOKE ULTRAEVAC 22 CON (MISCELLANEOUS) ×3 IMPLANT
PIN STEINMAN FIXATION KNEE (PIN) ×3 IMPLANT
PLATE ROT INSERT 10MM SIZE 4 (Plate) ×3 IMPLANT
PULSAVAC PLUS IRRIG FAN TIP (DISPOSABLE) ×3
SOL .9 NS 3000ML IRR  AL (IV SOLUTION) ×3
SOL .9 NS 3000ML IRR UROMATIC (IV SOLUTION) ×1 IMPLANT
SPONGE LAP 18X18 RF (DISPOSABLE) IMPLANT
STAPLER SKIN PROX 35W (STAPLE) ×3 IMPLANT
SUCTION FRAZIER HANDLE 10FR (MISCELLANEOUS) ×2
SUCTION TUBE FRAZIER 10FR DISP (MISCELLANEOUS) ×1 IMPLANT
SUT ETHIBOND NAB CT1 #1 30IN (SUTURE) ×6 IMPLANT
SUT VIC AB 0 CT1 36 (SUTURE) ×3 IMPLANT
SUT VIC AB 2-0 CT1 (SUTURE) ×6 IMPLANT
SYR 20ML LL LF (SYRINGE) ×3 IMPLANT
SYR 30ML LL (SYRINGE) ×6 IMPLANT
TIBIA MBT CEMENT SIZE 2.5 (Knees) ×3 IMPLANT
TIP FAN IRRIG PULSAVAC PLUS (DISPOSABLE) ×1 IMPLANT
TOWER CARTRIDGE SMART MIX (DISPOSABLE) ×3 IMPLANT
TRAY FOLEY MTR SLVR 16FR STAT (SET/KITS/TRAYS/PACK) ×3 IMPLANT
TUBE SUCT KAM VAC (TUBING) ×3 IMPLANT
WRAPON POLAR PAD KNEE (MISCELLANEOUS) ×3

## 2019-10-10 NOTE — Evaluation (Signed)
Physical Therapy Evaluation Patient Details Name: Victoria Peterson MRN: [id number] DOB: 1940-09-12 Today's Date: 10/10/2019   History of Present Illness  79 y/o female s/p L TKA 10/10/19.  Clinical Impression  Pt did exceedingly well with first PT session POD0.  With no warm up she was able to do L SLR and hold for ~45 seconds while PT removed KI.  She had nearly 100 deg of AROM flexion, showed solid quad set and was easily able to get herself to EOB. She did struggle to shift all the weight onto R to rise to standing (as L knee was in [place] and unable to assist) but did do so w/o physical assist. Pt reports 0/10 pain and during ROM/exercises just some "discomfort."  Pt was able to ambulate 59f with confident and consistent cadence, minimal UE/walker use and had no real fatigue.  Overall pt did very well and is eager to do steps tomorrow and then d/c to daughter's home.      Follow Up Recommendations Follow surgeon's recommendation for DC plan and follow-up therapies    Equipment Recommendations  Rolling walker with 5" wheels    Recommendations for Other Services       Precautions / Restrictions Precautions Precautions: Fall;Knee Precaution Booklet Issued: Yes (comment) Precaution Comments: HEP hand out Required Braces or Orthoses: Knee Immobilizer - Left Restrictions Weight Bearing Restrictions: Yes LLE Weight Bearing: Weight bearing as tolerated      Mobility  Bed Mobility Overal bed mobility: Independent             General bed mobility comments: Pt able to get herself to sitting EOB w/o hesitation, though she did have brief lightheadness in first getting to upright  Transfers Overall transfer level: Needs assistance Equipment used: Rolling walker (2 wheeled) Transfers: Sit to/from Stand Sit to Stand: Min guard         General transfer comment: Pt needed some extra cuing for sequencing and set up and momentum gathering rocking to attain standing but able to do so w/o  phyiscal assist  Ambulation/Gait Ambulation/Gait assistance: Supervision Gait Distance (Feet): 75 Feet Assistive device: Rolling walker (2 wheeled)       General Gait Details: Pt was quickly able to assume consistent cadence and maintain walker momentum with good WBing tolerance and minimal UE reliance.  Pt with no c/o fatigue and reports no increased pain.  Overall a very good first bout of ambulation POD0.  Stairs            Wheelchair Mobility    Modified Rankin (Stroke Patients Only)       Balance Overall balance assessment: Modified Independent                                           Pertinent Vitals/Pain Pain Assessment: No/denies pain("just discomfort" during exercises)    Home Living Family/patient expects to be discharged to:: Private residence Living Arrangements: Other relatives(pt lives alone, but will be going on daughter's at d/c) Available Help at Discharge: Family;Available 24 hours/day   Home Access: Stairs to enter Entrance Stairs-Rails: Right Entrance Stairs-Number of Steps: 2   Home Equipment: Cane - single point      Prior Function Level of Independence: Independent         Comments: Pt with recent R knee pain limiting mobility, but is generally able to stay active  Hand Dominance        Extremity/Trunk Assessment   Upper Extremity Assessment Upper Extremity Assessment: Overall WFL for tasks assessed    Lower Extremity Assessment Lower Extremity Assessment: Overall WFL for tasks assessed(easily does SLRs, solid quad set, minimal post-op limitation)       Communication   Communication: No difficulties  Cognition Arousal/Alertness: Awake/alert Behavior During Therapy: WFL for tasks assessed/performed Overall Cognitive Status: Within Functional Limits for tasks assessed                                        General Comments      Exercises Total Joint Exercises Ankle  Circles/Pumps: AROM;10 reps Quad Sets: Strengthening;10 reps Heel Slides: Strengthening;10 reps(with resisted leg extensions) Hip ABduction/ADduction: Strengthening;10 reps Straight Leg Raises: Strengthening;15 reps(able to easily lift and hold L LE ) Knee Flexion: PROM;5 reps Goniometric ROM: 0-102 (AROM to 99)   Assessment/Plan    PT Assessment Patient needs continued PT services  PT Problem List Decreased strength;Decreased range of motion;Decreased activity tolerance;Decreased balance;Decreased mobility;Decreased coordination;Decreased safety awareness;Decreased knowledge of use of DME;Pain       PT Treatment Interventions Gait training;Stair training;Functional mobility training;DME instruction;Therapeutic activities;Therapeutic exercise;Balance training;Neuromuscular re-education;Cognitive remediation;Patient/family education    PT Goals (Current goals can be found in the Care Plan section)  Acute Rehab PT Goals Patient Stated Goal: go home PT Goal Formulation: With patient Time For Goal Achievement: 10/24/19 Potential to Achieve Goals: Good    Frequency BID   Barriers to discharge        Co-evaluation               AM-PAC PT "6 Clicks" Mobility  Outcome Measure Help needed turning from your back to your side while in a flat bed without using bedrails?: None Help needed moving from lying on your back to sitting on the side of a flat bed without using bedrails?: None Help needed moving to and from a bed to a chair (including a wheelchair)?: None Help needed standing up from a chair using your arms (e.g., wheelchair or bedside chair)?: None Help needed to walk in hospital room?: None Help needed climbing 3-5 steps with a railing? : A Little 6 Click Score: 23    End of Session Equipment Utilized During Treatment: Gait belt Activity Tolerance: Patient tolerated treatment well Patient left: with chair alarm set;with call bell/phone within reach Nurse  Communication: Mobility status PT Visit Diagnosis: Muscle weakness (generalized) (M62.81);Difficulty in walking, not elsewhere classified (R26.2);Pain Pain - Right/Left: Left Pain - part of body: Knee    Time: 6063-0160 PT Time Calculation (min) (ACUTE ONLY): 44 min   Charges:   PT Evaluation $PT Eval Low Complexity: 1 Low PT Treatments $Gait Training: 8-22 mins $Therapeutic Exercise: 8-22 mins        Kreg Shropshire, DPT 10/10/2019, 5:12 PM

## 2019-10-10 NOTE — Progress Notes (Signed)
Chaplain provided emotional and spiritual support to Victoria Peterson and her daughter as she transported to ICU for observation. Victoria Peterson's laughter was a gift to the space. Prayer of thanksgiving and comfort were offered at ICU beside with Victoria Peterson and her daughter. Victoria Peterson exhibits no emotional or spiritual distress. She is aware of her situation and considers her ICU observation a move to the "suite." Additional, spiritual care is available upon request.

## 2019-10-10 NOTE — Progress Notes (Signed)
Pt up in [facility] Chair after PT. Pt  AOX4. As RN was reviewing medications with patient and hanging a new bag of NS patient began complaining of dizziness and appeared weak and coloring became pale.  Pt pulses thready and weak however patient remained oriented and awake. BP checked by tech with Dinamap and SBP was 57/43 (other VS information not retrievable from the dinamap), flattened the chair and FU BP was 45/35 (other information not retrievable from Dinamap). Pressed CODE Blue button after attempting to contact nursing station via telephone. Opened up NS bag at 960m and began infusing NS. Other staff reported to bedside including ICU RN, MD, 1A Charge RN, HCitigroupand multiple floor staff who assisted in moving patient to bed from chair. Pt transported to ICU. Final BP 79/49 with 135 HR before transport with HOB flat. BG 100.

## 2019-10-10 NOTE — Op Note (Signed)
DATE OF SURGERY:  10/10/2019 TIME: 1:36 PM  PATIENT NAME:  Victoria Peterson   AGE: 79 y.o.    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  EBL:  50  TOURNIQUET TIME:   97 minutes  PREOPERATIVE INDICATIONS:  Victoria Peterson is an 79 y.o. 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  Their knee pain significantly impacts their activity of daily living.  Radiographs have demonstrated tricompartmental osteoarthritis joint space narrowing, osteophytes,subchondral sclerosis and cyst formation.  The risks, benefits, and alternatives were discussed at length including but not limited to the risks of infection, bleeding, nerve or blood vessel injury, knee stiffness, fracture, dislocation, loosening or failure of the hardware and the need for further surgery. Medical risks include but not limited to DVT and pulmonary embolism, myocardial infarction, stroke, pneumonia, respiratory failure and death. I discussed these risks with the patient in my office prior to the date of surgery. They understood these risks and were willing to proceed.  OPERATIVE FINDINGS AND UNIQUE ASPECTS OF THE CASE:  Tricompartmental osteoarthritis with exposed bone and large osteophytes.  OPERATIVE DESCRIPTION:  The patient was brought to the operative room and placed in a supine position after undergoing placement of a spinal anesthetic.  A Foley catheter was placed.  IV antibiotics were given. Patient  received ancef 2 gram and clindamycin 641m IV. The patient was also given tranexamic acid given that she had no history of MI, stroke or blood clots.  The lower extremity was prepped and draped in the usual sterile fashion.  A time out was performed to verify the patient's name, date of birth, medical record number, correct site of surgery and correct procedure to be performed. The timeout was also used to confirm the patient received antibiotics and that appropriate instruments, implants and radiographs studies were available in the room.  The leg was elevated and exsanguinated with an Esmarch and the tourniquet was inflated to 275 mmHg for 96 minutes..  A midline incision was made over the left knee. Full-thickness skin flaps were developed. A medial parapatellar arthrotomy was then made and the patella everted and the knee was brought into 90 of flexion. Hoffa's fat pad along with the cruciate ligaments and medial and lateral menisci were resected.   The distal femoral intramedullary canal was opened with a drill and the intramedullary distal femoral cutting jig was inserted into the femoral canal pinned into position. It was set at 5 degrees resecting 10 mm off the distal femur.  Care was taken to protect the collateral ligaments during distal femoral resection.  The distal femoral resection was performed with an oscillating saw. The femoral cutting guide was then removed.  The extramedullary tibial cutting guide was then placed using the anterior tibial crest and second ray of the foot as a references.  The tibial cutting guide was adjusted to allow for appropriate posterior slope.  The tibial cutting block was pinned into position. The slotted stylus was used to measure the proximal tibial resection of 10 mm off the high lateral  side.  The tibial long rod alignment guide was then used to confirm position of the cutting block. A third cross pin through the tibial cutting block was then drilled into  position to allow for rotational stability. Care was taken during the tibial resection to protect the medial and collateral ligaments.  The resected tibial bone was removed along with the posterior horns of the menisci.  The PCL was sacrificed.  Extension gap was measured with a spacer block and alignment and extension was confirmed using a long alignment rod.  The attention was then turned back to the femur. The posterior referencing distal femoral sizing guide was applied to the distal femur.  The femur was sized to be a size 2.5. Rotation of the referencing guide was checked with the epicondylar axis and Whitesides line. Then the 4-in-1 cutting jig was then applied to the distal femur. A stylus was used to confirm that the anterior femur would not be notched.   Then the anterior, posterior and chamfer femoral cuts were then made with an oscillating saw.  The flexion gap was then measured with a flexion spacer block and long alignment rod and was found to be symmetric with the extension gap and perpendicular to mechanical axis of the tibia.  The distal femoral preparation was completed by performing the posterior stabilized box cut using the cutting block. The entry site for the intramedullary femoral guide was filled with autologous bone graft from bone previously resected earlier in the case.  The proximal tibia plateau was then sized with trial trays. The best coverage was achieved with a size 2.5. This tibial tray was then pinned into position. The proximal tibia was then prepared with the reamer and keel punch.  After tibial preparation was completed, all trial components were inserted with polyethylene trials.  The knee was found to have excellent balance and full motion with a size 10 mm tibial polyethylene insert..    The attention was then turned to preparation of the patella. The thickness of the patella was measured with a caliper, the diameter measured with the patella templates.  The patella  resection was then made with an oscillating saw using the patella cutting guide.  3 peg holes for the patella component were then drilled. The trial patella was then placed. Knee was taken through a full range of motion and deemed to be stable with the trial components. All trial components were then removed. The knee capsule was then injected with Exparel.  The knee joint capsule was injected with a mixture of quarter percent Marcaine, Toradol and morphine to assist with postoperative pain relief.  The joint was copiously irrigated with pulse lavage.  The final total knee arthroplasty components were then cemented into place with a 10 mm trial polyethylene insert and all excess methylmethacrylate was removed.  The joint was again copiously irrigated. After the cement had hardened the knee was again taken through a full range of motion. It was felt to be most stable with the 10 mm tibial polyethylene insert. The actual tibial polyethylene insert was then placed.   The knee was taken through a range of motion and the patella tracked well and the knee was again irrigated copiously.    The medial arthrotomy was closed with #1 Ethibond. The subcutaneous tissue closed with 0 and 2-0 vicryl, and skin approximated with staples.  A dry sterile and compressive dressing was applied.  A Polar Care was applied to the operative knee along with a knee immobilizer.  The patient was awakened and brought to the PACU in stable and satisfactory condition.  All sharp, lap and instrument counts were correct at the conclusion the case. I spoke with the patient's daughter by phone from the PACU to let her know the case had been performed without complication and the patient was stable in recovery room.

## 2019-10-10 NOTE — Progress Notes (Signed)
  Subjective:  POST-OP CHECK.  Patient reports left knee pain as mild.  Patient did well with PT this afternoon.  She is tolerating a PO diet and is sitting up in a chair eating dinner.  Objective:   VITALS:   Vitals:   10/10/19 1357 10/10/19 1427 10/10/19 1545 10/10/19 1614  BP: 111/75 (!) 147/83 138/90 130/84  Pulse: (!) 101 94 84 77  Resp: 19 (!) 22 18 17   Temp:  (!) 97.4 F (36.3 C) 97.7 F (36.5 C)   TempSrc:   Oral Oral  SpO2: 96% 100% 100% 100%    PHYSICAL EXAM: Left lower extremity Neurovascular intact Sensation intact distally Intact pulses distally Dorsiflexion/Plantar flexion intact Incision: dressing C/D/I Compartment soft  LABS  Results for orders placed or performed during the hospital encounter of 10/10/19 (from the past 24 hour(s))  ABO/Rh     Status: None   Collection Time: 10/10/19 10:09 AM  Result Value Ref Range   ABO/RH(D)      B POS Performed at [facility], [address] [place], [place] [address]   CBC     Status: None   Collection Time: 10/10/19  4:56 PM  Result Value Ref Range   WBC 8.7 4.0 - 10.5 K/uL   RBC 4.39 3.87 - 5.11 MIL/uL   Hemoglobin 12.9 12.0 - 15.0 g/dL   HCT 38.4 36.0 - 46.0 %   MCV 87.5 80.0 - 100.0 fL   MCH 29.4 26.0 - 34.0 pg   MCHC 33.6 30.0 - 36.0 g/dL   RDW 12.8 11.5 - 15.5 %   Platelets 253 150 - 400 K/uL   nRBC 0.0 0.0 - 0.2 %  Creatinine, serum     Status: None   Collection Time: 10/10/19  4:56 PM  Result Value Ref Range   Creatinine, Ser 0.87 0.44 - 1.00 mg/dL   GFR calc non Af Amer >60 >60 mL/min   GFR calc Af Amer >60 >60 mL/min    DG Knee Left Port  Result Date: 10/10/2019 CLINICAL DATA:  Postop left knee EXAM: PORTABLE LEFT KNEE - 1-2 VIEW COMPARISON:  None. FINDINGS: Patient is status post left knee replacement. Skin staples are noted. Femoral and tibial components are in good position. No other acute abnormalities. IMPRESSION: Left knee replacement as above. Electronically Signed   By:  Dorise Bullion III M.D   On: 10/10/2019 14:33    Assessment/Plan: Day of Surgery   Active Problems:   S/P total knee arthroplasty, left  Post-op xrays show components are well positioned.  Continue PT tomorrow.  Foley will be removed in the AM.  Patient will complete 24 hours of post-op antibiotics.  Lovenox to start tomorrow.   Labs will be checked in the AM.    Thornton Park , MD 10/10/2019, 5:14 PM

## 2019-10-10 NOTE — Significant Event (Signed)
Rapid Response Event Note  Overview: Time Called: 1732 Arrival Time: 5183 Event Type: Cardiac  Initial Focused Assessment:  Code Blue called overhead on this patient at 1732 hours. Upon arrival the patient is awake and conversive, answering questions appropriately. Systolic BPs 35O -> 25P -> 50s -> 70s -> 60s. Patient also tachycardic and diaphoretic. 1L NS bolus started per ED MD, Dr. Ellender Hose. Patient transferred to ICU 6 for closer monitoring.  Once on the unit, this RN called Dr. Mack Guise who agreed with the plan of care. Dr. Mortimer Fries was called and consulted to medically manage the patient.  Interventions:  Transferred to ICU 6. PCCM consulted.  Plan of Care (if not transferred):  Event Summary: Name of Physician Notified: Mack Guise at 1757  Name of Consulting Physician Notified: Dr. Mortimer Fries at [facility]  Outcome: Transferred (Comment)  Event End Time: [place]

## 2019-10-10 NOTE — Anesthesia Preprocedure Evaluation (Signed)
Anesthesia Evaluation  Patient identified by MRN, date of birth, ID band Patient awake    Reviewed: Allergy & Precautions, NPO status , Patient's Chart, lab work & pertinent test results  History of Anesthesia Complications Negative for: history of anesthetic complications  Airway Mallampati: III       Dental   Pulmonary neg sleep apnea, neg COPD, Not current smoker,           Cardiovascular (-) hypertension(-) Past MI and (-) CHF (-) dysrhythmias (-) Valvular Problems/Murmurs     Neuro/Psych neg Seizures    GI/Hepatic Neg liver ROS, neg GERD  ,  Endo/Other  neg diabetes  Renal/GU negative Renal ROS     Musculoskeletal   Abdominal   Peds  Hematology   Anesthesia Other Findings   Reproductive/Obstetrics                             Anesthesia Physical Anesthesia Plan  ASA: II  Anesthesia Plan: Spinal   Post-op Pain Management:    Induction:   PONV Risk Score and Plan:   Airway Management Planned: Nasal Cannula  Additional Equipment:   Intra-op Plan:   Post-operative Plan:   Informed Consent: I have reviewed the patients History and Physical, chart, labs and discussed the procedure including the risks, benefits and alternatives for the proposed anesthesia with the patient or authorized representative who has indicated his/her understanding and acceptance.       Plan Discussed with:   Anesthesia Plan Comments:         Anesthesia Quick Evaluation

## 2019-10-10 NOTE — Anesthesia Procedure Notes (Signed)
Spinal  Patient location during procedure: OR Start time: 10/10/2019 11:04 AM Staffing Performed: resident/CRNA  Anesthesiologist: Gunnar Fusi, MD Resident/CRNA: Phynix Horton, Einar Grad, CRNA Preanesthetic Checklist Completed: patient identified, IV checked, site marked, risks and benefits discussed, surgical consent, monitors and equipment checked, pre-op evaluation and timeout performed Spinal Block Patient position: sitting Prep: DuraPrep Patient monitoring: heart rate, cardiac monitor, continuous pulse ox and blood pressure Approach: midline Location: L3-4 Injection technique: single-shot Needle Needle type: Sprotte  Needle gauge: 24 G Needle length: 9 cm Assessment Sensory level: T6 Additional Notes + clear CSF w/ good flow, - heme, -parathesia, T6 level within 10 minutes
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
## 2019-10-10 NOTE — Transfer of Care (Signed)
Immediate Anesthesia Transfer of Care Note  Patient: Victoria Peterson  Procedure(s) Performed: TOTAL KNEE ARTHROPLASTY (Left Knee)  Patient Location: PACU  Anesthesia Type:Spinal  Level of Consciousness: awake, alert  and oriented  Airway & Oxygen Therapy: Patient Spontanous Breathing  Post-op Assessment: Report given to RN and Post -op Vital signs reviewed and stable  Post vital signs: Reviewed and stable  Last Vitals:  Vitals Value Taken Time  BP    Temp    Pulse    Resp    SpO2      Last Pain:  Vitals:   10/10/19 1012  TempSrc: Temporal  PainSc: 5          Complications: No apparent anesthesia complications

## 2019-10-11 LAB — BASIC METABOLIC PANEL
Anion gap: 8 (ref 5–15)
BUN: 15 mg/dL (ref 8–23)
CO2: 25 mmol/L (ref 22–32)
Calcium: 8.1 mg/dL — ABNORMAL LOW (ref 8.9–10.3)
Chloride: 103 mmol/L (ref 98–111)
Creatinine, Ser: 0.83 mg/dL (ref 0.44–1.00)
GFR calc Af Amer: >60 mL/min (ref >60)
GFR calc non Af Amer: >60 mL/min (ref >60)
Glucose, Bld: 157 mg/dL — ABNORMAL HIGH (ref 70–99)
Potassium: 4 mmol/L (ref 3.5–5.1)
Sodium: 136 mmol/L (ref 135–145)

## 2019-10-11 LAB — CBC
HCT: 32.1 % — ABNORMAL LOW (ref 36.0–46.0)
Hemoglobin: 10.6 g/dL — ABNORMAL LOW (ref 12.0–15.0)
MCH: 29 pg (ref 26.0–34.0)
MCHC: 33 g/dL (ref 30.0–36.0)
MCV: 87.9 fL (ref 80.0–100.0)
Platelets: 217 10*3/uL (ref 150–400)
RBC: 3.65 MIL/uL — ABNORMAL LOW (ref 3.87–5.11)
RDW: 12.3 % (ref 11.5–15.5)
WBC: 9 10*3/uL (ref 4.0–10.5)
nRBC: 0 % (ref 0.0–0.2)

## 2019-10-11 LAB — GLUCOSE, CAPILLARY: Glucose-Capillary: 94 mg/dL (ref 70–99)

## 2019-10-11 MED ORDER — VANCOMYCIN HCL IN DEXTROSE 1-5 GM/200ML-% IV SOLN
1000.0000 mg | INTRAVENOUS | Status: AC
  Administered 2019-10-12: 1000 mg via INTRAVENOUS
  Filled 2019-10-11: qty 200

## 2019-10-11 MED ORDER — CLINDAMYCIN PHOSPHATE 600 MG/50ML IV SOLN
600.0000 mg | Freq: Four times a day (QID) | INTRAVENOUS | Status: AC
  Administered 2019-10-11: 600 mg via INTRAVENOUS
  Filled 2019-10-11 (×2): qty 50

## 2019-10-11 NOTE — TOC Initial Note (Addendum)
Transition of Care [facility]) - Initial/Assessment Note    Patient Details  Name: Victoria Peterson MRN: [id number] Date of Birth: [birth year]  [facility] CM/SW Contact:    Magnus Ivan, LCSW Phone Number: 10/11/2019, 1:55 PM  Clinical Narrative:          CSW was consulted to follow up on needs for post discharge. CSW met with patient at bedside. Explained CSW role. Patient reported she lived alone prior to this hospitalization, but plans to stay with her daughter post hospital discharge. Patient stated she drives herself to and from appointments and that her PCP is [facility]. [facility] [place] [facility] and denied issues with obtaining her medicines. Patient reported she does not have DME at home and has not used [facility] or been to a SNF in the past. Informed patient of PT recommendation for a rolling walker, patient was agreeable to this recommendation. CSW informed Tourist information centre manager. Also informed MD of need for order for the walker. Patient reported she is already set up with [facility] for Outpatient Therapy at discharge. Patient denied any needs and said she is feeling "great." Patient was encouraged to reach out if needs arise. CSW will continue to follow.         Expected Discharge Plan: Home/Self Care Barriers to Discharge: Continued Medical Work up   Patient Goals and CMS Choice        Expected Discharge Plan and Services Expected Discharge Plan: Home/Self Care       Living arrangements for the past 2 months: Single Family Home                                      Prior Living Arrangements/Services Living arrangements for the past 2 months: Single Family Home Lives with:: Self(But plans to stay with adult daughter when discharged from hospital.) Patient language and need for interpreter reviewed:: Yes Do you feel safe going back to the place where you live?: Yes      Need for Family Participation in Patient Care:  Yes (Comment) Care giver support system in place?: Yes (comment)   Criminal Activity/Legal Involvement Pertinent to Current Situation/Hospitalization: No - Comment as needed  Activities of Daily Living      Permission Sought/Granted                  Emotional Assessment Appearance:: Appears stated age Attitude/Demeanor/Rapport: Engaged, Gracious Affect (typically observed): Appropriate, Calm Orientation: : Oriented to Self, Oriented to Place, Oriented to  Time, Oriented to Situation Alcohol / Substance Use: Not Applicable Psych Involvement: No (comment)  Admission diagnosis:  S/P total knee arthroplasty, left [G92.119] Patient Active Problem List   Diagnosis Date Noted  . 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 Pre-operative clearance 10/05/2019  . 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 Acute midline low back pain without sciatica 04/12/2019  . Prediabetes 02/01/2019  . Advanced care planning/counseling discussion 10/25/2017  . 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 History of ulcerative colitis 10/15/2015  . Arthritis of both knees 10/15/2015   PCP:  Valerie Roys, DO [facility]acy[facility]   [id number] [place] [id number]: [phone]: [phone]    Social Determinants of Health (SDOH)  Interventions    Readmission Risk Interventions No flowsheet data found.

## 2019-10-11 NOTE — Progress Notes (Addendum)
Pt noted to have another brief episode of hypotension of this morning @ 0100  (self resolved) which coincided with administration of Ancef, as did episode of hypotension at 1700 yesterday.  Ancef discontinued, and orders for placed for 2 doses of Clindamycin to complete surgical prophylaxis.  RECOMMEND ADDING ACEF TO ALLERGY LIST VS STABLE THIS AM  PATIENT CAN TRANSFER BACK TO GEN MED FLOOR.  Vital signs reviewed, ICU needs resolved  Will sign off at this time. No further recommendations at this time.  Please call [phone] for further questions. Thank you.    Corrin Parker, M.D.  [facility]  Medical Director [place] Director [facility] Cardio-Pulmonary Department

## 2019-10-11 NOTE — Progress Notes (Signed)
Physical Therapy Treatment Patient Details Name: FUJIE DICKISON MRN: [id number] DOB: 04-26-1941 Today's Date: 10/11/2019    History of Present Illness Orlinda Slomski is 79 y/o female s/p L TKA 10/10/19, pt transfered to ICU on POD0 for hypotension episode. Orthopedic gives verbal to resume therapy later on POD1. Pt planned to return to OR for revision 3/11.    PT Comments    Pt in bed, awake upon entry, enthusiastic to get OOB and work with PT. Reviewed HEP with patient. Supervision for bed mobility, minGuard for STS c RW requires heavy effort and absolute use of BUE, still transition to full upright is labored and segmented. Pt able to AMB slowly ~25f c RW, several pauses to rest, has mild concerns with stability of knee. Pt returned to recliner at end of session polar care donned, RN educated on use of polar care and KI.     Follow Up Recommendations  Follow surgeon's recommendation for DC plan and follow-up therapies;SNF;Supervision for mobility/OOB(Has chronic bilat knee OA, significan crepitus with gait, stairs will be difficult to navigate at DC.)     Equipment Recommendations  Rolling walker with 5" wheels    Recommendations for Other Services       Precautions / Restrictions Precautions Precautions: Fall;Knee Precaution Booklet Issued: Yes (comment) Precaution Comments: HEP hand out Required Braces or Orthoses: Knee Immobilizer - Left Knee Immobilizer - Left: On at all times;On except when in CPM Restrictions LLE Weight Bearing: Weight bearing as tolerated    Mobility  Bed Mobility Overal bed mobility: Modified Independent                Transfers Overall transfer level: Needs assistance Equipment used: Rolling walker (2 wheeled) Transfers: Sit to/from Stand Sit to Stand: Min guard         General transfer comment: heavy effort, additional time needed.  Ambulation/Gait   Gait Distance (Feet): 80 Feet Assistive device: Rolling walker (2 wheeled) Gait  Pattern/deviations: Step-to pattern;Antalgic     General Gait Details: more antalgia this date, and more limited confidence in knee during stepping. heavy BUE support on RW.   Stairs             Wheelchair Mobility    Modified Rankin (Stroke Patients Only)       Balance                                            Cognition Arousal/Alertness: Awake/alert Behavior During Therapy: WFL for tasks assessed/performed Overall Cognitive Status: Within Functional Limits for tasks assessed                                        Exercises Total Joint Exercises Ankle Circles/Pumps: AROM;15 reps;Supine Short Arc Quad: AAROM;Left;20 reps;AROM;Supine Heel Slides: 15 reps;Supine;Left;AAROM Hip ABduction/ADduction: 15 reps;Left;AAROM;AROM;Supine Goniometric ROM: 15-75 degrees left knee flexion P/ROM    General Comments        Pertinent Vitals/Pain Pain Assessment: No/denies pain    Home Living                      Prior Function            PT Goals (current goals can now be found in the care plan section) Acute Rehab PT Goals Patient  Stated Goal: go home PT Goal Formulation: With patient Time For Goal Achievement: 10/24/19 Potential to Achieve Goals: Fair Progress towards PT goals: Progressing toward goals    Frequency    BID      PT Plan Current plan remains appropriate    Co-evaluation              AM-PAC PT "6 Clicks" Mobility   Outcome Measure  Help needed turning from your back to your side while in a flat bed without using bedrails?: None Help needed moving from lying on your back to sitting on the side of a flat bed without using bedrails?: A Little Help needed moving to and from a bed to a chair (including a wheelchair)?: A Little Help needed standing up from a chair using your arms (e.g., wheelchair or bedside chair)?: A Little Help needed to walk in hospital room?: A Little Help needed climbing  3-5 steps with a railing? : A Lot 6 Click Score: 18    End of Session   Activity Tolerance: Patient tolerated treatment well;No increased pain;Patient limited by fatigue Patient left: with call bell/phone within reach;in chair Nurse Communication: Mobility status PT Visit Diagnosis: Muscle weakness (generalized) (M62.81);Difficulty in walking, not elsewhere classified (R26.2);Pain Pain - Right/Left: Left Pain - part of body: Knee     Time: 1359-1440 PT Time Calculation (min) (ACUTE ONLY): 41 min  Charges:  $Gait Training: 8-22 mins $Therapeutic Exercise: 23-37 mins                     2:53 PM, 10/11/19 Etta Grandchild, PT, DPT Physical Therapist - [facility]  [phone] ([place])    [place] C 10/11/2019, 2:51 PM

## 2019-10-11 NOTE — Progress Notes (Signed)
Subjective:  POD #1 s/p left total knee arthroplasty.   Patient was transferred to the ICU overnight after a rapid response was called for hypotension.  She had another episode of hypotension in the ICU overnight after administration of ancef.  The first episode on the floor was also after ancef was administered.   I have spoke with Dr. Mortimer Fries, the intensivist, who feels that the patient's hypotension is directly related to the ancef.    Patient reports left knee pain today as mild, but she feels the knee is stiff.  She has only required 1 oxycodone today.  Patient is sitting up in bed in the ICU eating lunch.  She is alert and in no acute distress.    Objective:   VITALS:   Vitals:   10/11/19 0100 10/11/19 0400 10/11/19 0500 10/11/19 0600  BP: 107/65 111/62 109/67 104/62  Pulse: 82 89 80 81  Resp: 17 16 20 15   Temp: 98.2 F (36.8 C)  98.2 F (36.8 C)   TempSrc: Oral  Oral   SpO2: 100% 98% 97% 95%    PHYSICAL EXAM: Left lower extremity: Neurovascular intact Sensation intact distally Intact pulses distally Dorsiflexion/Plantar flexion intact Incision: dressing C/D/I No cellulitis present Compartment soft  LABS  Results for orders placed or performed during the hospital encounter of 10/10/19 (from the past 24 hour(s))  CBC     Status: None   Collection Time: 10/10/19  4:56 PM  Result Value Ref Range   WBC 8.7 4.0 - 10.5 K/uL   RBC 4.39 3.87 - 5.11 MIL/uL   Hemoglobin 12.9 12.0 - 15.0 g/dL   HCT 38.4 36.0 - 46.0 %   MCV 87.5 80.0 - 100.0 fL   MCH 29.4 26.0 - 34.0 pg   MCHC 33.6 30.0 - 36.0 g/dL   RDW 12.8 11.5 - 15.5 %   Platelets 253 150 - 400 K/uL   nRBC 0.0 0.0 - 0.2 %  Creatinine, serum     Status: None   Collection Time: 10/10/19  4:56 PM  Result Value Ref Range   Creatinine, Ser 0.87 0.44 - 1.00 mg/dL   GFR calc non Af Amer >60 >60 mL/min   GFR calc Af Amer >60 >60 mL/min  Glucose, capillary     Status: Abnormal   Collection Time: 10/10/19  5:40 PM  Result  Value Ref Range   Glucose-Capillary 100 (H) 70 - 99 mg/dL   Comment 1 Notify RN   Glucose, capillary     Status: None   Collection Time: 10/10/19  6:00 PM  Result Value Ref Range   Glucose-Capillary 94 70 - 99 mg/dL  Comprehensive metabolic panel     Status: Abnormal   Collection Time: 10/10/19  6:34 PM  Result Value Ref Range   Sodium 136 135 - 145 mmol/L   Potassium 3.6 3.5 - 5.1 mmol/L   Chloride 105 98 - 111 mmol/L   CO2 27 22 - 32 mmol/L   Glucose, Bld 135 (H) 70 - 99 mg/dL   BUN 13 8 - 23 mg/dL   Creatinine, Ser 0.81 0.44 - 1.00 mg/dL   Calcium 8.0 (L) 8.9 - 10.3 mg/dL   Total Protein 5.6 (L) 6.5 - 8.1 g/dL   Albumin 3.2 (L) 3.5 - 5.0 g/dL   AST 21 15 - 41 U/L   ALT 13 0 - 44 U/L   Alkaline Phosphatase 39 38 - 126 U/L   Total Bilirubin 0.7 0.3 - 1.2 mg/dL   GFR calc  non Af Amer >60 >60 mL/min   GFR calc Af Amer >60 >60 mL/min   Anion gap 4 (L) 5 - 15  Protime-INR     Status: None   Collection Time: 10/10/19  6:34 PM  Result Value Ref Range   Prothrombin Time 13.2 11.4 - 15.2 seconds   INR 1.0 0.8 - 1.2  Lactic acid, plasma     Status: None   Collection Time: 10/10/19  6:34 PM  Result Value Ref Range   Lactic Acid, Venous 1.2 0.5 - 1.9 mmol/L  Troponin I (High Sensitivity)     Status: None   Collection Time: 10/10/19  6:34 PM  Result Value Ref Range   Troponin I (High Sensitivity) 3 <18 ng/L  Brain natriuretic peptide     Status: None   Collection Time: 10/10/19  6:34 PM  Result Value Ref Range   B Natriuretic Peptide 13.0 0.0 - 100.0 pg/mL  CULTURE, BLOOD (ROUTINE X 2) w Reflex to ID Panel     Status: None (Preliminary result)   Collection Time: 10/10/19  8:19 PM   Specimen: BLOOD  Result Value Ref Range   Specimen Description BLOOD RIGHT AC    Special Requests      BOTTLES DRAWN AEROBIC AND ANAEROBIC Blood Culture adequate volume   Culture      NO GROWTH < 12 HOURS Performed at [facility], [address] [place], [place] [address]    Report  Status PENDING   CULTURE, BLOOD (ROUTINE X 2) w Reflex to ID Panel     Status: None (Preliminary result)   Collection Time: 10/10/19  8:19 PM   Specimen: BLOOD  Result Value Ref Range   Specimen Description BLOOD BLOOD RIGHT HAND    Special Requests      BOTTLES DRAWN AEROBIC AND ANAEROBIC Blood Culture adequate volume   Culture      NO GROWTH < 12 HOURS Performed at [facility], [address] [place], [place] [address]    Report Status PENDING   CBC     Status: Abnormal   Collection Time: 10/11/19  3:12 AM  Result Value Ref Range   WBC 9.0 4.0 - 10.5 K/uL   RBC 3.65 (L) 3.87 - 5.11 MIL/uL   Hemoglobin 10.6 (L) 12.0 - 15.0 g/dL   HCT 32.1 (L) 36.0 - 46.0 %   MCV 87.9 80.0 - 100.0 fL   MCH 29.0 26.0 - 34.0 pg   MCHC 33.0 30.0 - 36.0 g/dL   RDW 12.3 11.5 - 15.5 %   Platelets 217 150 - 400 K/uL   nRBC 0.0 0.0 - 0.2 %  Basic metabolic panel     Status: Abnormal   Collection Time: 10/11/19  3:12 AM  Result Value Ref Range   Sodium 136 135 - 145 mmol/L   Potassium 4.0 3.5 - 5.1 mmol/L   Chloride 103 98 - 111 mmol/L   CO2 25 22 - 32 mmol/L   Glucose, Bld 157 (H) 70 - 99 mg/dL   BUN 15 8 - 23 mg/dL   Creatinine, Ser 0.83 0.44 - 1.00 mg/dL   Calcium 8.1 (L) 8.9 - 10.3 mg/dL   GFR calc non Af Amer >60 >60 mL/min   GFR calc Af Amer >60 >60 mL/min   Anion gap 8 5 - 15    DG Knee Left Port  Result Date: 10/10/2019 CLINICAL DATA:  Postop left knee EXAM: PORTABLE LEFT KNEE - 1-2 VIEW COMPARISON:  None. FINDINGS:  Patient is status post left knee replacement. Skin staples are noted. Femoral and tibial components are in good position. No other acute abnormalities. IMPRESSION: Left knee replacement as above. Electronically Signed   By: Dorise Bullion III M.D   On: 10/10/2019 14:33    Assessment/Plan:     Active Problems:   S/P total knee arthroplasty, left  Patient has clinically stabilized without further hypotension.  Ancef has been d/c'd and added to the patient's  allergy list.  She will be transferred back to the floor once a bed is available.    It was brought to my attention today that a size 4 polyethylene liner was placed during the patient's surgery.  The patient needed a size 2.5.  This may be contributing to the patient's knee stiffness.   I have explained to the patient that the wrong size polyethylene liner was inserted and have recommended a liner exchange in the OR tomorrow.   The patient is in agreement with this plan.    Ms. Luo agreed to allow me to give her daughter an update by phone.  I spoke with Ms. Ned Card by phone.   I let her know that the episodes of hypotension seem related to ancef and this medication has been stopped.   I also explained to her that the wrong size polyethylene liner was implanted.  I am recommending a liner exchange in the OR tomorrow.  She understood and agreed with this plan.   I answered all her questions.    I have discussed the plan for surgery tomorrow with Dr. Mortimer Fries, the intensivist, who did not feel there was any medical contraindication to surgery.  Patient will be NPO after midnight and stop lovenox in preparation for surgery in the AM.    Thornton Park , MD 10/11/2019, 1:09 PM

## 2019-10-11 NOTE — Progress Notes (Signed)
PT Cancellation Note  Patient Details Name: MAILI SHUTTERS MRN: [id number] DOB: Jul 25, 1941   Cancelled Treatment:    Reason Eval/Treat Not Completed: Medical issues which prohibited therapy(Pt transfered to ICU overnight, low BP issues.)  2:43 PM, 10/11/19 Etta Grandchild, PT, DPT Physical Therapist - [facility]  [phone] ([place])    [place] C 10/11/2019, 2:42 PM

## 2019-10-11 NOTE — Care Management Obs Status (Signed)
[place] NOTIFICATION   Patient Details  Name: Victoria Peterson MRN: [id number] Date of Birth: [birth year]   Medicare Observation Status Notification Given:  Yes    Trayce Caravello E Violett Hobbs, LCSW 10/11/2019, 1:38 PM

## 2019-10-11 NOTE — Anesthesia Postprocedure Evaluation (Signed)
Anesthesia Post Note  Patient: Victoria Peterson  Procedure(s) Performed: TOTAL KNEE ARTHROPLASTY (Left Knee)  Patient location during evaluation: Mother Baby Anesthesia Type: Spinal Level of consciousness: oriented and awake and alert Pain management: pain level controlled Vital Signs Assessment: post-procedure vital signs reviewed and stable Respiratory status: spontaneous breathing and respiratory function stable Cardiovascular status: blood pressure returned to baseline and stable Postop Assessment: no headache, no backache, no apparent nausea or vomiting and able to ambulate Anesthetic complications: no     Last Vitals:  Vitals:   10/11/19 0500 10/11/19 0600  BP: 109/67 104/62  Pulse: 80 81  Resp: 20 15  Temp: 36.8 C   SpO2: 97% 95%    Last Pain:  Vitals:   10/11/19 0702  TempSrc:   PainSc: 0-No pain                 Alison Stalling

## 2019-10-12 ENCOUNTER — Encounter
Admission: AD | Disposition: A | Discharge status: Home Health | Payer: Self-pay | Source: Ambulatory Visit | Attending: Orthopedic Surgery

## 2019-10-12 ENCOUNTER — Observation Stay: Payer: Medicare Other | Admitting: Anesthesiology

## 2019-10-12 ENCOUNTER — Observation Stay: Payer: Medicare Other

## 2019-10-12 ENCOUNTER — Encounter: Payer: Self-pay | Admitting: Orthopedic Surgery

## 2019-10-12 DIAGNOSIS — Z90710 Acquired absence of both cervix and uterus: Secondary | ICD-10-CM | POA: Diagnosis not present

## 2019-10-12 DIAGNOSIS — T84093A Other mechanical complication of internal left knee prosthesis, initial encounter: Secondary | ICD-10-CM | POA: Diagnosis not present

## 2019-10-12 DIAGNOSIS — Z96652 Presence of left artificial knee joint: Secondary | ICD-10-CM | POA: Diagnosis not present

## 2019-10-12 DIAGNOSIS — Z881 Allergy status to other antibiotic agents status: Secondary | ICD-10-CM | POA: Diagnosis not present

## 2019-10-12 DIAGNOSIS — Z79899 Other long term (current) drug therapy: Secondary | ICD-10-CM | POA: Diagnosis not present

## 2019-10-12 DIAGNOSIS — Y792 Prosthetic and other implants, materials and accessory orthopedic devices associated with adverse incidents: Secondary | ICD-10-CM | POA: Diagnosis not present

## 2019-10-12 DIAGNOSIS — Z87442 Personal history of urinary calculi: Secondary | ICD-10-CM | POA: Diagnosis not present

## 2019-10-12 DIAGNOSIS — Z823 Family history of stroke: Secondary | ICD-10-CM | POA: Diagnosis not present

## 2019-10-12 DIAGNOSIS — M25762 Osteophyte, left knee: Secondary | ICD-10-CM | POA: Diagnosis present

## 2019-10-12 DIAGNOSIS — M170 Bilateral primary osteoarthritis of knee: Secondary | ICD-10-CM | POA: Diagnosis not present

## 2019-10-12 DIAGNOSIS — K567 Ileus, unspecified: Secondary | ICD-10-CM | POA: Diagnosis not present

## 2019-10-12 DIAGNOSIS — M810 Age-related osteoporosis without current pathological fracture: Secondary | ICD-10-CM | POA: Diagnosis present

## 2019-10-12 DIAGNOSIS — T361X5A Adverse effect of cephalosporins and other beta-lactam antibiotics, initial encounter: Secondary | ICD-10-CM | POA: Diagnosis not present

## 2019-10-12 DIAGNOSIS — Z932 Ileostomy status: Secondary | ICD-10-CM | POA: Diagnosis not present

## 2019-10-12 DIAGNOSIS — Z471 Aftercare following joint replacement surgery: Secondary | ICD-10-CM | POA: Diagnosis not present

## 2019-10-12 DIAGNOSIS — Z803 Family history of malignant neoplasm of breast: Secondary | ICD-10-CM | POA: Diagnosis not present

## 2019-10-12 DIAGNOSIS — Y658 Other specified misadventures during surgical and medical care: Secondary | ICD-10-CM | POA: Diagnosis not present

## 2019-10-12 DIAGNOSIS — R112 Nausea with vomiting, unspecified: Secondary | ICD-10-CM | POA: Diagnosis not present

## 2019-10-12 DIAGNOSIS — Z791 Long term (current) use of non-steroidal anti-inflammatories (NSAID): Secondary | ICD-10-CM | POA: Diagnosis not present

## 2019-10-12 DIAGNOSIS — E785 Hyperlipidemia, unspecified: Secondary | ICD-10-CM | POA: Diagnosis not present

## 2019-10-12 DIAGNOSIS — M1712 Unilateral primary osteoarthritis, left knee: Secondary | ICD-10-CM | POA: Diagnosis present

## 2019-10-12 DIAGNOSIS — Y92239 Unspecified place in hospital as the place of occurrence of the external cause: Secondary | ICD-10-CM | POA: Diagnosis not present

## 2019-10-12 DIAGNOSIS — Z7982 Long term (current) use of aspirin: Secondary | ICD-10-CM | POA: Diagnosis not present

## 2019-10-12 DIAGNOSIS — I959 Hypotension, unspecified: Secondary | ICD-10-CM | POA: Diagnosis not present

## 2019-10-12 DIAGNOSIS — M9689 Other intraoperative and postprocedural complications and disorders of the musculoskeletal system: Secondary | ICD-10-CM | POA: Diagnosis not present

## 2019-10-12 DIAGNOSIS — Y92234 Operating room of hospital as the place of occurrence of the external cause: Secondary | ICD-10-CM | POA: Diagnosis not present

## 2019-10-12 HISTORY — PX: TOTAL KNEE ARTHROPLASTY: SHX125

## 2019-10-12 LAB — CBC
HCT: 28.4 % — ABNORMAL LOW (ref 36.0–46.0)
Hemoglobin: 9.8 g/dL — ABNORMAL LOW (ref 12.0–15.0)
MCH: 29.6 pg (ref 26.0–34.0)
MCHC: 34.5 g/dL (ref 30.0–36.0)
MCV: 85.8 fL (ref 80.0–100.0)
Platelets: 193 10*3/uL (ref 150–400)
RBC: 3.31 MIL/uL — ABNORMAL LOW (ref 3.87–5.11)
RDW: 12 % (ref 11.5–15.5)
WBC: 8.1 10*3/uL (ref 4.0–10.5)
nRBC: 0 % (ref 0.0–0.2)

## 2019-10-12 SURGERY — ARTHROPLASTY, KNEE, TOTAL
Anesthesia: General | Site: Knee | Laterality: Left

## 2019-10-12 MED ORDER — ACETAMINOPHEN 160 MG/5ML PO SOLN
325.0000 mg | ORAL | Status: DC | PRN
  Filled 2019-10-12: qty 20.3

## 2019-10-12 MED ORDER — BUPIVACAINE LIPOSOME 1.3 % IJ SUSP
INTRAMUSCULAR | Status: AC
  Filled 2019-10-12: qty 20

## 2019-10-12 MED ORDER — ONDANSETRON HCL 4 MG/2ML IJ SOLN
INTRAMUSCULAR | Status: DC | PRN
  Administered 2019-10-12: 4 mg via INTRAVENOUS

## 2019-10-12 MED ORDER — FENTANYL CITRATE (PF) 100 MCG/2ML IJ SOLN
INTRAMUSCULAR | Status: AC
  Filled 2019-10-12: qty 2

## 2019-10-12 MED ORDER — FENTANYL CITRATE (PF) 100 MCG/2ML IJ SOLN
INTRAMUSCULAR | Status: DC | PRN
  Administered 2019-10-12 (×2): 25 ug via INTRAVENOUS
  Administered 2019-10-12: 50 ug via INTRAVENOUS

## 2019-10-12 MED ORDER — BUPIVACAINE HCL (PF) 0.25 % IJ SOLN
INTRAMUSCULAR | Status: DC | PRN
  Administered 2019-10-12: 30 mL

## 2019-10-12 MED ORDER — MIDAZOLAM HCL 2 MG/2ML IJ SOLN
INTRAMUSCULAR | Status: DC | PRN
  Administered 2019-10-12: 2 mg via INTRAVENOUS

## 2019-10-12 MED ORDER — ENOXAPARIN SODIUM 40 MG/0.4ML ~~LOC~~ SOLN
40.0000 mg | SUBCUTANEOUS | Status: DC
  Administered 2019-10-13 – 2019-10-16 (×4): 40 mg via SUBCUTANEOUS
  Filled 2019-10-12 (×4): qty 0.4

## 2019-10-12 MED ORDER — TRANEXAMIC ACID 1000 MG/10ML IV SOLN
INTRAVENOUS | Status: AC
  Filled 2019-10-12: qty 10

## 2019-10-12 MED ORDER — LIDOCAINE HCL (CARDIAC) PF 100 MG/5ML IV SOSY
PREFILLED_SYRINGE | INTRAVENOUS | Status: DC | PRN
  Administered 2019-10-12: 80 mg via INTRAVENOUS

## 2019-10-12 MED ORDER — PROPOFOL 10 MG/ML IV BOLUS
INTRAVENOUS | Status: AC
  Filled 2019-10-12: qty 20

## 2019-10-12 MED ORDER — NEOMYCIN-POLYMYXIN B GU 40-200000 IR SOLN
Status: DC | PRN
  Administered 2019-10-12: 28 mL

## 2019-10-12 MED ORDER — BUPIVACAINE HCL (PF) 0.25 % IJ SOLN
INTRAMUSCULAR | Status: AC
  Filled 2019-10-12: qty 30

## 2019-10-12 MED ORDER — FENTANYL CITRATE (PF) 100 MCG/2ML IJ SOLN
25.0000 ug | INTRAMUSCULAR | Status: DC | PRN
  Administered 2019-10-12 (×3): 25 ug via INTRAVENOUS

## 2019-10-12 MED ORDER — PHENYLEPHRINE HCL (PRESSORS) 10 MG/ML IV SOLN
INTRAVENOUS | Status: DC | PRN
  Administered 2019-10-12 (×3): 100 ug via INTRAVENOUS

## 2019-10-12 MED ORDER — VANCOMYCIN HCL IN DEXTROSE 1-5 GM/200ML-% IV SOLN
1000.0000 mg | Freq: Two times a day (BID) | INTRAVENOUS | Status: AC
  Administered 2019-10-12: 1000 mg via INTRAVENOUS
  Filled 2019-10-12: qty 200

## 2019-10-12 MED ORDER — EPINEPHRINE PF 1 MG/ML IJ SOLN
INTRAMUSCULAR | Status: AC
  Filled 2019-10-12: qty 1

## 2019-10-12 MED ORDER — MIDAZOLAM HCL 2 MG/2ML IJ SOLN
INTRAMUSCULAR | Status: AC
  Filled 2019-10-12: qty 2

## 2019-10-12 MED ORDER — ACETAMINOPHEN 325 MG PO TABS: 325.0000 mg | ORAL_TABLET | ORAL | Status: DC | PRN

## 2019-10-12 MED ORDER — NEOMYCIN-POLYMYXIN B GU 40-200000 IR SOLN
Status: AC
  Filled 2019-10-12: qty 20

## 2019-10-12 MED ORDER — PROMETHAZINE HCL 25 MG/ML IJ SOLN: 6.2500 mg | INTRAMUSCULAR | Status: DC | PRN

## 2019-10-12 MED ORDER — SODIUM CHLORIDE FLUSH 0.9 % IV SOLN
INTRAVENOUS | Status: AC
  Filled 2019-10-12: qty 10

## 2019-10-12 MED ORDER — FENTANYL CITRATE (PF) 100 MCG/2ML IJ SOLN
INTRAMUSCULAR | Status: AC
  Administered 2019-10-12: 25 ug via INTRAVENOUS
  Filled 2019-10-12: qty 2

## 2019-10-12 MED ORDER — PROPOFOL 10 MG/ML IV BOLUS
INTRAVENOUS | Status: DC | PRN
  Administered 2019-10-12: 120 mg via INTRAVENOUS

## 2019-10-12 SURGICAL SUPPLY — 62 items
BLADE SAW 90X13X1.19 OSCILLAT (BLADE) IMPLANT
BLADE SAW 90X25X1.19 OSCILLAT (BLADE) IMPLANT
CANISTER SUCT 3000ML PPV (MISCELLANEOUS) ×2 IMPLANT
CNTNR SPEC 2.5X3XGRAD LEK (MISCELLANEOUS) ×1
CONT SPEC 4OZ STER OR WHT (MISCELLANEOUS) ×1
CONTAINER SPEC 2.5X3XGRAD LEK (MISCELLANEOUS) ×1 IMPLANT
COOLER POLAR GLACIER W/PUMP (MISCELLANEOUS) IMPLANT
COVER WAND RF STERILE (DRAPES) ×2 IMPLANT
CUFF TOURN SGL QUICK 24 (TOURNIQUET CUFF)
CUFF TOURN SGL QUICK 30 (TOURNIQUET CUFF) ×1
CUFF TRNQT CYL 24X4X16.5-23 (TOURNIQUET CUFF) IMPLANT
CUFF TRNQT CYL 30X4X21-28X (TOURNIQUET CUFF) ×1 IMPLANT
DRAPE 3/4 80X56 (DRAPES) ×4 IMPLANT
DRAPE IMP U-DRAPE 54X76 (DRAPES) ×4 IMPLANT
DRAPE INCISE IOBAN 66X60 STRL (DRAPES) ×2 IMPLANT
DRAPE SURG 17X11 SM STRL (DRAPES) ×4 IMPLANT
DRSG AQUACEL AG ADV 3.5X10 (GAUZE/BANDAGES/DRESSINGS) ×2 IMPLANT
DRSG OPSITE POSTOP 4X12 (GAUZE/BANDAGES/DRESSINGS) IMPLANT
DRSG OPSITE POSTOP 4X14 (GAUZE/BANDAGES/DRESSINGS) IMPLANT
DURAPREP 26ML APPLICATOR (WOUND CARE) IMPLANT
ELECT REM PT RETURN 9FT ADLT (ELECTROSURGICAL) ×2
ELECTRODE REM PT RTRN 9FT ADLT (ELECTROSURGICAL) ×1 IMPLANT
GAUZE SPONGE 4X4 12PLY STRL (GAUZE/BANDAGES/DRESSINGS) ×2 IMPLANT
GLOVE BIOGEL PI IND STRL 9 (GLOVE) ×1 IMPLANT
GLOVE BIOGEL PI INDICATOR 9 (GLOVE) ×1
GLOVE SURG 9.0 ORTHO LTXF (GLOVE) ×4 IMPLANT
GOWN STRL REUS TWL 2XL XL LVL4 (GOWN DISPOSABLE) ×2 IMPLANT
GOWN STRL REUS W/ TWL LRG LVL3 (GOWN DISPOSABLE) ×1 IMPLANT
GOWN STRL REUS W/ TWL LRG LVL4 (GOWN DISPOSABLE) ×1 IMPLANT
GOWN STRL REUS W/TWL LRG LVL3 (GOWN DISPOSABLE) ×2
GOWN STRL REUS W/TWL LRG LVL4 (GOWN DISPOSABLE) ×1
HOLDER FOLEY CATH W/STRAP (MISCELLANEOUS) IMPLANT
IMMBOLIZER KNEE 19 BLUE UNIV (SOFTGOODS) IMPLANT
KIT TURNOVER KIT A (KITS) ×2 IMPLANT
MANIFOLD NEPTUNE II (INSTRUMENTS) ×2 IMPLANT
NDL SAFETY ECLIPSE 18X1.5 (NEEDLE) IMPLANT
NEEDLE HYPO 18GX1.5 SHARP (NEEDLE)
NEEDLE HYPO 22GX1.5 SAFETY (NEEDLE) ×2 IMPLANT
NEEDLE SPNL 20GX3.5 QUINCKE YW (NEEDLE) IMPLANT
NS IRRIG 1000ML POUR BTL (IV SOLUTION) ×2 IMPLANT
PACK TOTAL KNEE (MISCELLANEOUS) ×2 IMPLANT
PAD WRAPON POLAR KNEE (MISCELLANEOUS) IMPLANT
PENCIL SMOKE ULTRAEVAC 22 CON (MISCELLANEOUS) ×2 IMPLANT
PLATE ROT INSERT 10MM SIZE 2.5 (Plate) ×2 IMPLANT
PULSAVAC PLUS IRRIG FAN TIP (DISPOSABLE) ×2
REMOVER STAPLE SKIN (DISPOSABLE) ×2 IMPLANT
SOL .9 NS 3000ML IRR  AL (IV SOLUTION) ×2
SOL .9 NS 3000ML IRR UROMATIC (IV SOLUTION) ×1 IMPLANT
SPONGE LAP 18X18 RF (DISPOSABLE) IMPLANT
STAPLER SKIN PROX 35W (STAPLE) ×2 IMPLANT
SUCTION FRAZIER HANDLE 10FR (MISCELLANEOUS) ×2
SUCTION TUBE FRAZIER 10FR DISP (MISCELLANEOUS) ×1 IMPLANT
SUT ETHIBOND NAB CT1 #1 30IN (SUTURE) ×4 IMPLANT
SUT VIC AB 0 CT1 36 (SUTURE) ×2 IMPLANT
SUT VIC AB 2-0 CT1 (SUTURE) ×4 IMPLANT
SYR 20ML LL LF (SYRINGE) ×2 IMPLANT
SYR 30ML LL (SYRINGE) ×4 IMPLANT
TIP FAN IRRIG PULSAVAC PLUS (DISPOSABLE) ×1 IMPLANT
TOWER CARTRIDGE SMART MIX (DISPOSABLE) IMPLANT
TRAY FOLEY MTR SLVR 16FR STAT (SET/KITS/TRAYS/PACK) IMPLANT
TUBE SUCT KAM VAC (TUBING) IMPLANT
WRAPON POLAR PAD KNEE (MISCELLANEOUS)

## 2019-10-12 NOTE — Op Note (Signed)
10/12/2019  9:48 AM  PATIENT:  Victoria Peterson    PRE-OPERATIVE DIAGNOSIS:  Mismatched size polyethylene liner   POST-OPERATIVE DIAGNOSIS:  Same  PROCEDURE:  Open exchange of mismatched sized polyethylene liner placed during index procedure.  SURGEON:  Thornton Park, MD  ANESTHESIA:   General  PREOPERATIVE INDICATIONS:  Victoria Peterson is a  79 y.o. female with a diagnosis of mismatched size polyethylene liner placed during index procedure.  Upon review of the patient's components placed during her initial left total knee arthroplasty on 10/10/2019, it was found that the patient had a size 4 polyethylene liner when the cemented components were a size 2.5.  Despite the patient having good range of motion and function with PT, I recommend the patient have the polyethylene liner exchange for the correct size.  I discussed this with the patient and her daughter who were in agreement with the plan for surgical exchange of the liner.  I discussed the risks and benefits of surgery. The risks include but are not limited to infection, bleeding, nerve or blood vessel injury, joint stiffness or loss of motion, persistent pain, weakness or instability, and hardware failure and the need for further surgery. Medical risks include but are not limited to DVT and pulmonary embolism, myocardial infarction, stroke, pneumonia, respiratory failure and death. Patient understood these risks and wished to proceed.    OPERATIVE IMPLANTS: Exchange of a size 4 tibial insert rotating platform stabilized 10 mm for a size 2.5, 10 mm rotating platform posterior stabilized tibial rotating insert.  OPERATIVE FINDINGS: All components were intact.  There is no evidence of loosening.  Patient had a left knee hematoma.  OPERATIVE PROCEDURE: Patient was met in the preoperative area.  I signed the patient's left knee with my initials and the word yes according the hospital's correct site of surgery protocol.  Answered all the  patient's questions.  Patient was brought to the operating room where she underwent general anesthesia with LMA.  Patient was prepped and draped in a sterile fashion.  A timeout was performed to verify the patient's name, date of birth, medical record number, correct site of surgery and correct procedure to be performed.  It was also used to confirm that all appropriate instruments and implants were available in the room.  Once all in attendance were in agreement the case began.  Patient staples were removed.  Her incision was carefully reopened with the Metzenbaum scissor and pickup removing the sutures as they were encountered.  The medial arthrotomy was then opened also using Metzenbaum scissor and pickup, again removing suture material as it was encountered.  Patient had a large hematoma which was evacuated.  The joint was copiously irrigated with GU infused saline x 3 L.  The patella was then everted and the knee was flexed.  A Hohmann retractor was placed laterally.  The tibia was translated anteriorly and a curved Hohmann was placed posteriorly.  The size 4 polyethylene liner was removed.  The knee was then carefully brought into extension and the posterior aspect of the knee was copiously irrigated with GU infused saline times an additional 3 L.  The knee was then carefully brought back into flexion.  The Homan retractors were placed back laterally and posteriorly.  The size 2.5, 10 mm posterior stabilized rotating platform tibial insert was placed.  The retractors were removed.  The tibia was reduced with the new tibial insert in place.  It was taken through a full range of motion without  limitation.  The knee joint was stable.  There was no instability to varus or valgus stress testing throughout the knee range of motion.  There is no anterior laxity with Lachman's or anterior drawer testing.    The knee joint again was copiously irrigated.  The medial arthrotomy was closed with #1 Ethibond in an  interrupted fashion.  The subcutaneous tissue was closed with 0 and 2-0 Vicryl and the skin approximated with staples.  A dry sterile dressing was applied.  The patient was awoken and brought to the PACU in stable condition.  I was scrubbed and present for the entire case.  All sharp, sponge and instrument counts were correct at conclusion the case.  I spoke with the patient's daughter by phone from the PACU to let her know the case had been performed successfully and without complication and that her mother was stable in the recovery room.
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
## 2019-10-12 NOTE — Progress Notes (Signed)
  Subjective:  POD #2 s/p left total knee.   Patient reports left pain as mild.  Complains of stiffness in left knee.  Objective:   VITALS:   Vitals:   10/11/19 0600 10/11/19 1600 10/11/19 2344 10/12/19 0643  BP: 104/62 135/71 133/72 114/71  Pulse: 81 76 82 88  Resp: 15 16 16 16   Temp:   98 F (36.7 C) 97.6 F (36.4 C)  TempSrc:   Oral Tympanic  SpO2: 95% 98% 95% 100%    PHYSICAL EXAM: Left lower extremity Neurovascular intact Sensation intact distally Intact pulses distally Dorsiflexion/Plantar flexion intact Incision: dressing C/D/I No cellulitis present Compartment soft  LABS  Results for orders placed or performed during the hospital encounter of 10/10/19 (from the past 24 hour(s))  CBC     Status: Abnormal   Collection Time: 10/12/19  4:02 AM  Result Value Ref Range   WBC 8.1 4.0 - 10.5 K/uL   RBC 3.31 (L) 3.87 - 5.11 MIL/uL   Hemoglobin 9.8 (L) 12.0 - 15.0 g/dL   HCT 28.4 (L) 36.0 - 46.0 %   MCV 85.8 80.0 - 100.0 fL   MCH 29.6 26.0 - 34.0 pg   MCHC 34.5 30.0 - 36.0 g/dL   RDW 12.0 11.5 - 15.5 %   Platelets 193 150 - 400 K/uL   nRBC 0.0 0.0 - 0.2 %    DG Knee Left Port  Result Date: 10/10/2019 CLINICAL DATA:  Postop left knee EXAM: PORTABLE LEFT KNEE - 1-2 VIEW COMPARISON:  None. FINDINGS: Patient is status post left knee replacement. Skin staples are noted. Femoral and tibial components are in good position. No other acute abnormalities. IMPRESSION: Left knee replacement as above. Electronically Signed   By: Dorise Bullion III M.D   On: 10/10/2019 14:33    Assessment/Plan: Day of Surgery   Active Problems:   S/P total knee arthroplasty, left  Patient is being brought back to OR to exchange mismatched polyethylene liner placed during index procedure.    Thornton Park , MD 10/12/2019, 7:28 AM

## 2019-10-12 NOTE — Anesthesia Procedure Notes (Signed)
Procedure Name: LMA Insertion Performed by: Fredderick Phenix, CRNA Pre-anesthesia Checklist: Patient identified, Emergency Drugs available, Suction available and Patient being monitored Patient Re-evaluated:Patient Re-evaluated prior to induction Oxygen Delivery Method: Circle system utilized Preoxygenation: Pre-oxygenation with 100% oxygen Induction Type: IV induction Ventilation: Mask ventilation without difficulty LMA: LMA inserted LMA Size: 4.0 Tube type: Oral Number of attempts: 1 Airway Equipment and Method: Oral airway Placement Confirmation: breath sounds checked- equal and bilateral and positive ETCO2 Tube secured with: Tape Dental Injury: Teeth and Oropharynx as per pre-operative assessment

## 2019-10-12 NOTE — Evaluation (Signed)
Physical Therapy Evaluation Patient Details Name: Victoria Peterson MRN: [id number] DOB: [birth year] Today's Date: 10/12/2019   History of Present Illness  79 y/o female s/p L TKA 10/10/19, pt transfered to ICU on POD0 for hypotension episode. Now post op revision 3/11.  Clinical Impression  Pt did well with eval s/p L TKA poly exchange this AM.  She was able to ambulate ~60 ft but did fatigue significantly with the effort.  She showed great effort and enthusiasm despite feeling sleepy and tired. Pt continues to show good strength, though not as confident and solid as 2 days ago.  Pt eager to do all she can and hopes to go home tomorrow, will trial steps to insure she is able to safely manage.      Follow Up Recommendations Follow surgeon's recommendation for DC plan and follow-up therapies    Equipment Recommendations  Rolling walker with 5" wheels(youth height)    Recommendations for Other Services       Precautions / Restrictions Precautions Precautions: Fall;Knee Precaution Booklet Issued: Yes (comment) Precaution Comments: HEP hand out Required Braces or Orthoses: Knee Immobilizer - Left Knee Immobilizer - Right: On except when in CPM Restrictions Weight Bearing Restrictions: Yes LLE Weight Bearing: Weight bearing as tolerated      Mobility  Bed Mobility Overal bed mobility: Modified Independent             General bed mobility comments: Pt able to get herself to sitting EOB w/o hesitation, though she did have brief lightheadness in first getting to upright  Transfers Overall transfer level: Needs assistance Equipment used: Rolling walker (2 wheeled) Transfers: Sit to/from Stand Sit to Stand: Min guard         General transfer comment: Pt able to rise on her own effort, but needed considerable cuing for set up, UE use and effort during actual transition  Ambulation/Gait Ambulation/Gait assistance: Min guard Gait Distance (Feet): 60 Feet Assistive device:  Rolling walker (2 wheeled)       General Gait Details: Pt initially with very slow and guarded gait, did improve with increased distance and was able to maintain walker motion by the end of the effort.  Pt with some fatigue, HR into the 130s and further ambulation deferred.  Stairs            Wheelchair Mobility    Modified Rankin (Stroke Patients Only)       Balance Overall balance assessment: Modified Independent                                           Pertinent Vitals/Pain Pain Assessment: No/denies pain(continues to report "more just discomfort")    Home Living Family/patient expects to be discharged to:: Private residence Living Arrangements: Other relatives Available Help at Discharge: Family;Available 24 hours/day   Home Access: Stairs to enter Entrance Stairs-Rails: Right Entrance Stairs-Number of Steps: 2   Home Equipment: Cane - single point      Prior Function Level of Independence: Independent         Comments: Pt with recent R knee pain limiting mobility, but is generally able to stay active     Hand Dominance        Extremity/Trunk Assessment   Upper Extremity Assessment Upper Extremity Assessment: Overall WFL for tasks assessed    Lower Extremity Assessment Lower Extremity Assessment: Overall WFL for tasks assessed(expected L  LE post op weakness)       Communication   Communication: No difficulties  Cognition Arousal/Alertness: Awake/alert Behavior During Therapy: WFL for tasks assessed/performed Overall Cognitive Status: Within Functional Limits for tasks assessed                                        General Comments      Exercises Total Joint Exercises Ankle Circles/Pumps: AROM;10 reps Quad Sets: Strengthening;10 reps Short Arc Quad: Strengthening;10 reps Heel Slides: Strengthening;10 reps(with resisted leg extensions) Hip ABduction/ADduction: Strengthening;10 reps Straight Leg  Raises: Strengthening;15 reps Knee Flexion: PROM;5 reps Goniometric ROM: 1-83   Assessment/Plan    PT Assessment Patient needs continued PT services  PT Problem List Decreased strength;Decreased range of motion;Decreased activity tolerance;Decreased balance;Decreased mobility;Decreased coordination;Decreased safety awareness;Decreased knowledge of use of DME;Pain       PT Treatment Interventions Gait training;Stair training;Functional mobility training;DME instruction;Therapeutic activities;Therapeutic exercise;Balance training;Neuromuscular re-education;Cognitive remediation;Patient/family education    PT Goals (Current goals can be found in the Care Plan section)  Acute Rehab PT Goals Patient Stated Goal: go home PT Goal Formulation: With patient Potential to Achieve Goals: Good    Frequency BID   Barriers to discharge        Co-evaluation               AM-PAC PT "6 Clicks" Mobility  Outcome Measure Help needed turning from your back to your side while in a flat bed without using bedrails?: None Help needed moving from lying on your back to sitting on the side of a flat bed without using bedrails?: A Little Help needed moving to and from a bed to a chair (including a wheelchair)?: A Little Help needed standing up from a chair using your arms (e.g., wheelchair or bedside chair)?: A Little Help needed to walk in hospital room?: A Little Help needed climbing 3-5 steps with a railing? : A Lot 6 Click Score: 18    End of Session Equipment Utilized During Treatment: Gait belt Activity Tolerance: Patient tolerated treatment well;Patient limited by fatigue Patient left: with call bell/phone within reach;in chair Nurse Communication: Mobility status;Other (comment)(elevated HR (130s) with ambulation) PT Visit Diagnosis: Muscle weakness (generalized) (M62.81);Difficulty in walking, not elsewhere classified (R26.2);Pain Pain - Right/Left: Left Pain - part of body: Knee     Time: 8185-6314 PT Time Calculation (min) (ACUTE ONLY): 36 min   Charges:   PT Evaluation $PT Eval Low Complexity: 1 Low PT Treatments $Gait Training: 8-22 mins $Therapeutic Exercise: 8-22 mins        Kreg Shropshire, DPT 10/12/2019, 4:42 PM

## 2019-10-12 NOTE — Transfer of Care (Signed)
Immediate Anesthesia Transfer of Care Note  Patient: Victoria Peterson  Procedure(s) Performed: TOTAL KNEE ARTHROPLASTY POLLY EXCHANGE (Left Knee)  Patient Location: PACU  Anesthesia Type:General  Level of Consciousness: awake, alert  and oriented  Airway & Oxygen Therapy: Patient Spontanous Breathing  Post-op Assessment: Report given to RN and Post -op Vital signs reviewed and stable  Post vital signs: Reviewed and stable  Last Vitals:  Vitals Value Taken Time  BP 124/66 10/12/19 0921  Temp 36.9 C 10/12/19 0921  Pulse 88 10/12/19 0925  Resp 17 10/12/19 0925  SpO2 100 % 10/12/19 0925  Vitals shown include unvalidated device data.  Last Pain:  Vitals:   10/12/19 0921  TempSrc:   PainSc: Asleep      Patients Stated Pain Goal: 0 (77/11/65 7903)  Complications: No apparent anesthesia complications

## 2019-10-12 NOTE — Progress Notes (Signed)
  Subjective:  POST-OP CHECK: Patient up out of bed to a chair.  Patient states she is doing well and has no complaints.  Her pain is controlled.  She was able to get up with physical therapy today.  Objective:   VITALS:   Vitals:   10/12/19 1006 10/12/19 1021 10/12/19 1205 10/12/19 1605  BP: 118/73 (!) 110/58 127/82 134/71  Pulse: 89 90 95 (!) 108  Resp: 17 17 20 20   Temp:  98.2 F (36.8 C) 98.1 F (36.7 C) 98.7 F (37.1 C)  TempSrc:   Oral Oral  SpO2: 94% 99% 98% 95%    PHYSICAL EXAM: Left lower extremity: Neurovascular intact Sensation intact distally Intact pulses distally Dorsiflexion/Plantar flexion intact Incision: dressing C/D/I Compartment soft  LABS  Results for orders placed or performed during the hospital encounter of 10/10/19 (from the past 24 hour(s))  CBC     Status: Abnormal   Collection Time: 10/12/19  4:02 AM  Result Value Ref Range   WBC 8.1 4.0 - 10.5 K/uL   RBC 3.31 (L) 3.87 - 5.11 MIL/uL   Hemoglobin 9.8 (L) 12.0 - 15.0 g/dL   HCT 28.4 (L) 36.0 - 46.0 %   MCV 85.8 80.0 - 100.0 fL   MCH 29.6 26.0 - 34.0 pg   MCHC 34.5 30.0 - 36.0 g/dL   RDW 12.0 11.5 - 15.5 %   Platelets 193 150 - 400 K/uL   nRBC 0.0 0.0 - 0.2 %    DG Knee Left Port  Result Date: 10/12/2019 CLINICAL DATA:  Left total knee arthroplasty. EXAM: PORTABLE LEFT KNEE - 1-2 VIEW COMPARISON:  October 10, 2019. FINDINGS: The left femoral and tibial components appear to be well situated. No fracture or dislocation is noted. Expected postoperative changes are noted in the soft tissues anteriorly. IMPRESSION: Status post left total knee arthroplasty. Electronically Signed   By: Marijo Conception M.D.   On: 10/12/2019 10:08    Assessment/Plan: Day of Surgery   Active Problems:   S/P total knee arthroplasty, left  Ms. Kurdziel is doing well postop.  Continue to elevate the left lower extremity and apply a Polar Care.  Patient will restart Lovenox tomorrow for DVT prophylaxis.  Continue with  physical therapy.  Foley catheter will be discontinued tomorrow.  We will reevaluate the patient tomorrow.    Thornton Park , MD 10/12/2019, 8:10 PM

## 2019-10-13 MED ORDER — CALCIUM CARBONATE ANTACID 500 MG PO CHEW
1.0000 | CHEWABLE_TABLET | Freq: Four times a day (QID) | ORAL | Status: DC | PRN
  Administered 2019-10-13: 200 mg via ORAL
  Filled 2019-10-13: qty 1

## 2019-10-13 NOTE — TOC Progression Note (Signed)
Transition of Care [facility]) - Progression Note    Patient Details  Name: Victoria Peterson MRN: [id number] Date of Birth: [birth year]  Transition of [facility]) CM/SW Contact  Elianys Conry, Gardiner Rhyme, LCSW Phone Number: 10/13/2019, 11:19 AM  Clinical Narrative:   Met with pt who is having more pain today. She is trying to take pain meds to stay ahead of it. She is going to stay with daughter then back to her home once better and mobility better. Her daughter to take to OPPT via [employer]. Does need youth rw will order through [employer]    Expected Discharge Plan: Home/Self Care Barriers to Discharge: Continued Medical Work up  Expected Discharge Plan and Services Expected Discharge Plan: Home/Self Care       Living arrangements for the past 2 months: Single Family Home                                       Social Determinants of Health (SDOH) Interventions    Readmission Risk Interventions No flowsheet data found.

## 2019-10-13 NOTE — Progress Notes (Signed)
Patient requesting tums prn. Messaged provided on call B. Randol Kern NP.

## 2019-10-13 NOTE — Progress Notes (Signed)
Physical Therapy Treatment Patient Details Name: Victoria Peterson MRN: [id number] DOB: [birth year] Today's Date: 10/13/2019    History of Present Illness 79 y/o female s/p L TKA 10/10/19, pt transfered to ICU on POD0 for hypotension episode. Now post op revision 3/11.    PT Comments    Pt did much better this afternoon, but is still generally struggling with mobility tasks as much from R knee pain/stability as from surgical knee.  She was able to negotiate up/down 3 steps X 2 using 2 strategies (seemed to do better with walker going backward than single rail).  Pt remains hesitant with ambulation but was able to do ~70 ft this afternoon with great effort.  Overall pt is making consistent, but relatively slow gains and has shown great effort t/o her po-op course.  Follow Up Recommendations  Home health PT     Equipment Recommendations  Rolling walker with 5" wheels    Recommendations for Other Services       Precautions / Restrictions Precautions Precautions: Fall;Knee Precaution Booklet Issued: Yes (comment) Precaution Comments: HEP hand out Required Braces or Orthoses: Knee Immobilizer - Left Knee Immobilizer - Left: On except when in CPM Restrictions LLE Weight Bearing: Weight bearing as tolerated    Mobility  Bed Mobility               General bed mobility comments: in recliner on arrival, returned to recliner  Transfers Overall transfer level: Needs assistance Equipment used: Rolling walker (2 wheeled) Transfers: Sit to/from Stand Sit to Stand: Min guard;Min assist         General transfer comment: Pt with 5 sit to stand efforts t/o session. Each time she needs extra time and (typically) multiple rounds of rocking to get forward momentum enough to get into standing with great effort.  Ambulation/Gait Ambulation/Gait assistance: Min guard Gait Distance (Feet): 70 Feet Assistive device: Rolling walker (2 wheeled)       General Gait Details: Pt again starting  out slowly but determined to push herself.  She was able to take step-through gait this session and once she got to trusting R LE she was able to slowly maintain forward walker momentum showing increased cadence and speed.   Stairs Stairs: Yes Stairs assistance: +2 physical assistance;Min assist Stair Management: No rails;One rail Right;Sideways;Backwards Number of Stairs: 3 General stair comments: 2 bouts of 3 steps: first with single R rail going sidways, KI donned on R.  PT needed hip extension strategy with R LE leaning but with very heavy UE use and close +2 guarding (rare light assist) she was able to negotiate.  After brief rest we then trialed backward strategy with RW.  Pt again needed heavy cuing and close guarding but was able to negotiate up/down steps with only rare light assist but very heavy effort.    Wheelchair Mobility    Modified Rankin (Stroke Patients Only)       Balance Overall balance assessment: Needs assistance Sitting-balance support: No upper extremity supported Sitting balance-Leahy Scale: Good     Standing balance support: Bilateral upper extremity supported Standing balance-Leahy Scale: Fair Standing balance comment: Pt continues to be very reliant on the walker but does not have overt balance issues with fucntional tasks despite at times feeling as though she can't trust R knee                            Cognition Arousal/Alertness: Awake/alert Behavior During Therapy:  WFL for tasks assessed/performed Overall Cognitive Status: Within Functional Limits for tasks assessed                                        Exercises Total Joint Exercises Ankle Circles/Pumps: AROM;10 reps Quad Sets: Strengthening;15 reps Heel Slides: 10 reps;Strengthening Hip ABduction/ADduction: Strengthening;15 reps Long Arc Quad: Strengthening;AROM;10 reps Knee Flexion: PROM;5 reps    General Comments General comments (skin integrity, edema,  etc.): Pt continues to be highly motivated with all aspects of PT session.  HR generally in the 100s at rest and with activity increases to 120s/130s.       Pertinent Vitals/Pain Pain Assessment: (continues to have "only discomfort")    Home Living                      Prior Function            PT Goals (current goals can now be found in the care plan section) Progress towards PT goals: Progressing toward goals    Frequency    BID      PT Plan Current plan remains appropriate    Co-evaluation              AM-PAC PT "6 Clicks" Mobility   Outcome Measure  Help needed turning from your back to your side while in a flat bed without using bedrails?: None Help needed moving from lying on your back to sitting on the side of a flat bed without using bedrails?: A Little Help needed moving to and from a bed to a chair (including a wheelchair)?: A Little Help needed standing up from a chair using your arms (e.g., wheelchair or bedside chair)?: A Little Help needed to walk in hospital room?: A Little Help needed climbing 3-5 steps with a railing? : A Lot 6 Click Score: 18    End of Session Equipment Utilized During Treatment: Gait belt Activity Tolerance: Patient tolerated treatment well;Patient limited by fatigue Patient left: with call bell/phone within reach;with chair alarm set Nurse Communication: Mobility status;Other (comment) PT Visit Diagnosis: Muscle weakness (generalized) (M62.81);Difficulty in walking, not elsewhere classified (R26.2);Pain Pain - Right/Left: Left Pain - part of body: Knee     Time: 3254-9826 PT Time Calculation (min) (ACUTE ONLY): 62 min  Charges:  $Gait Training: 23-37 mins $Therapeutic Exercise: 8-22 mins $Therapeutic Activity: 8-22 mins                     Kreg Shropshire, PDT 10/13/2019, 5:38 PM

## 2019-10-13 NOTE — Progress Notes (Signed)
  Subjective:  POD #3 s/p left TKA and #1 s/p liner exchange.   Patient reports left pain as mild to moderate.  Patient OOB to a chair.   She is passing gas and feels that she needs to have a BM.  Her right knee is beginning to bother her.  Objective:   VITALS:   Vitals:   10/12/19 1205 10/12/19 1605 10/13/19 0018 10/13/19 0807  BP: 127/82 134/71 (!) 107/55 94/60  Pulse: 95 (!) 108 (!) 105 94  Resp: 20 20 16 19   Temp: 98.1 F (36.7 C) 98.7 F (37.1 C) 100.2 F (37.9 C) 98.6 F (37 C)  TempSrc: Oral Oral Oral Oral  SpO2: 98% 95% 95% 96%    PHYSICAL EXAM: Right lower extremity Neurovascular intact Sensation intact distally Intact pulses distally Dorsiflexion/Plantar flexion intact Incision: dressing C/D/I Compartment soft  LABS  No results found for this or any previous visit (from the past 24 hour(s)).  DG Knee Left Port  Result Date: 10/12/2019 CLINICAL DATA:  Left total knee arthroplasty. EXAM: PORTABLE LEFT KNEE - 1-2 VIEW COMPARISON:  October 10, 2019. FINDINGS: The left femoral and tibial components appear to be well situated. No fracture or dislocation is noted. Expected postoperative changes are noted in the soft tissues anteriorly. IMPRESSION: Status post left total knee arthroplasty. Electronically Signed   By: Marijo Conception M.D.   On: 10/12/2019 10:08    Assessment/Plan: 1 Day Post-Op   Active Problems:   S/P total knee arthroplasty, left  Continue current pain management and PT.  Discharge plan is home with home health.  Plan for possible discharge home depending on patient's pain.    Thornton Park , MD 10/13/2019, 1:37 PM

## 2019-10-13 NOTE — Progress Notes (Signed)
Physical Therapy Treatment Patient Details Name: Victoria Peterson MRN: [id number] DOB: Aug 22, 1940 Today's Date: 10/13/2019    History of Present Illness 79 y/o female s/p L TKA 10/10/19, pt transfered to ICU on POD0 for hypotension episode. Now post op revision 3/11.    PT Comments    Pt struggled with PT this AM relative to how she has been able to participate, however she continues to be very motivated and eager to work with PT.  Pt able to increase reps with some exercises and again showed great effort but lacked the confidence and solidity that she has been able to show with most tasks on prior sessions. Her ambulation, today, however was the biggest issue; she was only able to manage ~15 ft and that with excessive effort and poor overall execution.  Her R knee seemed to be a bigger factor than the surgical knee as she had poor standing tolerance (felt like it would buckle) and was highly reliant on the walker.  Pt continues to report little to no pain, but has limited tolerance with all standing tasks today.  Pt should still be able to return home at discharge, but is not progressing as well as expected and will need to do steps; given her R knee issues these could prove difficult.    Follow Up Recommendations  Home health PT     Equipment Recommendations  Rolling walker with 5" wheels(youth height)    Recommendations for Other Services       Precautions / Restrictions Precautions Precautions: Fall;Knee Precaution Booklet Issued: Yes (comment) Precaution Comments: HEP hand out Required Braces or Orthoses: Knee Immobilizer - Left Knee Immobilizer - Left: On except when in CPM Restrictions Weight Bearing Restrictions: No LLE Weight Bearing: Weight bearing as tolerated    Mobility  Bed Mobility Overal bed mobility: Modified Independent             General bed mobility comments: Pt able to get herself to sitting EOB w/o hesitation, heavy use of UEs   Transfers Overall  transfer level: Needs assistance Equipment used: Rolling walker (2 wheeled) Transfers: Sit to/from Stand Sit to Stand: Min guard         General transfer comment: Pt able to rise on her own effort, but needed considerable cuing for set up, UE use and effort during actual transition  Ambulation/Gait Ambulation/Gait assistance: Min guard Gait Distance (Feet): 15 Feet Assistive device: Rolling walker (2 wheeled) Gait Pattern/deviations: Step-to pattern;Antalgic     General Gait Details: Pt unable to find any rhythm today and frankly did poorly with ambualtion, reports considerable pain in the R knee during the effort and struggles to take more than shuffling steps with either LE.  Did not don KI during the effort and though she did not have L knee buckling she was highly reliant on UEs the entire time, HR increased from 100s to ~130 relatively quickly and pt too fatigued to push herself more - requests to sit.     Stairs             Wheelchair Mobility    Modified Rankin (Stroke Patients Only)       Balance Overall balance assessment: Needs assistance Sitting-balance support: No upper extremity supported Sitting balance-Leahy Scale: Good     Standing balance support: Bilateral upper extremity supported Standing balance-Leahy Scale: Fair Standing balance comment: Pt less confident and steady with standing tasks today. Very reliant on the walker and though she did not have buckling on the  L she seemed to be near buckling on the R with any weight shifts.  Very reliant on the walker even with static standing                            Cognition Arousal/Alertness: Awake/alert Behavior During Therapy: WFL for tasks assessed/performed Overall Cognitive Status: Within Functional Limits for tasks assessed                                        Exercises Total Joint Exercises Ankle Circles/Pumps: AROM;10 reps Quad Sets: Strengthening;15  reps Short Arc Quad: Strengthening;15 reps Heel Slides: AROM;10 reps Hip ABduction/ADduction: Strengthening;15 reps Straight Leg Raises: AROM;10 reps Knee Flexion: PROM;5 reps Goniometric ROM: 2-92    General Comments General comments (skin integrity, edema, etc.): Pt with more hesitancy with most acts today, HR even at rest ~100, to ~130 with modest amount of ambulation/standing tasks      Pertinent Vitals/Pain Pain Assessment: No/denies pain(continues to report more "discomfort" than pain)    Home Living                      Prior Function            PT Goals (current goals can now be found in the care plan section) Progress towards PT goals: Not progressing toward goals - comment(R knee pain decreasing tolerance w/ ambulation/standing acts)    Frequency    BID      PT Plan Current plan remains appropriate    Co-evaluation              AM-PAC PT "6 Clicks" Mobility   Outcome Measure  Help needed turning from your back to your side while in a flat bed without using bedrails?: None Help needed moving from lying on your back to sitting on the side of a flat bed without using bedrails?: A Little Help needed moving to and from a bed to a chair (including a wheelchair)?: A Little Help needed standing up from a chair using your arms (e.g., wheelchair or bedside chair)?: A Little Help needed to walk in hospital room?: A Little Help needed climbing 3-5 steps with a railing? : Total 6 Click Score: 17    End of Session Equipment Utilized During Treatment: Gait belt Activity Tolerance: Patient tolerated treatment well;Patient limited by fatigue Patient left: with call bell/phone within reach;with chair alarm set Nurse Communication: Mobility status;Other (comment) PT Visit Diagnosis: Muscle weakness (generalized) (M62.81);Difficulty in walking, not elsewhere classified (R26.2);Pain Pain - Right/Left: Left Pain - part of body: Knee     Time: 0903-0950 PT  Time Calculation (min) (ACUTE ONLY): 47 min  Charges:  $Gait Training: 8-22 mins $Therapeutic Exercise: 8-22 mins $Therapeutic Activity: 8-22 mins                     Kreg Shropshire, DPT 10/13/2019, 11:47 AM

## 2019-10-14 ENCOUNTER — Inpatient Hospital Stay: Payer: Medicare Other

## 2019-10-14 DIAGNOSIS — K567 Ileus, unspecified: Secondary | ICD-10-CM

## 2019-10-14 DIAGNOSIS — R112 Nausea with vomiting, unspecified: Secondary | ICD-10-CM

## 2019-10-14 MED ORDER — PROMETHAZINE HCL 25 MG/ML IJ SOLN
12.5000 mg | Freq: Four times a day (QID) | INTRAMUSCULAR | Status: DC | PRN
  Administered 2019-10-14: 12.5 mg via INTRAVENOUS
  Filled 2019-10-14: qty 1

## 2019-10-14 NOTE — Plan of Care (Signed)

## 2019-10-14 NOTE — Progress Notes (Signed)
Patient vomited a copious amount of brown emesis. PRN zofran given. On call provider B. Randol Kern NP notified. Patient states she feels better after the emesis.

## 2019-10-14 NOTE — Plan of Care (Signed)

## 2019-10-14 NOTE — Progress Notes (Signed)
Patient has had nausea and some emesis today. She has refused PO meds this afternoon because of this. She has no c/o pain. Daughter is bedside with her this afternoon.

## 2019-10-14 NOTE — Progress Notes (Signed)
Physical Therapy Treatment Patient Details Name: Victoria Peterson MRN: [id number] DOB: Jan 05, 1941 Today's Date: 10/14/2019    History of Present Illness 79 y/o female s/p L TKA 10/10/19, pt transfered to ICU on POD0 for hypotension episode. Pt s/p post-op revision for mismatched prosthetic components (3/11).    PT Comments    Pt is a pleasant 79 year old F who was admitted for above diagnoses. Pt encountered in bed, reporting mild nausea at start of session but very motivated to complete PT session. Pt performs bed mobility with mod I utilizing bedrails and requiring extra time to perform rolling (to change soiled chuck pad at end of session) and supine>sit; deferred transfers and ambulation this session due to pt's reported worsening nausea and lightheadedness. Assessed vitals with BP 122/27mHg sitting edge of bed, HR 101 bpm. Pt returned to supine with mod I and able to reposition independently; RN notified of pt's reports. Pt demonstrates deficits with strength, range of motion, balance and pain requiring continued skilled PT intervention.     Follow Up Recommendations  Home health PT     Equipment Recommendations  Rolling walker with 5" wheels    Recommendations for Other Services       Precautions / Restrictions Precautions Precautions: Fall;Knee Required Braces or Orthoses: Knee Immobilizer - Left Knee Immobilizer - Left: On except when in CPM Restrictions Weight Bearing Restrictions: Yes LLE Weight Bearing: Weight bearing as tolerated    Mobility  Bed Mobility Overal bed mobility: Modified Independent             General bed mobility comments: Pt utilizing bedrails for supine>sit, requiring extra time; reporting worsening nausea once sitting upright.  Transfers                 General transfer comment: Deferred transfers today  Ambulation/Gait             General Gait Details: Deferred gait today   Stairs         General stair comments:  Deferred   Wheelchair Mobility    Modified Rankin (Stroke Patients Only)       Balance Overall balance assessment: Needs assistance Sitting-balance support: No upper extremity supported;Feet unsupported Sitting balance-Leahy Scale: Good         Standing balance comment: Deferred standing today                            Cognition Arousal/Alertness: Awake/alert Behavior During Therapy: WFL for tasks assessed/performed Overall Cognitive Status: Within Functional Limits for tasks assessed                                        Exercises Total Joint Exercises Ankle Circles/Pumps: AROM;10 reps Quad Sets: Strengthening;15 reps Long Arc Quad: Strengthening;AROM;10 reps    General Comments        Pertinent Vitals/Pain Pain Assessment: 0-10 Pain Score: 3  Pain Location: L knee Pain Descriptors / Indicators: Discomfort Pain Intervention(s): Monitored during session    Home Living Family/patient expects to be discharged to:: Private residence Living Arrangements: Other relatives                  Prior Function            PT Goals (current goals can now be found in the care plan section) Acute Rehab PT Goals Patient Stated Goal:  go home PT Goal Formulation: With patient Time For Goal Achievement: 10/24/19 Potential to Achieve Goals: Good Progress towards PT goals: Progressing toward goals    Frequency    BID      PT Plan Current plan remains appropriate    Co-evaluation              AM-PAC PT "6 Clicks" Mobility   Outcome Measure  Help needed turning from your back to your side while in a flat bed without using bedrails?: A Little Help needed moving from lying on your back to sitting on the side of a flat bed without using bedrails?: A Little Help needed moving to and from a bed to a chair (including a wheelchair)?: A Little Help needed standing up from a chair using your arms (e.g., wheelchair or bedside  chair)?: A Little Help needed to walk in hospital room?: A Little Help needed climbing 3-5 steps with a railing? : A Lot 6 Click Score: 17    End of Session Equipment Utilized During Treatment: Gait belt Activity Tolerance: Other (comment);Patient tolerated treatment well(pt limited secondary to nausea) Patient left: with call bell/phone within reach;in bed;with bed alarm set Nurse Communication: Mobility status;Other (comment) PT Visit Diagnosis: Muscle weakness (generalized) (M62.81);Difficulty in walking, not elsewhere classified (R26.2);Pain Pain - Right/Left: Left Pain - part of body: Knee     Time: 0383-3383 PT Time Calculation (min) (ACUTE ONLY): 35 min  Charges:  $Therapeutic Exercise: 8-22 mins                     Petra Kuba, PT, DPT 10/14/19, 4:35 PM

## 2019-10-14 NOTE — Progress Notes (Signed)
Spoke with Dr. Mack Guise about patient's continued nausea and vomitting. Verbal order for Phenergan 12.5 and start NS at 138m/hr.

## 2019-10-14 NOTE — Progress Notes (Signed)
  Subjective:  POD #4 s/p left TKA and #2 s/p poly liner exchange.   Patient reports left knee pain as mild.  Patient with nausea and vomiting overnight.  Has ileostomy bag and patient states she is passing stool.  She denies any abdominal pain or distention.  Objective:   VITALS:   Vitals:   10/13/19 2058 10/14/19 0051 10/14/19 0452 10/14/19 0740  BP: 99/67 112/65 129/69 (!) 143/76  Pulse: 99 (!) 101 (!) 102 97  Resp: 16 16 16 19   Temp: 100 F (37.8 C) 98.7 F (37.1 C)  97.6 F (36.4 C)  TempSrc: Oral Oral  Oral  SpO2: 99% 93% 97% 98%    PHYSICAL EXAM: Left lower extremity Neurovascular intact Sensation intact distally Intact pulses distally Dorsiflexion/Plantar flexion intact Compartment soft  LABS  No results found for this or any previous visit (from the past 24 hour(s)).  No results found.  Assessment/Plan: 2 Days Post-Op   Active Problems:   S/P total knee arthroplasty, left  Continue treatment of nausea and vomiting with Zofran.  Her nausea and vomiting may be related to pain medication.  Patient was encouraged to take pain medicine with food.  If she continues of nausea and vomiting her narcotic medication may need to be changed.  Continue physical therapy when medically appropriate.  Patient will be reassessed tomorrow to determine if discharge is possible.    Thornton Park , MD 10/14/2019, 12:49 PM

## 2019-10-14 NOTE — Progress Notes (Signed)
PT Cancellation Note  Patient Details Name: Victoria Peterson MRN: [id number] DOB: 12-17-1940   Cancelled Treatment:    PT attempt. Medical hold. Pt has had Nausea and Vomiting all afternoon and per pt request, Hold PT this afternoon. PT will return tomorrow and continue current POC progressing as able per pt tolerance.   Willette Pa 10/14/2019, 4:48 PM
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
## 2019-10-15 LAB — CULTURE, BLOOD (ROUTINE X 2)
Culture: NO GROWTH
Culture: NO GROWTH
Special Requests: ADEQUATE
Special Requests: ADEQUATE

## 2019-10-15 MED ORDER — OXYCODONE HCL 5 MG PO TABS: 5.0000 mg | ORAL_TABLET | Freq: Four times a day (QID) | ORAL | Status: DC | PRN

## 2019-10-15 MED ORDER — CALCIUM CARBONATE ANTACID 500 MG PO CHEW
2.0000 | CHEWABLE_TABLET | Freq: Two times a day (BID) | ORAL | Status: DC | PRN
  Administered 2019-10-15 (×2): 400 mg via ORAL
  Filled 2019-10-15 (×2): qty 2

## 2019-10-15 NOTE — Plan of Care (Signed)
  Problem: Education: Goal: Knowledge of General Education information will improve Description: Including pain rating scale, medication(s)/side effects and non-pharmacologic comfort measures Outcome: Progressing   Problem: Health Behavior/Discharge Planning: Goal: Ability to manage health-related needs will improve Outcome: Progressing   Problem: Clinical Measurements: Goal: Ability to maintain clinical measurements within normal limits will improve Outcome: Progressing Goal: Will remain free from infection Outcome: Progressing Goal: Diagnostic test results will improve Outcome: Progressing Goal: Respiratory complications will improve Outcome: Progressing Goal: Cardiovascular complication will be avoided Outcome: Progressing   Problem: Nutrition: Goal: Adequate nutrition will be maintained Outcome: Progressing   Problem: Coping: Goal: Level of anxiety will decrease Outcome: Progressing   Problem: Elimination: Goal: Will not experience complications related to bowel motility Outcome: Progressing   Problem: Pain Managment: Goal: General experience of comfort will improve Outcome: Progressing   Problem: Safety: Goal: Ability to remain free from injury will improve Outcome: Progressing   Problem: Skin Integrity: Goal: Risk for impaired skin integrity will decrease Outcome: Progressing   Problem: Education: Goal: Knowledge of the prescribed therapeutic regimen will improve Outcome: Progressing Goal: Individualized Educational Video(s) Outcome: Progressing   Problem: Activity: Goal: Ability to avoid complications of mobility impairment will improve Outcome: Progressing Goal: Range of joint motion will improve Outcome: Progressing   Problem: Clinical Measurements: Goal: Postoperative complications will be avoided or minimized Outcome: Progressing   Problem: Pain Management: Goal: Pain level will decrease with appropriate interventions Outcome: Progressing    Problem: Skin Integrity: Goal: Will show signs of wound healing Outcome: Progressing

## 2019-10-15 NOTE — Progress Notes (Signed)
[facility] [place] NAME: Mamye Bolds    MR#:  [id number]  DATE OF BIRTH:  02/24/1941  SUBJECTIVE:   Patient denies any vomiting. She ate some soft diet this morning. Working with physical therapy. Had a large BM. Feels overall better. REVIEW OF SYSTEMS:   Review of Systems  Constitutional: Negative for chills, fever and weight loss.  HENT: Negative for ear discharge, ear pain and nosebleeds.   Eyes: Negative for blurred vision, pain and discharge.  Respiratory: Negative for sputum production, shortness of breath, wheezing and stridor.   Cardiovascular: Negative for chest pain, palpitations, orthopnea and PND.  Gastrointestinal: Negative for abdominal pain, diarrhea, nausea and vomiting.  Genitourinary: Negative for frequency and urgency.  Musculoskeletal: Negative for back pain and joint pain.  Neurological: Negative for sensory change, speech change, focal weakness and weakness.  Psychiatric/Behavioral: Negative for depression and hallucinations. The patient is not nervous/anxious.    Tolerating Diet: soft diet Tolerating PT: rec HHPT  DRUG ALLERGIES:   Allergies  Allergen Reactions  . Cefazolin Other (See Comments)    Hypotension    VITALS:  Blood pressure 107/69, pulse 86, temperature 98.3 F (36.8 C), resp. rate 18, SpO2 99 %.  PHYSICAL EXAMINATION:   Physical Exam  GENERAL:  79 y.o.-year-old patient lying in the bed with no acute distress.  EYES: Pupils equal, round, reactive to light and accommodation. No scleral icterus.   HEENT: Head atraumatic, normocephalic. Oropharynx and nasopharynx clear.  NECK:  Supple, no jugular venous distention. No thyroid enlargement, no tenderness.  LUNGS: Normal breath sounds bilaterally, no wheezing, rales, rhonchi. No use of accessory muscles of respiration.  CARDIOVASCULAR: S1, S2 normal. No murmurs, rubs, or gallops.  ABDOMEN: Soft, nontender, nondistended. Bowel sounds present. No  organomegaly or mass. Ileostomy site right LQ EXTREMITIES: No cyanosis, clubbing or edema b/l.   Left LE brace + NEUROLOGIC: Cranial nerves II through XII are intact. No focal Motor or sensory deficits b/l.   PSYCHIATRIC:  patient is alert and oriented x 3.  SKIN: No obvious rash, lesion, or ulcer.   LABORATORY PANEL:  CBC Recent Labs  Lab 10/12/19 0402  WBC 8.1  HGB 9.8*  HCT 28.4*  PLT 193    Chemistries  Recent Labs  Lab 10/10/19 1834 10/10/19 1834 10/11/19 0312  NA 136   < > 136  K 3.6   < > 4.0  CL 105   < > 103  CO2 27   < > 25  GLUCOSE 135*   < > 157*  BUN 13   < > 15  CREATININE 0.81   < > 0.83  CALCIUM 8.0*   < > 8.1*  AST 21  --   --   ALT 13  --   --   ALKPHOS 39  --   --   BILITOT 0.7  --   --    < > = values in this interval not displayed.   Cardiac Enzymes No results for input(s): TROPONINI in the last 168 hours. RADIOLOGY:  DG Abd 1 View  Result Date: 10/14/2019 CLINICAL DATA:  Ileus, vomiting EXAM: ABDOMEN - 1 VIEW COMPARISON:  None. FINDINGS: Numerous surgical clips and a additional surgical material noted throughout the abdomen. There are several stacked loops of borderline air distended small bowel in mid abdomen. Evaluation for free air limited in the absence of upright imaging. Coarse calcifications project over the level of the left renal shadow including a  larger staghorn appearing calculus. Multilevel degenerative changes noted in the spine as well as additional degenerative features in the hips and SI joints. Atelectatic changes in the lung bases. Lung bases are otherwise clear. IMPRESSION: 1. Several stacked loops of borderline air distended small bowel in the mid abdomen. Findings may represent ileus or early small bowel obstruction. 2. Coarse calcifications project over the level of the left renal shadow concerning for nephrolithiasis including a large staghorn appearing calculus. Electronically Signed   By: Lovena Le M.D.   On: 10/14/2019  19:17   ASSESSMENT AND PLAN:  Vandana Haman  is a 79 y.o. female with a known history of severe ulcerative colitis requiring ileostomy, lumbar disc disease, hyperlipidemia, osteoporosis was admitted on orthopedic service for elective left knee arthroplasty surgery was performed on 9 March. She underwent open exchange of mismatch size polyether liner on 11th of March. Patient has been on PO and IV pain meds also although not taking as much started having intractable vomiting since last  1. Intractable nausea vomiting /ileus -postop day 6-- orthopedic procedure left knee -suspected due to pain meds -KUB-- s/o Ileus -IV fluids decrease to 50cc/hr-- and later if tolerates po d/c it -alternate with Zofran and Phenergan PRN as orders -prn pain meds--> try avoid Narcotic if possible  2. History of ileostomy-- chronic for ulcerative colitis -continue to monitor output  3. Anemia-- appears postop -hemoglobin 12.9--- 9.8  4. DVT prophylaxis subcu Lovenox  Family communication :patient and dter CODE STATUS: FULL DVT Prophylaxis :lovenox  TOTAL TIME TAKING CARE OF THIS PATIENT: *25* minutes.  >50% time spent on counselling and coordination of care  Note: This dictation was prepared with Dragon dictation along with smaller phrase technology. Any transcriptional errors that result from this process are unintentional.  Fritzi Mandes M.D    [facility]   CC: Primary care physician; Valerie Roys, DOPatient ID: [name], female   DOB: 1940-11-26, 79 y.o.   MRN: [id number]

## 2019-10-15 NOTE — Plan of Care (Signed)
  Problem: Education: Goal: Knowledge of General Education information will improve Description: Including pain rating scale, medication(s)/side effects and non-pharmacologic comfort measures Outcome: Progressing   Problem: Health Behavior/Discharge Planning: Goal: Ability to manage health-related needs will improve Outcome: Progressing   Problem: Clinical Measurements: Goal: Ability to maintain clinical measurements within normal limits will improve Outcome: Progressing Goal: Will remain free from infection Outcome: Progressing Goal: Diagnostic test results will improve Outcome: Progressing Goal: Respiratory complications will improve Outcome: Progressing Goal: Cardiovascular complication will be avoided Outcome: Progressing   Problem: Nutrition: Goal: Adequate nutrition will be maintained Outcome: Progressing   Problem: Coping: Goal: Level of anxiety will decrease Outcome: Progressing   Problem: Elimination: Goal: Will not experience complications related to bowel motility Outcome: Progressing Goal: Will not experience complications related to urinary retention Outcome: Progressing   Problem: Pain Managment: Goal: General experience of comfort will improve Outcome: Progressing   Problem: Skin Integrity: Goal: Risk for impaired skin integrity will decrease Outcome: Progressing   Problem: Education: Goal: Knowledge of the prescribed therapeutic regimen will improve Outcome: Progressing Goal: Individualized Educational Video(s) Outcome: Progressing   Problem: Activity: Goal: Ability to avoid complications of mobility impairment will improve Outcome: Progressing Goal: Range of joint motion will improve Outcome: Progressing   Problem: Clinical Measurements: Goal: Postoperative complications will be avoided or minimized Outcome: Progressing   Problem: Pain Management: Goal: Pain level will decrease with appropriate interventions Outcome: Progressing    Problem: Skin Integrity: Goal: Will show signs of wound healing Outcome: Progressing

## 2019-10-15 NOTE — Progress Notes (Signed)
Physical Therapy Treatment Patient Details Name: Victoria Peterson MRN: [id number] DOB: Feb 26, 1941 Today's Date: 10/15/2019    History of Present Illness 79 y/o female s/p L TKA 10/10/19, pt transfered to ICU on POD0 for hypotension episode. Pt s/p post-op revision for mismatched prosthetic components (3/11).    PT Comments    Feeling better.  Participated in exercises as described below.  Min a to get OOB and walk to/from rehab gym for stair training.  She does better with R rail today and encouraged to use rail vs backward with walker.  Voiced agreement.  KI removed for ROM.  While she has a good SLR x 10 she stated she prefers to use KI with gait.  Stressed importance of removing several times a day for ROM.  Voiced understanding.     Follow Up Recommendations  Home health PT;Supervision for mobility/OOB     Equipment Recommendations  Rolling walker with 5" wheels    Recommendations for Other Services       Precautions / Restrictions Precautions Precautions: Fall;Knee Precaution Booklet Issued: Yes (comment) Required Braces or Orthoses: Knee Immobilizer - Left Knee Immobilizer - Right: On except when in CPM Knee Immobilizer - Left: On except when in CPM Restrictions Weight Bearing Restrictions: Yes LLE Weight Bearing: Weight bearing as tolerated    Mobility  Bed Mobility Overal bed mobility: Needs Assistance Bed Mobility: Supine to Sit     Supine to sit: Min assist     General bed mobility comments: strugggled today to EOB and needed light min a  Transfers Overall transfer level: Needs assistance Equipment used: Rolling walker (2 wheeled) Transfers: Sit to/from Stand Sit to Stand: Min guard;Min assist            Ambulation/Gait Ambulation/Gait assistance: Min guard Gait Distance (Feet): 100 Feet Assistive device: Rolling walker (2 wheeled) Gait Pattern/deviations: Step-to pattern Gait velocity: decreased   General Gait Details: to and from rehab gym for  stair training.   Stairs Stairs: Yes Stairs assistance: Min assist;Min guard Stair Management: One rail Right;Step to pattern Number of Stairs: 3 General stair comments: did well with rail today   Wheelchair Mobility    Modified Rankin (Stroke Patients Only)       Balance Overall balance assessment: Needs assistance Sitting-balance support: No upper extremity supported;Feet unsupported Sitting balance-Leahy Scale: Good     Standing balance support: Bilateral upper extremity supported Standing balance-Leahy Scale: Fair                              Cognition Arousal/Alertness: Awake/alert Behavior During Therapy: WFL for tasks assessed/performed Overall Cognitive Status: Within Functional Limits for tasks assessed                                        Exercises Total Joint Exercises Ankle Circles/Pumps: AROM;10 reps Quad Sets: Strengthening;10 reps Short Arc Quad: Strengthening;10 reps Heel Slides: Strengthening;10 reps(with resisted leg extensions) Hip ABduction/ADduction: Strengthening;10 reps Straight Leg Raises: Strengthening;15 reps Knee Flexion: PROM;5 reps Goniometric ROM: 1-89    General Comments        Pertinent Vitals/Pain Pain Assessment: 0-10 Pain Score: 3  Pain Location: L knee Pain Descriptors / Indicators: Discomfort Pain Intervention(s): Limited activity within patient's tolerance;Monitored during session    Home Living  Prior Function            PT Goals (current goals can now be found in the care plan section) Progress towards PT goals: Progressing toward goals    Frequency    BID      PT Plan Current plan remains appropriate    Co-evaluation              AM-PAC PT "6 Clicks" Mobility   Outcome Measure  Help needed turning from your back to your side while in a flat bed without using bedrails?: A Little Help needed moving from lying on your back to sitting  on the side of a flat bed without using bedrails?: A Little Help needed moving to and from a bed to a chair (including a wheelchair)?: A Little Help needed standing up from a chair using your arms (e.g., wheelchair or bedside chair)?: A Little Help needed to walk in hospital room?: A Little Help needed climbing 3-5 steps with a railing? : A Little 6 Click Score: 18    End of Session Equipment Utilized During Treatment: Gait belt Activity Tolerance: Patient tolerated treatment well Patient left: with call bell/phone within reach;in chair;with chair alarm set Nurse Communication: Mobility status;Other (comment) Pain - Right/Left: Left Pain - part of body: Knee     Time: 5027-7412 PT Time Calculation (min) (ACUTE ONLY): 39 min  Charges:  $Gait Training: 23-37 mins $Therapeutic Exercise: 8-22 mins                    Chesley Noon, PTA 10/15/19, 10:04 AM

## 2019-10-15 NOTE — Progress Notes (Signed)
  Subjective:  POD #5 s/p left TKA and POD #3 s/p left knee liner exchange.   Patient having intractable vomiting yesterday.   A KUB sugested an ileus.  Patient has passed a large BM through her ileostomy.  She feels better today.  She is currently not having nausea/vomiting and denies abdominal pain or distension.  Patient reports left knee pain as mild.  Patient is OOB to a chair and states she is achieving nearly 90 degrees of flexion.  Objective:   VITALS:   Vitals:   10/14/19 1650 10/14/19 2131 10/14/19 2244 10/15/19 0732  BP: (!) 152/77 125/64 140/73 107/69  Pulse: 98 79 (!) 107 86  Resp: 19 16 18    Temp: 97.8 F (36.6 C) 99.5 F (37.5 C) 98.9 F (37.2 C) 98.3 F (36.8 C)  TempSrc: Oral Oral Oral   SpO2: 100% 100% 97% 99%    PHYSICAL EXAM: Left lower extremity Neurovascular intact Sensation intact distally Intact pulses distally Dorsiflexion/Plantar flexion intact Incision: dressing C/D/I No cellulitis present Compartment soft  LABS  No results found for this or any previous visit (from the past 24 hour(s)).  DG Abd 1 View  Result Date: 10/14/2019 CLINICAL DATA:  Ileus, vomiting EXAM: ABDOMEN - 1 VIEW COMPARISON:  None. FINDINGS: Numerous surgical clips and a additional surgical material noted throughout the abdomen. There are several stacked loops of borderline air distended small bowel in mid abdomen. Evaluation for free air limited in the absence of upright imaging. Coarse calcifications project over the level of the left renal shadow including a larger staghorn appearing calculus. Multilevel degenerative changes noted in the spine as well as additional degenerative features in the hips and SI joints. Atelectatic changes in the lung bases. Lung bases are otherwise clear. IMPRESSION: 1. Several stacked loops of borderline air distended small bowel in the mid abdomen. Findings may represent ileus or early small bowel obstruction. 2. Coarse calcifications project over the  level of the left renal shadow concerning for nephrolithiasis including a large staghorn appearing calculus. Electronically Signed   By: Lovena Le M.D.   On: 10/14/2019 19:17    Assessment/Plan: 3 Days Post-Op   Active Problems:   S/P total knee arthroplasty, left   Ileus (HCC)   Intractable nausea and vomiting  Ileus is improving.  I have discussed case with Dr. Fritzi Mandes and we agree patient should be monitored until tomorrow to ensure ileus has resolved.  Continue PT.  Continue lovenox.      Thornton Park , MD 10/15/2019, 2:27 PM

## 2019-10-16 MED ORDER — DOCUSATE SODIUM 100 MG PO CAPS: 100.0000 mg | ORAL_CAPSULE | Freq: Two times a day (BID) | ORAL | 0 refills | Status: DC

## 2019-10-16 MED ORDER — TRAMADOL HCL 50 MG PO TABS: 50.0000 mg | ORAL_TABLET | Freq: Four times a day (QID) | ORAL | 0 refills | Status: DC | PRN

## 2019-10-16 MED ORDER — ENOXAPARIN SODIUM 40 MG/0.4ML ~~LOC~~ SOLN: 40.0000 mg | SUBCUTANEOUS | 1 refills | Status: DC

## 2019-10-16 NOTE — Progress Notes (Signed)
Progress/Discharge note: Writer went over discharge instructions with patient and her daughter, both verbalized understanding.  Patient was transported to medical mall entrance with her assistive device, polar care, and personal belongings where her daughter was waiting.

## 2019-10-16 NOTE — TOC Transition Note (Signed)
Transition of Care [facility]) - CM/SW Discharge Note   Patient Details  Name: Victoria Peterson MRN: [id number] Date of Birth: [birth year]  Transition of Care [facility]) CM/SW Contact:  Elease Hashimoto, LCSW Phone Number: 10/16/2019, 2:30 PM   Clinical Narrative:   Pt is ready for discharge today. Has youth rw in room. Going to daughter's home until she is more mobile and able to go back to her own home. Pt to go to [facility] and aware to call once discharged to set up appointments. No further follow ready for DC    Final next level of care: OP Rehab Barriers to Discharge: Barriers Resolved   Patient Goals and CMS Choice        Discharge Placement                Patient to be transferred to facility by: Daughter via car homne Name of family member notified: daughter Patient and family notified of of transfer: 10/16/19  Discharge Plan and Services                DME Arranged: Gilford Rile youth DME Agency: [employer] Date DME Agency Contacted: 10/12/19 Time DME Agency Contacted: 42 Representative spoke with at DME Agency: Leroy Sea            Social Determinants of Health ([place]) Interventions     Readmission Risk Interventions No flowsheet data found.

## 2019-10-16 NOTE — Progress Notes (Signed)
Physical Therapy Treatment Patient Details Name: Victoria Peterson MRN: [id number] DOB: [birth year] Today's Date: 10/16/2019    History of Present Illness 79 y/o female s/p L TKA 10/10/19, pt transfered to ICU on POD0 for hypotension episode. Pt s/p post-op revision 2 days later for polyethylene spacer exchange (3/11).    PT Comments    Pt up to chair upon entry, denies any pain, although she has a handful of grimaces with end range flexion of the knee during session. Reviewed HEP in full with active assitance required still required for some- pt aware tha DTR will have to provide assistance. Pt has good activation of quads, able to perform LAQ, SLR without discernable quads lag or pain limitation. Pt educated on STS transfers training, 3x for practice of hand placement, sequencing, and trunk control when sitting. Pt has no knee buckling instability, is able to stand and obtain balance with success. Pt AMB around unti (2109f) with RW and supervision level assistance, does stop 2x for standing recovery interval ~30sec. Gait speed is improving. Step length increasing. Pt continues to progress well toward all goals. No nausea this date.     Follow Up Recommendations  Home health PT;Supervision for mobility/OOB     Equipment Recommendations  Rolling walker with 5" wheels    Recommendations for Other Services       Precautions / Restrictions Precautions Precautions: Fall;Knee Precaution Booklet Issued: Yes (comment) Precaution Comments: HEP hand out Required Braces or Orthoses: Knee Immobilizer - Left Knee Immobilizer - Left: On except when in CPM Restrictions Weight Bearing Restrictions: Yes RLE Weight Bearing: Weight bearing as tolerated LLE Weight Bearing: Weight bearing as tolerated    Mobility  Bed Mobility               General bed mobility comments: in chair upon entry  Transfers Overall transfer level: Needs assistance Equipment used: Rolling walker (2  wheeled) Transfers: Sit to/from Stand Sit to Stand: Supervision         General transfer comment: reviewed technique adn safety from recliner with RW  Ambulation/Gait Ambulation/Gait assistance: Supervision Gait Distance (Feet): 200 Feet Assistive device: Rolling walker (2 wheeled) Gait Pattern/deviations: Step-through pattern Gait velocity: 0.368m   General Gait Details: moderate support on RW   Stairs             Wheelchair Mobility    Modified Rankin (Stroke Patients Only)       Balance Overall balance assessment: Needs assistance Sitting-balance support: No upper extremity supported;Feet unsupported;Feet supported Sitting balance-Leahy Scale: Normal     Standing balance support: Bilateral upper extremity supported;During functional activity Standing balance-Leahy Scale: Fair                              Cognition Arousal/Alertness: Awake/alert Behavior During Therapy: WFL for tasks assessed/performed Overall Cognitive Status: Within Functional Limits for tasks assessed                                        Exercises Total Joint Exercises Ankle Circles/Pumps: AROM;15 reps;Both Heel Slides: 15 reps;Left;Seated;AAROM;Supine Hip ABduction/ADduction: 10 reps;AROM;Supine;Both Straight Leg Raises: 10 reps;Supine;Both;AROM Long Arc Quad: AROM;10 reps;15 reps;Both;Seated;Limitations Long Ar[employer] Corporationimitations: 10x Left Knee Flexion: AROM;Both;15 reps;10 reps;Seated;Limitations Knee Flexion Limitations: 10x Left Goniometric ROM: 8-79 degrees Left knee flexion A/ROM    General Comments  Pertinent Vitals/Pain Pain Assessment: No/denies pain Pain Location: L knee    Home Living                      Prior Function            PT Goals (current goals can now be found in the care plan section) Acute Rehab PT Goals Patient Stated Goal: go home PT Goal Formulation: With patient Time For Goal Achievement:  10/24/19 Potential to Achieve Goals: Good Progress towards PT goals: Progressing toward goals    Frequency    BID      PT Plan Current plan remains appropriate    Co-evaluation              AM-PAC PT "6 Clicks" Mobility   Outcome Measure  Help needed turning from your back to your side while in a flat bed without using bedrails?: A Little Help needed moving from lying on your back to sitting on the side of a flat bed without using bedrails?: A Little Help needed moving to and from a bed to a chair (including a wheelchair)?: A Little Help needed standing up from a chair using your arms (e.g., wheelchair or bedside chair)?: A Little Help needed to walk in hospital room?: A Little Help needed climbing 3-5 steps with a railing? : A Little 6 Click Score: 18    End of Session Equipment Utilized During Treatment: Gait belt Activity Tolerance: Patient tolerated treatment well;No increased pain Patient left: with call bell/phone within reach;in chair;with chair alarm set;with family/visitor present Nurse Communication: Mobility status PT Visit Diagnosis: Muscle weakness (generalized) (M62.81);Difficulty in walking, not elsewhere classified (R26.2);Pain Pain - Right/Left: Left Pain - part of body: Knee     Time: 1010-1057 PT Time Calculation (min) (ACUTE ONLY): 47 min  Charges:  $Gait Training: 8-22 mins $Therapeutic Exercise: 23-37 mins               12:38 PM, 10/16/19 Etta Grandchild, PT, DPT Physical Therapist - [facility]  [phone] ([place])     [place] C 10/16/2019, 12:34 PM

## 2019-10-16 NOTE — Progress Notes (Signed)
  Subjective:  POD # 6 s/p left TKA and POD #4 s/p liner exchange.   Patient reports left pain as mild.  Patient sitting up out of bed to a chair.  She denies any abdominal pain.  The nausea and vomiting.  Objective:   VITALS:   Vitals:   10/15/19 0732 10/15/19 1626 10/15/19 2307 10/16/19 0802  BP: 107/69 110/69 (!) 123/59 112/72  Pulse: 86 94 89 93  Resp:   16   Temp: 98.3 F (36.8 C) 98.9 F (37.2 C) 100 F (37.8 C) 98.2 F (36.8 C)  TempSrc:   Oral Oral  SpO2: 99% 98% 98% 99%    PHYSICAL EXAM: Left lower extremity Neurovascular intact Sensation intact distally Intact pulses distally Dorsiflexion/Plantar flexion intact Incision: dressing C/D/I No cellulitis present Compartment soft  LABS  No results found for this or any previous visit (from the past 24 hour(s)).  DG Abd 1 View  Result Date: 10/14/2019 CLINICAL DATA:  Ileus, vomiting EXAM: ABDOMEN - 1 VIEW COMPARISON:  None. FINDINGS: Numerous surgical clips and a additional surgical material noted throughout the abdomen. There are several stacked loops of borderline air distended small bowel in mid abdomen. Evaluation for free air limited in the absence of upright imaging. Coarse calcifications project over the level of the left renal shadow including a larger staghorn appearing calculus. Multilevel degenerative changes noted in the spine as well as additional degenerative features in the hips and SI joints. Atelectatic changes in the lung bases. Lung bases are otherwise clear. IMPRESSION: 1. Several stacked loops of borderline air distended small bowel in the mid abdomen. Findings may represent ileus or early small bowel obstruction. 2. Coarse calcifications project over the level of the left renal shadow concerning for nephrolithiasis including a large staghorn appearing calculus. Electronically Signed   By: Lovena Le M.D.   On: 10/14/2019 19:17    Assessment/Plan: 4 Days Post-Op   Active Problems:   S/P total knee  arthroplasty, left   Ileus (HCC)   Intractable nausea and vomiting  The patient is doing well today.  She was seen by Dr. Posey Pronto from the hospitalist service who is signed off as she is now medically stable.  Patient is making progress with physical therapy.  She will be discharged home today.  She will follow up with me in 1-2 weeks in the office.    Thornton Park , MD 10/16/2019, 2:08 PM

## 2019-10-16 NOTE — TOC Progression Note (Signed)
Transition of Care [facility]) - Progression Note    Patient Details  Name: Victoria Peterson MRN: [id number] Date of Birth: [birth year]  [facility] CM/SW Contact  Pavle Wiler, Gardiner Rhyme, LCSW Phone Number: 10/16/2019, 2:46 PM  Clinical Narrative:   Now plan has changed to home health due to MD and PT recommend. Have contacted [facility] to see if can take referral. Pt going to daughter's address [address] and will stay with her until able to go back to her home.    Expected Discharge Plan: Home/Self Care Barriers to Discharge: Barriers Resolved  Expected Discharge Plan and Services Expected Discharge Plan: Home/Self Care       Living arrangements for the past 2 months: Single Family Home Expected Discharge Date: 10/16/19               DME Arranged: [facility] Rile youth DME Agency: [employer] Date DME Agency Contacted: 10/12/19 Time DME Agency Contacted: 1200 Representative spoke with at DME Agency: Leroy Sea             Social Determinants of Health ([place]) Interventions    Readmission Risk Interventions No flowsheet data found.

## 2019-10-16 NOTE — Progress Notes (Signed)
[facility] [place] NAME: Victoria Peterson    MR#:  [id number]  DATE OF BIRTH:  06/19/1941  SUBJECTIVE:   Patient denies any vomiting. She ate some soft diet this morning. Working with physical therapy. Had a large BM. Feels overall better. REVIEW OF SYSTEMS:   Review of Systems  Constitutional: Negative for chills, fever and weight loss.  HENT: Negative for ear discharge, ear pain and nosebleeds.   Eyes: Negative for blurred vision, pain and discharge.  Respiratory: Negative for sputum production, shortness of breath, wheezing and stridor.   Cardiovascular: Negative for chest pain, palpitations, orthopnea and PND.  Gastrointestinal: Negative for abdominal pain, diarrhea, nausea and vomiting.  Genitourinary: Negative for frequency and urgency.  Musculoskeletal: Negative for back pain and joint pain.  Neurological: Negative for sensory change, speech change, focal weakness and weakness.  Psychiatric/Behavioral: Negative for depression and hallucinations. The patient is not nervous/anxious.    Tolerating Diet: soft diet Tolerating PT: rec HHPT  DRUG ALLERGIES:   Allergies  Allergen Reactions  . Cefazolin Other (See Comments)    Hypotension    VITALS:  Blood pressure 112/72, pulse 93, temperature 98.2 F (36.8 C), temperature source Oral, resp. rate 16, SpO2 99 %.  PHYSICAL EXAMINATION:   Physical Exam  GENERAL:  79 y.o.-year-old patient lying in the bed with no acute distress.  EYES: Pupils equal, round, reactive to light and accommodation. No scleral icterus.   HEENT: Head atraumatic, normocephalic. Oropharynx and nasopharynx clear.  NECK:  Supple, no jugular venous distention. No thyroid enlargement, no tenderness.  LUNGS: Normal breath sounds bilaterally, no wheezing, rales, rhonchi. No use of accessory muscles of respiration.  CARDIOVASCULAR: S1, S2 normal. No murmurs, rubs, or gallops.  ABDOMEN: Soft, nontender, nondistended.  Bowel sounds present. No organomegaly or mass. Ileostomy site right LQ EXTREMITIES: No cyanosis, clubbing or edema b/l.   Left LE brace + NEUROLOGIC: Cranial nerves II through XII are intact. No focal Motor or sensory deficits b/l.   PSYCHIATRIC:  patient is alert and oriented x 3.  SKIN: No obvious rash, lesion, or ulcer.   LABORATORY PANEL:  CBC Recent Labs  Lab 10/12/19 0402  WBC 8.1  HGB 9.8*  HCT 28.4*  PLT 193    Chemistries  Recent Labs  Lab 10/10/19 1834 10/10/19 1834 10/11/19 0312  NA 136   < > 136  K 3.6   < > 4.0  CL 105   < > 103  CO2 27   < > 25  GLUCOSE 135*   < > 157*  BUN 13   < > 15  CREATININE 0.81   < > 0.83  CALCIUM 8.0*   < > 8.1*  AST 21  --   --   ALT 13  --   --   ALKPHOS 39  --   --   BILITOT 0.7  --   --    < > = values in this interval not displayed.   Cardiac Enzymes No results for input(s): TROPONINI in the last 168 hours. RADIOLOGY:  DG Abd 1 View  Result Date: 10/14/2019 CLINICAL DATA:  Ileus, vomiting EXAM: ABDOMEN - 1 VIEW COMPARISON:  None. FINDINGS: Numerous surgical clips and a additional surgical material noted throughout the abdomen. There are several stacked loops of borderline air distended small bowel in mid abdomen. Evaluation for free air limited in the absence of upright imaging. Coarse calcifications project over the level of the left renal  shadow including a larger staghorn appearing calculus. Multilevel degenerative changes noted in the spine as well as additional degenerative features in the hips and SI joints. Atelectatic changes in the lung bases. Lung bases are otherwise clear. IMPRESSION: 1. Several stacked loops of borderline air distended small bowel in the mid abdomen. Findings may represent ileus or early small bowel obstruction. 2. Coarse calcifications project over the level of the left renal shadow concerning for nephrolithiasis including a large staghorn appearing calculus. Electronically Signed   By: Lovena Le  M.D.   On: 10/14/2019 19:17   ASSESSMENT AND PLAN:  [name]  is a 79 y.o. female with a known history of severe ulcerative colitis requiring ileostomy, lumbar disc disease, hyperlipidemia, osteoporosis was admitted on orthopedic service for elective left knee arthroplasty surgery was performed on 9 March. She underwent open exchange of mismatch size polyether liner on 11th of March. Patient has been on PO and IV pain meds also although not taking as much started having intractable vomiting since last  1. Intractable nausea vomiting /ileus--resolved -postop  orthopedic procedure left knee -suspected due to pain meds and possible mild constipation--now has had several BM's -KUB-- s/o Ileus -d/ced IVF -tolerating po soft diet -alternate with Zofran and Phenergan PRN as orders -prn pain meds--> try avoid Narcotic if possible  2. History of ileostomy-- chronic for ulcerative colitis -continue to monitor output -good BM's  3. Anemia-- appears postop -hemoglobin 12.9--- 9.8  4. DVT prophylaxis subcu Lovenox  Overall improved. Ok from medical standpoint for discharge. Dr Lisette Grinder informed. Will sign off. Call if needed.  Family communication :patient and dter CODE STATUS: FULL DVT Prophylaxis :lovenox  TOTAL TIME TAKING CARE OF THIS PATIENT: *25* minutes.  >50% time spent on counselling and coordination of care  Note: This dictation was prepared with Dragon dictation along with smaller phrase technology. Any transcriptional errors that result from this process are unintentional.  Fritzi Mandes M.D    [facility]   CC: Primary care physician; Valerie Roys, DOPatient ID: Victoria Peterson, female   DOB: 06/06/1941, 79 y.o.   MRN: [id number]

## 2019-10-16 NOTE — Care Management Important Message (Signed)
Important Message  Patient Details  Name: Victoria Peterson MRN: [id number] Date of Birth: 1941/02/13   Medicare Important Message Given:  Yes     Juliann Pulse A Eliezer Khawaja 10/16/2019, 11:47 AM

## 2019-10-16 NOTE — Progress Notes (Signed)
Physical Therapy Treatment Patient Details Name: Victoria Peterson MRN: [id number] DOB: February 16, 1941 Today's Date: 10/16/2019    History of Present Illness 79 y/o female s/p L TKA 10/10/19, pt transfered to ICU on POD0 for hypotension episode. Pt s/p post-op revision 2 days later for polyethylene spacer exchange (3/11).    PT Comments    Pt still in chair upon entry, appears comfortable. Author reviews in full HEP with self assist techniques using gait belt for heel slides and SLR, albeit pt able to SLR and SAQ without any assist. Reviewed HEP compliance at home.  Reviewed education on polar care use and KI use at night to promote TKE. Pt AMB 263f again around unit, slight improved gait speed and only 1 standing rest interval this session. Pt endorses confidence for return to home.   Follow Up Recommendations  Home health PT;Supervision for mobility/OOB     Equipment Recommendations  Rolling walker with 5" wheels    Recommendations for Other Services       Precautions / Restrictions Precautions Precautions: Fall;Knee Precaution Booklet Issued: Yes (comment) Precaution Comments: HEP hand out Required Braces or Orthoses: Knee Immobilizer - Left Knee Immobilizer - Left: Other (comment)(on when resting to promote TKE in chair/bed) Restrictions RLE Weight Bearing: Weight bearing as tolerated LLE Weight Bearing: Weight bearing as tolerated    Mobility  Bed Mobility               General bed mobility comments: in chair upon entry  Transfers Overall transfer level: Needs assistance Equipment used: Rolling walker (2 wheeled) Transfers: Sit to/from Stand Sit to Stand: Modified independent (Device/Increase time)         General transfer comment: reviewed technique adn safety from recliner with RW  Ambulation/Gait Ambulation/Gait assistance: Supervision Gait Distance (Feet): 200 Feet Assistive device: Rolling walker (2 wheeled) Gait Pattern/deviations: Step-through  pattern;Antalgic Gait velocity: 0.342m   General Gait Details: moderate support on RW   Stairs             Wheelchair Mobility    Modified Rankin (Stroke Patients Only)       Balance Overall balance assessment: Needs assistance Sitting-balance support: No upper extremity supported;Feet unsupported;Feet supported Sitting balance-Leahy Scale: Normal     Standing balance support: Bilateral upper extremity supported;During functional activity Standing balance-Leahy Scale: Fair                              Cognition Arousal/Alertness: Awake/alert Behavior During Therapy: WFL for tasks assessed/performed Overall Cognitive Status: Within Functional Limits for tasks assessed                                        Exercises      General Comments        Pertinent Vitals/Pain Pain Assessment: No/denies pain Pain Location: L knee Pain Intervention(s): Limited activity within patient's tolerance;Monitored during session;Premedicated before session;Repositioned;Ice applied    Home Living                      Prior Function            PT Goals (current goals can now be found in the care plan section) Acute Rehab PT Goals Patient Stated Goal: go home PT Goal Formulation: With patient Time For Goal Achievement: 10/24/19 Potential to Achieve Goals: Good Progress towards  PT goals: Progressing toward goals    Frequency    BID      PT Plan Current plan remains appropriate    Co-evaluation              AM-PAC PT "6 Clicks" Mobility   Outcome Measure  Help needed turning from your back to your side while in a flat bed without using bedrails?: A Little Help needed moving from lying on your back to sitting on the side of a flat bed without using bedrails?: A Little Help needed moving to and from a bed to a chair (including a wheelchair)?: A Little Help needed standing up from a chair using your arms (e.g., wheelchair  or bedside chair)?: A Little Help needed to walk in hospital room?: A Little Help needed climbing 3-5 steps with a railing? : A Little 6 Click Score: 18    End of Session Equipment Utilized During Treatment: Gait belt Activity Tolerance: Patient tolerated treatment well;No increased pain Patient left: with call bell/phone within reach;in chair;with chair alarm set;with family/visitor present Nurse Communication: Mobility status PT Visit Diagnosis: Muscle weakness (generalized) (M62.81);Difficulty in walking, not elsewhere classified (R26.2);Pain Pain - Right/Left: Left Pain - part of body: Knee     Time: 1400-1433 PT Time Calculation (min) (ACUTE ONLY): 33 min  Charges:  $Gait Training: 8-22 mins $Therapeutic Exercise: 8-22 mins                     2:43 PM, 10/16/19 Etta Grandchild, PT, DPT Physical Therapist - [facility]  [phone] ([place])     Rutherford C 10/16/2019, 2:40 PM
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
## 2019-10-18 NOTE — TOC Progression Note (Signed)
Transition of Care [facility]) - Progression Note    Patient Details  Name: KATERA RYBKA MRN: [id number] Date of Birth: July 30, 1941  Transition of [facility]) CM/SW [place], RN Phone Number: 10/18/2019, 3:41 PM  Clinical Narrative:     Received call from Dr Harden Mo office saying that the patient has not been seen yet.  I called Helene Kelp with [facility] and she stated that the patient is scheduled to be seen, I explained that the physician's office has called and the patient needs seen, she stated that Home health is so pushed out due to being so busy. I called Dr Harden Mo office and notified them of the information, They will call the patient and schedule the patient for outpatient in the office  Expected Discharge Plan: Home/Self Care Barriers to Discharge: Barriers Resolved  Expected Discharge Plan and Services Expected Discharge Plan: Home/Self Care       Living arrangements for the past 2 months: Single Family Home Expected Discharge Date: 10/16/19               DME Arranged: [facility] youth DME Agency: [employer] Date DME Agency Contacted: 10/12/19 Time DME Agency Contacted: 1200 Representative spoke with at DME Agency: Leroy Sea             Social Determinants of Health ([place]) Interventions    Readmission Risk Interventions No flowsheet data found.

## 2019-10-19 NOTE — Anesthesia Postprocedure Evaluation (Signed)
Anesthesia Post Note  Patient: Victoria Peterson  Procedure(s) Performed: TOTAL KNEE ARTHROPLASTY POLLY EXCHANGE (Left Knee)  Patient location during evaluation: PACU Anesthesia Type: General Level of consciousness: awake and alert Pain management: pain level controlled Vital Signs Assessment: post-procedure vital signs reviewed and stable Respiratory status: spontaneous breathing, nonlabored ventilation, respiratory function stable and patient connected to nasal cannula oxygen Cardiovascular status: blood pressure returned to baseline and stable Postop Assessment: no apparent nausea or vomiting Anesthetic complications: no     Last Vitals:  Vitals:   10/16/19 0802 10/16/19 1616  BP: 112/72 138/80  Pulse: 93 92  Resp:    Temp: 36.8 C 36.9 C  SpO2: 99% 100%    Last Pain:  Vitals:   10/16/19 1616  TempSrc: Oral  PainSc:                  Alphonsus Sias

## 2019-10-20 DIAGNOSIS — M25662 Stiffness of left knee, not elsewhere classified: Secondary | ICD-10-CM | POA: Diagnosis not present

## 2019-10-20 DIAGNOSIS — M25562 Pain in left knee: Secondary | ICD-10-CM | POA: Diagnosis not present

## 2019-10-20 DIAGNOSIS — Z9889 Other specified postprocedural states: Secondary | ICD-10-CM | POA: Diagnosis not present

## 2019-10-27 DIAGNOSIS — M25662 Stiffness of left knee, not elsewhere classified: Secondary | ICD-10-CM | POA: Diagnosis not present

## 2019-10-27 DIAGNOSIS — M25562 Pain in left knee: Secondary | ICD-10-CM | POA: Diagnosis not present

## 2019-11-03 DIAGNOSIS — M25562 Pain in left knee: Secondary | ICD-10-CM | POA: Diagnosis not present

## 2019-11-03 DIAGNOSIS — M25662 Stiffness of left knee, not elsewhere classified: Secondary | ICD-10-CM | POA: Diagnosis not present

## 2019-11-10 DIAGNOSIS — M25562 Pain in left knee: Secondary | ICD-10-CM | POA: Diagnosis not present

## 2019-11-10 DIAGNOSIS — M25662 Stiffness of left knee, not elsewhere classified: Secondary | ICD-10-CM | POA: Diagnosis not present

## 2019-11-17 DIAGNOSIS — M25662 Stiffness of left knee, not elsewhere classified: Secondary | ICD-10-CM | POA: Diagnosis not present

## 2019-11-17 DIAGNOSIS — M25562 Pain in left knee: Secondary | ICD-10-CM | POA: Diagnosis not present

## 2019-11-24 DIAGNOSIS — Z96652 Presence of left artificial knee joint: Secondary | ICD-10-CM | POA: Diagnosis not present

## 2019-11-24 DIAGNOSIS — M25662 Stiffness of left knee, not elsewhere classified: Secondary | ICD-10-CM | POA: Diagnosis not present

## 2019-11-24 DIAGNOSIS — M25562 Pain in left knee: Secondary | ICD-10-CM | POA: Diagnosis not present

## 2019-12-01 DIAGNOSIS — M25662 Stiffness of left knee, not elsewhere classified: Secondary | ICD-10-CM | POA: Diagnosis not present

## 2019-12-01 DIAGNOSIS — M25562 Pain in left knee: Secondary | ICD-10-CM | POA: Diagnosis not present

## 2019-12-08 DIAGNOSIS — M25662 Stiffness of left knee, not elsewhere classified: Secondary | ICD-10-CM | POA: Diagnosis not present

## 2019-12-08 DIAGNOSIS — M25562 Pain in left knee: Secondary | ICD-10-CM | POA: Diagnosis not present

## 2019-12-11 DIAGNOSIS — R200 Anesthesia of skin: Secondary | ICD-10-CM | POA: Diagnosis not present

## 2019-12-11 DIAGNOSIS — R202 Paresthesia of skin: Secondary | ICD-10-CM | POA: Diagnosis not present

## 2019-12-11 DIAGNOSIS — R208 Other disturbances of skin sensation: Secondary | ICD-10-CM | POA: Diagnosis not present

## 2019-12-13 ENCOUNTER — Other Ambulatory Visit: Payer: Self-pay | Admitting: Neurology

## 2019-12-13 ENCOUNTER — Other Ambulatory Visit (HOSPITAL_COMMUNITY): Payer: Self-pay | Admitting: Neurology

## 2019-12-13 DIAGNOSIS — R208 Other disturbances of skin sensation: Secondary | ICD-10-CM

## 2019-12-20 DIAGNOSIS — M1711 Unilateral primary osteoarthritis, right knee: Secondary | ICD-10-CM | POA: Diagnosis not present

## 2019-12-25 ENCOUNTER — Ambulatory Visit
Admission: RE | Admit: 2019-12-25 | Discharge: 2019-12-25 | Disposition: A | Discharge status: Home / Self Care | Payer: Medicare Other | Source: Ambulatory Visit | Attending: Neurology | Admitting: Neurology

## 2019-12-25 ENCOUNTER — Other Ambulatory Visit: Payer: Self-pay

## 2019-12-25 DIAGNOSIS — R208 Other disturbances of skin sensation: Secondary | ICD-10-CM | POA: Diagnosis not present

## 2019-12-25 DIAGNOSIS — M4802 Spinal stenosis, cervical region: Secondary | ICD-10-CM | POA: Diagnosis not present

## 2019-12-27 DIAGNOSIS — R52 Pain, unspecified: Secondary | ICD-10-CM | POA: Diagnosis not present

## 2019-12-27 DIAGNOSIS — G5603 Carpal tunnel syndrome, bilateral upper limbs: Secondary | ICD-10-CM | POA: Diagnosis not present

## 2019-12-27 DIAGNOSIS — E559 Vitamin D deficiency, unspecified: Secondary | ICD-10-CM | POA: Diagnosis not present

## 2019-12-27 DIAGNOSIS — G2581 Restless legs syndrome: Secondary | ICD-10-CM | POA: Diagnosis not present

## 2019-12-27 DIAGNOSIS — M4802 Spinal stenosis, cervical region: Secondary | ICD-10-CM | POA: Diagnosis not present

## 2020-01-02 ENCOUNTER — Other Ambulatory Visit: Payer: Self-pay | Admitting: Neurosurgery

## 2020-01-02 ENCOUNTER — Other Ambulatory Visit (HOSPITAL_COMMUNITY): Payer: Self-pay | Admitting: Neurosurgery

## 2020-01-02 DIAGNOSIS — G959 Disease of spinal cord, unspecified: Secondary | ICD-10-CM | POA: Diagnosis not present

## 2020-01-02 DIAGNOSIS — M4802 Spinal stenosis, cervical region: Secondary | ICD-10-CM | POA: Diagnosis not present

## 2020-01-12 ENCOUNTER — Other Ambulatory Visit: Payer: Self-pay

## 2020-01-12 ENCOUNTER — Ambulatory Visit
Admission: RE | Admit: 2020-01-12 | Discharge: 2020-01-12 | Disposition: A | Discharge status: Home / Self Care | Payer: Medicare Other | Source: Ambulatory Visit | Attending: Neurosurgery | Admitting: Neurosurgery

## 2020-01-12 DIAGNOSIS — M5030 Other cervical disc degeneration, unspecified cervical region: Secondary | ICD-10-CM | POA: Diagnosis not present

## 2020-01-12 DIAGNOSIS — M4802 Spinal stenosis, cervical region: Secondary | ICD-10-CM | POA: Diagnosis not present

## 2020-01-15 ENCOUNTER — Other Ambulatory Visit: Payer: Self-pay | Admitting: Neurosurgery

## 2020-01-26 DIAGNOSIS — M5000 Cervical disc disorder with myelopathy, unspecified cervical region: Secondary | ICD-10-CM | POA: Diagnosis not present

## 2020-02-06 ENCOUNTER — Encounter: Payer: Medicare Other | Admitting: Nurse Practitioner

## 2020-02-12 ENCOUNTER — Other Ambulatory Visit: Payer: Self-pay

## 2020-02-12 ENCOUNTER — Encounter
Admission: RE | Admit: 2020-02-12 | Discharge: 2020-02-12 | Disposition: A | Discharge status: Home / Self Care | Payer: Medicare Other | Source: Ambulatory Visit | Attending: Neurosurgery | Admitting: Neurosurgery

## 2020-02-12 NOTE — Patient Instructions (Signed)
COVID TESTING Date: February 15, 2020 THURSDAY Testing site:  [facility] [address] Thru Hours:  7:78 am - 1:00 pm Once you are tested, you are asked to stay quarantined (avoiding public places) until after your surgery.   Your procedure is scheduled on: February 19, 2020 MONDAY Report to Day Surgery on the 2nd floor of the [facility]. To find out your arrival time, please call [phone] between 1PM - 3PM on: Friday  February 16, 2020  REMEMBER: Instructions that are not followed completely may result in serious medical risk, up to and including death; or upon the discretion of your surgeon and anesthesiologist your surgery may need to be rescheduled.  Do not eat food after midnight the night before surgery.  No gum chewing, lozengers or hard candies.  You may however, drink CLEAR liquids up to 2 hours before you are scheduled to arrive for your surgery. Do not drink anything within 2 hours of your scheduled arrival time.  Clear liquids include: - water  - apple juice without pulp - gatorade (not RED) - black coffee or tea (Do NOT add milk or creamers to the coffee or tea) Do NOT drink anything that is not on this list.  Type 1 and Type 2 diabetics should only drink water.    TAKE THESE MEDICATIONS THE MORNING OF SURGERY WITH A SIP OF WATER: NONE  STOP ASPIRIN 7 DAYS BEFORE SURGERY  Stop Anti-inflammatories (NSAIDS) such as Advil, Aleve, Ibuprofen, Motrin, Naproxen, Naprosyn and Aspirin based products such as Excedrin, Goodys Powder, BC Powder. (May take Tylenol or Acetaminophen if needed.)  Stop ANY OVER THE COUNTER supplements until after surgery. (May continue Vitamin D, Vitamin B, and multivitamin.)  No Alcohol for 24 hours before or after surgery.  No Smoking including e-cigarettes for 24 hours prior to surgery.  No chewable tobacco products for at least 6 hours prior to surgery.  No nicotine patches on the day of surgery.  Do not use  any "recreational" drugs for at least a week prior to your surgery.  Please be advised that the combination of cocaine and anesthesia may have negative outcomes, up to and including death. If you test positive for cocaine, your surgery will be cancelled.  On the morning of surgery brush your teeth with toothpaste and water, you may rinse your mouth with mouthwash if you wish. Do not swallow any toothpaste or mouthwash.  Do not wear jewelry, make-up, hairpins, clips or nail polish.  Do not wear lotions, powders, or perfumes.   Do not shave 48 hours prior to surgery.   Contact lenses, hearing aids and dentures may not be worn into surgery.  Do not bring valuables to the hospital. [facility] is not responsible for any missing/lost belongings or valuables.   Use CHG Soap  as directed on instruction sheet.  Notify your doctor if there is any change in your medical condition (cold, fever, infection).  Wear comfortable clothing (specific to your surgery type) to the hospital.  Plan for stool softeners for home use; pain medications have a tendency to cause constipation. You can also help prevent constipation by eating foods high in fiber such as fruits and vegetables and drinking plenty of fluids as your diet allows.  After surgery, you can help prevent lung complications by doing breathing exercises.  Take deep breaths and cough every 1-2 hours. Your doctor may order a device called an Incentive Spirometer to help you take deep breaths. When coughing  or sneezing, hold a pillow firmly against your incision with both hands. This is called "splinting." Doing this helps protect your incision. It also decreases belly discomfort.  If you are being admitted to the hospital overnight, MAY BRING [place]. After surgery it may be brought to your room.  If you are being discharged the day of surgery, you will not be allowed to drive home. You will need a responsible adult (18 years or  older) to drive you home and stay with you that night.   If you are taking public transportation, you will need to have a responsible adult (18 years or older) with you. Please confirm with your physician that it is acceptable to use public transportation.   Please call the [place] Dept. at [phone] if you have any questions about these instructions.  Visitation Policy:  Patients undergoing a surgery or procedure may have one family member or support person with them as long as that person is not COVID-19 positive or experiencing its symptoms.  That person may remain in the waiting area during the procedure.  Children under 24 years of age may have both parents or legal guardians with them during their procedure.  Inpatient Visitation Update:   Two designated support people may visit a patient during visiting hours 7 am to 8 pm. It must be the same two designated people for the duration of the patient stay. The visitors may come and go during the day, and there is no switching out to have different visitors. A mask must be worn at all times, including in the patient room.  Children under 74 years of age:  a total of 4 designated visitors for the child's entire stay are allowed. Only 2 in the room at a time and only one staying overnight at a time. The overnight guest can now rotate during the child's hospital stay.  As a reminder, masks are still required for all [facility] team members, patients and visitors in all [facility] facilities.   Systemwide, no visitors 17 or younger.

## 2020-02-14 DIAGNOSIS — M5416 Radiculopathy, lumbar region: Secondary | ICD-10-CM | POA: Diagnosis not present

## 2020-02-14 DIAGNOSIS — M1712 Unilateral primary osteoarthritis, left knee: Secondary | ICD-10-CM | POA: Diagnosis not present

## 2020-02-15 ENCOUNTER — Other Ambulatory Visit: Payer: Self-pay

## 2020-02-15 ENCOUNTER — Encounter: Payer: Self-pay | Admitting: Urgent Care

## 2020-02-15 ENCOUNTER — Encounter
Admission: RE | Admit: 2020-02-15 | Discharge: 2020-02-15 | Disposition: A | Discharge status: Home / Self Care | Payer: Medicare Other | Source: Ambulatory Visit | Attending: Neurosurgery | Admitting: Neurosurgery

## 2020-02-15 DIAGNOSIS — Z01812 Encounter for preprocedural laboratory examination: Secondary | ICD-10-CM | POA: Insufficient documentation

## 2020-02-15 DIAGNOSIS — Z20822 Contact with and (suspected) exposure to covid-19: Secondary | ICD-10-CM | POA: Insufficient documentation

## 2020-02-15 LAB — TYPE AND SCREEN
ABO/RH(D): B POS
Antibody Screen: NEGATIVE

## 2020-02-15 LAB — CBC
HCT: 34.9 % — ABNORMAL LOW (ref 36.0–46.0)
Hemoglobin: 11.9 g/dL — ABNORMAL LOW (ref 12.0–15.0)
MCH: 28.1 pg (ref 26.0–34.0)
MCHC: 34.1 g/dL (ref 30.0–36.0)
MCV: 82.3 fL (ref 80.0–100.0)
Platelets: 265 10*3/uL (ref 150–400)
RBC: 4.24 MIL/uL (ref 3.87–5.11)
RDW: 13.9 % (ref 11.5–15.5)
WBC: 4 10*3/uL (ref 4.0–10.5)
nRBC: 0 % (ref 0.0–0.2)

## 2020-02-15 LAB — BASIC METABOLIC PANEL
Anion gap: 6 (ref 5–15)
BUN: 14 mg/dL (ref 8–23)
CO2: 26 mmol/L (ref 22–32)
Calcium: 8.9 mg/dL (ref 8.9–10.3)
Chloride: 108 mmol/L (ref 98–111)
Creatinine, Ser: 0.83 mg/dL (ref 0.44–1.00)
GFR calc Af Amer: >60 mL/min (ref >60)
GFR calc non Af Amer: >60 mL/min (ref >60)
Glucose, Bld: 101 mg/dL — ABNORMAL HIGH (ref 70–99)
Potassium: 4.1 mmol/L (ref 3.5–5.1)
Sodium: 140 mmol/L (ref 135–145)

## 2020-02-15 LAB — URINALYSIS, ROUTINE W REFLEX MICROSCOPIC
Bilirubin Urine: NEGATIVE
Glucose, UA: NEGATIVE mg/dL
Ketones, ur: NEGATIVE mg/dL
Nitrite: NEGATIVE
Protein, ur: 30 mg/dL — AB
Specific Gravity, Urine: 1.016 (ref 1.005–1.030)
pH: 5 (ref 5.0–8.0)

## 2020-02-15 LAB — PROTIME-INR
INR: 1 (ref 0.8–1.2)
Prothrombin Time: 13.2 seconds (ref 11.4–15.2)

## 2020-02-15 LAB — APTT: aPTT: 30 seconds (ref 24–36)

## 2020-02-15 LAB — SURGICAL PCR SCREEN
MRSA, PCR: NEGATIVE
Staphylococcus aureus: NEGATIVE

## 2020-02-15 LAB — SARS CORONAVIRUS 2 (TAT 6-24 HRS): SARS Coronavirus 2: NEGATIVE

## 2020-02-15 NOTE — Progress Notes (Signed)
°  [facility] Perioperative Services: Pre-Admission/Anesthesia Testing  Abnormal Lab Notification    Date: 02/15/20  Name: Victoria Peterson MRN:   [id number]  Re: Abnormal labs noted during PAT appointment   Provider Notified: Deetta Perla, MD Notification mode: Faxed via [facility]   ABNORMAL LAB VALUE(S):  Lab Results  Component Value Date  COLORURINE YELLOW (A) 02/15/2020  APPEARANCEUR CLOUDY (A) 02/15/2020  LABSPEC 1.016 02/15/2020  PHURINE 5.0 02/15/2020  GLUCOSEU NEGATIVE 02/15/2020  HGBUR MODERATE (A) 02/15/2020  BILIRUBINUR NEGATIVE 02/15/2020  KETONESUR NEGATIVE 02/15/2020  PROTEINUR 30 (A) 02/15/2020  NITRITE NEGATIVE 02/15/2020  LEUKOCYTESUR LARGE (A) 02/15/2020  EPIU 0-5 02/15/2020  WBCU 21-50 02/15/2020  RBCU 21-50 02/15/2020  BACTERIA RARE (A) 02/15/2020   Notes: UA consistent with infection. C&S added to assess for pathogenically significant growth. Patient scheduled for ACDF on 02/19/2020 with Dr. Lacinda Axon. Will forward UA results to surgeon for review and treatment.  Honor Loh, MSN, APRN, FNP-C, CEN [facility]  Peri-operative Services Nurse Practitioner Phone: [phone] 02/15/20 12:42 PM

## 2020-02-16 LAB — URINE CULTURE: Culture: <10000 — AB

## 2020-02-19 ENCOUNTER — Ambulatory Visit: Payer: Medicare Other | Admitting: Urgent Care

## 2020-02-19 ENCOUNTER — Encounter
Admission: RE | Disposition: A | Discharge status: Home / Self Care | Payer: Self-pay | Source: Home / Self Care | Attending: Neurosurgery

## 2020-02-19 ENCOUNTER — Encounter: Payer: Self-pay | Admitting: Neurosurgery

## 2020-02-19 ENCOUNTER — Observation Stay
Admission: RE | Admit: 2020-02-19 | Discharge: 2020-02-20 | Disposition: A | Discharge status: Home / Self Care | Payer: Medicare Other | Attending: Neurosurgery | Admitting: Neurosurgery

## 2020-02-19 ENCOUNTER — Observation Stay: Payer: Medicare Other

## 2020-02-19 ENCOUNTER — Ambulatory Visit: Payer: Medicare Other

## 2020-02-19 ENCOUNTER — Other Ambulatory Visit: Payer: Self-pay

## 2020-02-19 DIAGNOSIS — R531 Weakness: Secondary | ICD-10-CM | POA: Insufficient documentation

## 2020-02-19 DIAGNOSIS — M5000 Cervical disc disorder with myelopathy, unspecified cervical region: Principal | ICD-10-CM | POA: Insufficient documentation

## 2020-02-19 DIAGNOSIS — M4322 Fusion of spine, cervical region: Secondary | ICD-10-CM | POA: Diagnosis not present

## 2020-02-19 DIAGNOSIS — R200 Anesthesia of skin: Secondary | ICD-10-CM | POA: Diagnosis not present

## 2020-02-19 DIAGNOSIS — Z419 Encounter for procedure for purposes other than remedying health state, unspecified: Secondary | ICD-10-CM

## 2020-02-19 DIAGNOSIS — Z79899 Other long term (current) drug therapy: Secondary | ICD-10-CM | POA: Diagnosis not present

## 2020-02-19 DIAGNOSIS — M5020 Other cervical disc displacement, unspecified cervical region: Secondary | ICD-10-CM | POA: Diagnosis not present

## 2020-02-19 DIAGNOSIS — R202 Paresthesia of skin: Secondary | ICD-10-CM | POA: Insufficient documentation

## 2020-02-19 DIAGNOSIS — Z7982 Long term (current) use of aspirin: Secondary | ICD-10-CM | POA: Insufficient documentation

## 2020-02-19 DIAGNOSIS — M50023 Cervical disc disorder at C6-C7 level with myelopathy: Secondary | ICD-10-CM | POA: Diagnosis not present

## 2020-02-19 DIAGNOSIS — M4802 Spinal stenosis, cervical region: Secondary | ICD-10-CM | POA: Diagnosis not present

## 2020-02-19 DIAGNOSIS — Z96652 Presence of left artificial knee joint: Secondary | ICD-10-CM | POA: Diagnosis not present

## 2020-02-19 DIAGNOSIS — G959 Disease of spinal cord, unspecified: Secondary | ICD-10-CM | POA: Diagnosis present

## 2020-02-19 DIAGNOSIS — M50021 Cervical disc disorder at C4-C5 level with myelopathy: Secondary | ICD-10-CM | POA: Diagnosis not present

## 2020-02-19 HISTORY — PX: ANTERIOR CERVICAL DECOMP/DISCECTOMY FUSION: SHX1161

## 2020-02-19 SURGERY — ANTERIOR CERVICAL DECOMPRESSION/DISCECTOMY FUSION 3 LEVELS
Anesthesia: General

## 2020-02-19 MED ORDER — SUCCINYLCHOLINE CHLORIDE 20 MG/ML IJ SOLN
INTRAMUSCULAR | Status: DC | PRN
  Administered 2020-02-19: 100 mg via INTRAVENOUS

## 2020-02-19 MED ORDER — PROPOFOL 500 MG/50ML IV EMUL
INTRAVENOUS | Status: AC
  Filled 2020-02-19: qty 50

## 2020-02-19 MED ORDER — DEXAMETHASONE SODIUM PHOSPHATE 10 MG/ML IJ SOLN
INTRAMUSCULAR | Status: AC
  Filled 2020-02-19: qty 1

## 2020-02-19 MED ORDER — REMIFENTANIL HCL 1 MG IV SOLR
INTRAVENOUS | Status: DC | PRN
  Administered 2020-02-19: .1 ug/kg/min via INTRAVENOUS

## 2020-02-19 MED ORDER — METHOCARBAMOL 1000 MG/10ML IJ SOLN
500.0000 mg | Freq: Four times a day (QID) | INTRAVENOUS | Status: DC | PRN
  Filled 2020-02-19: qty 5

## 2020-02-19 MED ORDER — DEXAMETHASONE SODIUM PHOSPHATE 10 MG/ML IJ SOLN
INTRAMUSCULAR | Status: DC | PRN
  Administered 2020-02-19: 10 mg via INTRAVENOUS

## 2020-02-19 MED ORDER — OXYCODONE HCL 5 MG PO TABS: 10.0000 mg | ORAL_TABLET | ORAL | Status: DC | PRN

## 2020-02-19 MED ORDER — CHLORHEXIDINE GLUCONATE 0.12 % MT SOLN
OROMUCOSAL | Status: AC
  Administered 2020-02-19: 15 mL via OROMUCOSAL
  Filled 2020-02-19: qty 15

## 2020-02-19 MED ORDER — METHOCARBAMOL 500 MG PO TABS
500.0000 mg | ORAL_TABLET | Freq: Four times a day (QID) | ORAL | Status: DC | PRN
  Administered 2020-02-20: 500 mg via ORAL
  Filled 2020-02-19: qty 1

## 2020-02-19 MED ORDER — SUCCINYLCHOLINE CHLORIDE 200 MG/10ML IV SOSY
PREFILLED_SYRINGE | INTRAVENOUS | Status: AC
  Filled 2020-02-19: qty 10

## 2020-02-19 MED ORDER — ACETAMINOPHEN 10 MG/ML IV SOLN
INTRAVENOUS | Status: AC
  Filled 2020-02-19: qty 100

## 2020-02-19 MED ORDER — ARTIFICIAL TEARS OPHTHALMIC OINT
TOPICAL_OINTMENT | OPHTHALMIC | Status: AC
  Filled 2020-02-19: qty 3.5

## 2020-02-19 MED ORDER — REMIFENTANIL HCL 1 MG IV SOLR
INTRAVENOUS | Status: AC
  Filled 2020-02-19: qty 1000

## 2020-02-19 MED ORDER — SODIUM CHLORIDE 0.9% FLUSH
3.0000 mL | Freq: Two times a day (BID) | INTRAVENOUS | Status: DC
  Administered 2020-02-19 – 2020-02-20 (×2): 3 mL via INTRAVENOUS

## 2020-02-19 MED ORDER — ACETAMINOPHEN 325 MG PO TABS: 650.0000 mg | ORAL_TABLET | ORAL | Status: DC | PRN

## 2020-02-19 MED ORDER — NITROFURANTOIN MONOHYD MACRO 100 MG PO CAPS
100.0000 mg | ORAL_CAPSULE | Freq: Two times a day (BID) | ORAL | Status: DC
  Administered 2020-02-19 – 2020-02-20 (×2): 100 mg via ORAL
  Filled 2020-02-19 (×3): qty 1

## 2020-02-19 MED ORDER — PROPOFOL 500 MG/50ML IV EMUL
INTRAVENOUS | Status: DC | PRN
  Administered 2020-02-19: 150 ug/kg/min via INTRAVENOUS

## 2020-02-19 MED ORDER — MEPERIDINE HCL 50 MG/ML IJ SOLN: 6.2500 mg | INTRAMUSCULAR | Status: DC | PRN

## 2020-02-19 MED ORDER — LIDOCAINE HCL (PF) 2 % IJ SOLN
INTRAMUSCULAR | Status: AC
  Filled 2020-02-19: qty 5

## 2020-02-19 MED ORDER — ACETAMINOPHEN 650 MG RE SUPP
650.0000 mg | RECTAL | Status: DC | PRN
  Filled 2020-02-19: qty 1

## 2020-02-19 MED ORDER — LACTATED RINGERS IV SOLN: INTRAVENOUS | Status: DC | PRN

## 2020-02-19 MED ORDER — ONDANSETRON HCL 4 MG PO TABS: 4.0000 mg | ORAL_TABLET | Freq: Four times a day (QID) | ORAL | Status: DC | PRN

## 2020-02-19 MED ORDER — HYDROMORPHONE HCL 1 MG/ML IJ SOLN: 0.5000 mg | INTRAMUSCULAR | Status: DC | PRN

## 2020-02-19 MED ORDER — LACTATED RINGERS IV SOLN: INTRAVENOUS | Status: DC

## 2020-02-19 MED ORDER — CIPROFLOXACIN IN D5W 400 MG/200ML IV SOLN
INTRAVENOUS | Status: AC
  Filled 2020-02-19: qty 200

## 2020-02-19 MED ORDER — SODIUM CHLORIDE 0.9 % IV SOLN
250.0000 mL | INTRAVENOUS | Status: DC
  Administered 2020-02-19: 250 mL via INTRAVENOUS

## 2020-02-19 MED ORDER — HYDRALAZINE HCL 20 MG/ML IJ SOLN
10.0000 mg | Freq: Four times a day (QID) | INTRAMUSCULAR | Status: DC | PRN
  Filled 2020-02-19: qty 0.5

## 2020-02-19 MED ORDER — ORAL CARE MOUTH RINSE: 15.0000 mL | Freq: Once | OROMUCOSAL | Status: AC

## 2020-02-19 MED ORDER — PROPOFOL 10 MG/ML IV BOLUS
INTRAVENOUS | Status: DC | PRN
  Administered 2020-02-19: 150 mg via INTRAVENOUS

## 2020-02-19 MED ORDER — SODIUM CHLORIDE 0.9% FLUSH: 3.0000 mL | INTRAVENOUS | Status: DC | PRN

## 2020-02-19 MED ORDER — EPHEDRINE 5 MG/ML INJ
INTRAVENOUS | Status: AC
  Filled 2020-02-19: qty 10

## 2020-02-19 MED ORDER — CHLORHEXIDINE GLUCONATE 0.12 % MT SOLN: 15.0000 mL | Freq: Once | OROMUCOSAL | Status: AC

## 2020-02-19 MED ORDER — POLYETHYLENE GLYCOL 3350 17 G PO PACK
17.0000 g | PACK | Freq: Every day | ORAL | Status: DC | PRN
  Filled 2020-02-19 (×2): qty 1

## 2020-02-19 MED ORDER — ONDANSETRON HCL 4 MG/2ML IJ SOLN
INTRAMUSCULAR | Status: DC | PRN
  Administered 2020-02-19: 4 mg via INTRAVENOUS

## 2020-02-19 MED ORDER — LIDOCAINE-EPINEPHRINE 1 %-1:100000 IJ SOLN
INTRAMUSCULAR | Status: DC | PRN
  Administered 2020-02-19: 5 mL

## 2020-02-19 MED ORDER — FENTANYL CITRATE (PF) 100 MCG/2ML IJ SOLN
INTRAMUSCULAR | Status: DC | PRN
  Administered 2020-02-19 (×2): 50 ug via INTRAVENOUS

## 2020-02-19 MED ORDER — ROPINIROLE HCL 1 MG PO TABS
0.5000 mg | ORAL_TABLET | Freq: Every day | ORAL | Status: DC
  Administered 2020-02-19: 0.5 mg via ORAL
  Filled 2020-02-19: qty 1

## 2020-02-19 MED ORDER — OXYCODONE HCL 5 MG PO TABS
5.0000 mg | ORAL_TABLET | ORAL | Status: DC | PRN
  Administered 2020-02-19: 5 mg via ORAL
  Filled 2020-02-19: qty 1

## 2020-02-19 MED ORDER — LIDOCAINE HCL (CARDIAC) PF 100 MG/5ML IV SOSY
PREFILLED_SYRINGE | INTRAVENOUS | Status: DC | PRN
  Administered 2020-02-19: 60 mg via INTRAVENOUS

## 2020-02-19 MED ORDER — CIPROFLOXACIN IN D5W 400 MG/200ML IV SOLN
400.0000 mg | Freq: Once | INTRAVENOUS | Status: AC
  Administered 2020-02-19 (×2): 400 mg via INTRAVENOUS

## 2020-02-19 MED ORDER — PHENYLEPHRINE HCL (PRESSORS) 10 MG/ML IV SOLN
INTRAVENOUS | Status: AC
  Filled 2020-02-19: qty 1

## 2020-02-19 MED ORDER — PROMETHAZINE HCL 25 MG/ML IJ SOLN: 6.2500 mg | INTRAMUSCULAR | Status: DC | PRN

## 2020-02-19 MED ORDER — THROMBIN 5000 UNITS EX SOLR
CUTANEOUS | Status: DC | PRN
  Administered 2020-02-19: 5000 [IU] via TOPICAL

## 2020-02-19 MED ORDER — FENTANYL CITRATE (PF) 100 MCG/2ML IJ SOLN
25.0000 ug | INTRAMUSCULAR | Status: DC | PRN
  Administered 2020-02-19: 25 ug via INTRAVENOUS

## 2020-02-19 MED ORDER — SENNA 8.6 MG PO TABS
1.0000 | ORAL_TABLET | Freq: Two times a day (BID) | ORAL | Status: DC
  Administered 2020-02-19 – 2020-02-20 (×2): 8.6 mg via ORAL
  Filled 2020-02-19 (×2): qty 1

## 2020-02-19 MED ORDER — FENTANYL CITRATE (PF) 100 MCG/2ML IJ SOLN
INTRAMUSCULAR | Status: AC
  Filled 2020-02-19: qty 2

## 2020-02-19 MED ORDER — OXYCODONE HCL 5 MG/5ML PO SOLN: 5.0000 mg | Freq: Once | ORAL | Status: DC | PRN

## 2020-02-19 MED ORDER — MENTHOL 3 MG MT LOZG
1.0000 | LOZENGE | OROMUCOSAL | Status: DC | PRN
  Filled 2020-02-19: qty 9

## 2020-02-19 MED ORDER — ONDANSETRON HCL 4 MG/2ML IJ SOLN
INTRAMUSCULAR | Status: AC
  Filled 2020-02-19: qty 2

## 2020-02-19 MED ORDER — FAMOTIDINE 20 MG PO TABS: 20.0000 mg | ORAL_TABLET | Freq: Once | ORAL | Status: AC

## 2020-02-19 MED ORDER — PHENYLEPHRINE HCL-NACL 10-0.9 MG/250ML-% IV SOLN
INTRAVENOUS | Status: DC | PRN
Start: 2020-02-19 — End: 2020-02-19
  Administered 2020-02-19: 50 ug/min via INTRAVENOUS

## 2020-02-19 MED ORDER — ACETAMINOPHEN 10 MG/ML IV SOLN
INTRAVENOUS | Status: DC | PRN
  Administered 2020-02-19: 1000 mg via INTRAVENOUS

## 2020-02-19 MED ORDER — PHENOL 1.4 % MT LIQD
1.0000 | OROMUCOSAL | Status: DC | PRN
  Filled 2020-02-19: qty 177

## 2020-02-19 MED ORDER — FAMOTIDINE 20 MG PO TABS
ORAL_TABLET | ORAL | Status: AC
  Administered 2020-02-19: 20 mg via ORAL
  Filled 2020-02-19: qty 1

## 2020-02-19 MED ORDER — BISACODYL 10 MG RE SUPP
10.0000 mg | Freq: Every day | RECTAL | Status: DC | PRN
  Filled 2020-02-19: qty 1

## 2020-02-19 MED ORDER — OXYCODONE HCL 5 MG PO TABS: 5.0000 mg | ORAL_TABLET | Freq: Once | ORAL | Status: DC | PRN

## 2020-02-19 MED ORDER — ONDANSETRON HCL 4 MG/2ML IJ SOLN: 4.0000 mg | Freq: Four times a day (QID) | INTRAMUSCULAR | Status: DC | PRN

## 2020-02-19 MED ORDER — FLEET ENEMA 7-19 GM/118ML RE ENEM: 1.0000 | ENEMA | Freq: Once | RECTAL | Status: DC | PRN

## 2020-02-19 SURGICAL SUPPLY — 66 items
BIT DRILL 13 (BIT) ×2 IMPLANT
BLADE BOVIE TIP EXT 4 (BLADE) ×2 IMPLANT
BONE WEDGE CONERSTONE 5X14X11 (Bone Implant) ×2 IMPLANT
BONE WEDGE CONERSTONE 6X14X11 (Bone Implant) ×2 IMPLANT
BONE WEDGE CONERSTONE 7X14X11 (Bone Implant) ×2 IMPLANT
BUR NEURO DRILL SOFT 3.0X3.8M (BURR) ×2 IMPLANT
BUR SABER DIAMOND 3.0 (BURR) IMPLANT
CANISTER SUCT 1200ML W/VALVE (MISCELLANEOUS) ×4 IMPLANT
CHLORAPREP W/TINT 26 (MISCELLANEOUS) ×4 IMPLANT
COUNTER NEEDLE 20/40 LG (NEEDLE) ×2 IMPLANT
COVER LIGHT HANDLE STERIS (MISCELLANEOUS) ×4 IMPLANT
COVER WAND RF STERILE (DRAPES) ×2 IMPLANT
CUP MEDICINE 2OZ PLAST GRAD ST (MISCELLANEOUS) ×4 IMPLANT
DERMABOND ADVANCED (GAUZE/BANDAGES/DRESSINGS) ×1
DERMABOND ADVANCED .7 DNX12 (GAUZE/BANDAGES/DRESSINGS) ×1 IMPLANT
DRAPE C-ARM 42X72 X-RAY (DRAPES) ×4 IMPLANT
DRAPE INCISE IOBAN 66X45 STRL (DRAPES) ×2 IMPLANT
DRAPE MICROSCOPE SPINE 48X150 (DRAPES) ×2 IMPLANT
DRAPE SURG 17X11 SM STRL (DRAPES) ×8 IMPLANT
DRAPE THYROID T SHEET (DRAPES) ×2 IMPLANT
ELECT CAUTERY BLADE TIP 2.5 (TIP) ×2
ELECT EZSTD 165MM 6.5IN (MISCELLANEOUS) ×2
ELECTRODE CAUTERY BLDE TIP 2.5 (TIP) ×1 IMPLANT
ELECTRODE EZSTD 165MM 6.5IN (MISCELLANEOUS) ×1 IMPLANT
FEE INTRAOP MONITOR IMPULS NCS (MISCELLANEOUS) ×1 IMPLANT
GAUZE 4X4 16PLY RFD (DISPOSABLE) IMPLANT
GLOVE BIOGEL PI IND STRL 7.0 (GLOVE) ×1 IMPLANT
GLOVE BIOGEL PI INDICATOR 7.0 (GLOVE) ×1
GLOVE INDICATOR 8.0 STRL GRN (GLOVE) ×2 IMPLANT
GLOVE SURG SYN 7.0 (GLOVE) ×4 IMPLANT
GLOVE SURG SYN 8.0 (GLOVE) ×4 IMPLANT
GOWN STRL REUS W/ TWL LRG LVL3 (GOWN DISPOSABLE) ×1 IMPLANT
GOWN STRL REUS W/ TWL XL LVL3 (GOWN DISPOSABLE) ×2 IMPLANT
GOWN STRL REUS W/TWL LRG LVL3 (GOWN DISPOSABLE) ×1
GOWN STRL REUS W/TWL XL LVL3 (GOWN DISPOSABLE) ×4
GRADUATE 1200CC STRL 31836 (MISCELLANEOUS) ×2 IMPLANT
INTRAOP MONITOR FEE IMPULS NCS (MISCELLANEOUS) ×1
INTRAOP MONITOR FEE IMPULSE (MISCELLANEOUS) ×1
IV CATH ANGIO 14GX1.88 NO SAFE (IV SOLUTION) ×2 IMPLANT
KIT TURNOVER KIT A (KITS) ×2 IMPLANT
MARKER SKIN DUAL TIP RULER LAB (MISCELLANEOUS) ×2 IMPLANT
NEEDLE HYPO 22GX1.5 SAFETY (NEEDLE) ×2 IMPLANT
NEEDLE SPNL 22GX3.5 QUINCKE BK (NEEDLE) ×2 IMPLANT
NS IRRIG 1000ML POUR BTL (IV SOLUTION) ×2 IMPLANT
PACK LAMINECTOMY NEURO (CUSTOM PROCEDURE TRAY) ×2 IMPLANT
PAD ARMBOARD 7.5X6 YLW CONV (MISCELLANEOUS) ×2 IMPLANT
PIN CASPAR 14 (PIN) ×1 IMPLANT
PIN CASPAR 14MM (PIN) ×2
PLATE ZEVO 3LVL 51MM (Plate) ×2 IMPLANT
PUTTY DBF 1CC CORTICAL FIBERS (Putty) ×2 IMPLANT
SCREW 3.5 SELFDRILL 15MM VARI (Screw) ×16 IMPLANT
SPOGE SURGIFLO 8M (HEMOSTASIS) ×2
SPONGE KITTNER 5P (MISCELLANEOUS) ×6 IMPLANT
SPONGE SURGIFLO 8M (HEMOSTASIS) ×1 IMPLANT
SUT MNCRL 4-0 (SUTURE) ×1
SUT MNCRL 4-0 27XMFL (SUTURE) ×1
SUT SILK 2 0 (SUTURE)
SUT SILK 2-0 18XBRD TIE 12 (SUTURE) IMPLANT
SUT VICRYL 2-0 SH 8X27 (SUTURE) ×2 IMPLANT
SUT VICRYL 3-0 CR8 SH (SUTURE) ×4 IMPLANT
SUTURE MNCRL 4-0 27XMF (SUTURE) ×1 IMPLANT
SYR 30ML LL (SYRINGE) ×2 IMPLANT
TAPE CLOTH 3X10 WHT NS LF (GAUZE/BANDAGES/DRESSINGS) ×2 IMPLANT
TOWEL OR 17X26 4PK STRL BLUE (TOWEL DISPOSABLE) ×4 IMPLANT
TRAY FOLEY MTR SLVR 16FR STAT (SET/KITS/TRAYS/PACK) ×2 IMPLANT
TUBING CONNECTING 10 (TUBING) ×2 IMPLANT

## 2020-02-19 NOTE — Anesthesia Postprocedure Evaluation (Signed)
Anesthesia Post Note  Patient: Victoria Peterson  Procedure(s) Performed: ANTERIOR CERVICAL DECOMPRESSION/DISCECTOMY FUSION 3 LEVELS C3-6 (N/A )  Patient location during evaluation: PACU Anesthesia Type: General Level of consciousness: awake and alert and oriented Pain management: pain level controlled Vital Signs Assessment: post-procedure vital signs reviewed and stable Respiratory status: spontaneous breathing, nonlabored ventilation and respiratory function stable Cardiovascular status: blood pressure returned to baseline and stable Postop Assessment: no signs of nausea or vomiting Anesthetic complications: no   No complications documented.   Last Vitals:  Vitals:   02/19/20 1411 02/19/20 1426  BP: (!) 145/134 108/60  Pulse: 92 88  Resp: 16 13  Temp:    SpO2: 100% 100%    Last Pain:  Vitals:   02/19/20 1356  TempSrc:   PainSc: 0-No pain                 Ashonti Leandro
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
## 2020-02-19 NOTE — Plan of Care (Signed)
°  Problem: Health Behavior/Discharge Planning: Goal: Ability to manage health-related needs will improve Outcome: Progressing   Problem: Clinical Measurements: Goal: Ability to maintain clinical measurements within normal limits will improve Outcome: Progressing Goal: Will remain free from infection Outcome: Progressing Goal: Respiratory complications will improve Outcome: Progressing Goal: Cardiovascular complication will be avoided Outcome: Progressing

## 2020-02-19 NOTE — Transfer of Care (Signed)
Immediate Anesthesia Transfer of Care Note  Patient: Victoria Peterson  Procedure(s) Performed: ANTERIOR CERVICAL DECOMPRESSION/DISCECTOMY FUSION 3 LEVELS C3-6 (N/A )  Patient Location: PACU  Anesthesia Type:General  Level of Consciousness: awake  Airway & Oxygen Therapy: Patient connected to face mask oxygen  Post-op Assessment: Post -op Vital signs reviewed and stable  Post vital signs: stable  Last Vitals:  Vitals Value Taken Time  BP 158/81   Temp    Pulse 74 02/19/20 1225  Resp 16 02/19/20 1225  SpO2 100 % 02/19/20 1225  Vitals shown include unvalidated device data.  Last Pain:  Vitals:   02/19/20 0625  TempSrc: Temporal  PainSc: 2          Complications: No complications documented.

## 2020-02-19 NOTE — Op Note (Signed)
Operative Note  02/19/2020  PRE-OP DIAGNOSIS:  Cervical Myelopathy * Herniated nucleus pulposus, cervical [M50.20]   POST-OP DIAGNOSIS: Post-Op Diagnosis Codes:  Cervical Myelopathy * Herniated nucleus pulposus, cervical [M50.20]   Procedure(s):  1. ARTHRODESIS, ANTERIOR INTERBODY, INCLUDING DISC SPACE PREPARATION, DISCECTOMY, OSTEOPHYTECTOMY AND DECOMPRESSION OF SPINAL CORD AND/ORNERVE ROOTS; CERVICAL BELOW C2-- C5/6 ACDF  2. ARTHRODESIS ANTERIOR INTERBODY W/DISCECTOMY ADDITIONAL LEVEL C4/5  3. ANTERIOR INSTRUMENTATION; 2 TO 3 VERTEBRAL SEGMENTS (LIST IN ADDITION TO PRIMARY PROCEDURE)  4.ARTHRODESIS ANTERIOR INTERBODY W/DISCECTOMY ADDITIONAL LEVEL C3/4 5. ALLOGRAFT, STRUCTURAL, FOR SPINE SURGERY ONLY (LIST IN ADDITION TO PRIMARY PROCEDURE)   SURGEON: Surgeon(s) and Role:  * Malen Gauze, MD - Primary  Lonell Face, PA, Asisstant   ANESTHESIA: General   OPERATIVE FINDINGS: Stenosis at C3/4, C4/5, and C5/6   OPERATIVE REPORT:  Indications  Victoria Spencepresented to our clinic on6/1with ongoing weakness and numbness. She had a MRI that showed stenosisonat C3/4, C4/5, and C5/6. She had cord signal changes and signs/symptoms consistent with myelopathy.Given this, we recommended an anterior cervical decompression and fusion to relieve the pressure on the spinal cord and allow for healing. The risks of hematoma, infection, poor bone healing and failure of fusion, cord injury, weakness, numbness, neck pain, stroke, and death were discussed in detail. All questions were answered and the patient elected to proceed with the surgery.   Procedure  After obtaining informed consent, the patient was taken to the Operating Room where general anesthesia was induced and the patient intubated. Vascular access was obtained. Decadron and antibiotics were administered. Neuromonitoing electrodes were placed for MEP and SSEP. The head was slightly extended and imaging used to identify a skin  crease overlying the C5 vertebral body. Appropriate padding was performed.   The patient was prepped and draped in the usual sterile fashion and a timeout was performed per protocol. Local anesthesia was instilled with epinephrine along the planned incision site. A transverse cervical incision was performed on the left in a skin crease. The incision was carried to the level of the platysma and then cautery was used to incise the muscle. Blunt dissection was used to expand the plane and the dissection was carried deep medial to the SCM and carotid sheath being careful to identify the trachea and esophagus medially. The prevertebral fascia was identified and this was bluntly dissected to expose the disc spaces. A needle was placed in the disc space and x-ray confirmed the C3/4disc level.   Next, cautery was used to undermine the longus colli muscles bilaterally and identify the C3/4 disc space. Caspar pins were placed at C4 and C3 and a retractor system placed under the muscles to complete the exposure. Next, a combination of curettes and Kerrison rongeurs were used to remove the anterior osteophyte at C3/4 and then the disc material. A 81m matchstick was used to shave the endplates of the adjacent bodies. A trial spacer was used to size the graft and then hemostasis obtained.   The microscope was brought into the field for the remainder of the surgery. The matchsitick drill bit was used to remove the osteophyte/disc complex deep to the level of the PLL at C3/4. There was significant disc protrusion at this level that was removed with rongeurs to decompress the spinal cord. The PLL was entered with a hook and then the PLL was removed along with remaining disc material to decompress centrally and then out into bilateral neuro foramen. A blunt probe was used to confirm no residual stenosis laterally and  a curette used to ensure no posterior osteophyte remained. Floseal was used for hemostasis. Allograft was  placed, 928m in height and placed slightly recessed to the anterior edge of vertebral body to promote arthrodesis.   The retractors were moved to the C4-6 disc space. Next, cautery was used to undermine the longus colli muscles bilaterally and identify the C4/5 and C5/6 disc spaces. The Caspar pin was removed from C3 and bone wax placed. Caspar pins were placed at C4 and C6 and a retractor system placed under the muscles to complete the exposure. Next, a combination of curettes and Kerrison rongeurs were used to remove the anterior osteophyte at C4/5 and C5/6 and then the disc material. A 333mmatchstick was used to shave the endplates of the adjacent bodies. A trial spacer was used to size the graft and then hemostasis obtained. Both disc spaces were seen to be narrowed and was widened with the drill.  The matchsitick drill bit was used to remove the osteophyte/disc complex deep to the level of the PLL at C4/5 and C5/6. There was significant disc protrusions as well as osteophytes that were removed.  The PLL was entered with a hook and then the PLL was removed along with remaining disc material to decompress centrally and then out into bilateral neuro foramen. A blunt probe was used to confirm no residual stenosis laterally and a curette used to ensure no posterior osteophyte remained. Floseal was used for hemostasis. Allograft was placed, 28m69mn height at C4/5 and 5mm30m height at C5/6 and placed slightly recessed to the anterior edge of vertebral body to promote arthrodesis. All monitoring remained stable at this point.    The caspar pins were removed and bone wax placed. The remainder of the osteophytes were removed to allow for plating. Next, a 51mm65mee level plate was found to be the adequate size and was placed in the midline and secured with two15 mmscrews at each bone level. Xray was obtained confirming good graft placement and adequate depth of screws. The retractors were removed. The wound was  irrigated copiously and hemostasis obtained. The platysma was closed with 2-0 vicryl suture. The dermis was closed with 3-0 Vicryl and Dermabond was placed on the skin.   The patient had general anesthesia reversed and was extubated following the procedure. She awoke following commands with symmetric movement. She was taken to the PACU where she continued recovery and then the ward.   ESTIMATED BLOOD LOSS: 100 cc   SPECIMENS: None   IMPLANT PUTTY DBF 1CC CORTICAL FIBERS - [id number] Item: PUTTY DBF 1CC CORTICAL FIBERS Serial no.: [id number]/Cat no.: [id number] name: PUTTY DBF 1CC CORTICAL FIBERS - [id number]: N/A Area: Spine Cervical  Manufacturer: [employer] [place] I[place]Date of Manufacture:    Action: Implanted Number Used: 1   Device Identifier:  Device Identifier Type:    BONE WEDGE CONERSTONE [id number] Item: BONE WEDGE CONERSTONE [id number] no.: [id number]/Cat no.: [id number] name: BONE WEDGECorena [id number]: N/A Area: Spine Cervical  Manufacturer: [employer] SOF[employer]oni [employer] of Manufacture:    Action: Implanted Number Used: 1   Device Identifier:  Device Identifier Type:    BONE WEDGE CONERSTONE [id number] Item: BONE WEDGE CONERSTONE [id number] no.: [id number]/Cat no.: [id number] name: BONE WEDGECorena [id number]: N/A Area: Spine Cervical  Manufacturer: [place] of Manufacture:    Action:  Implanted Number Used: 1   Device Identifier:  Device Identifier Type:    BONE WEDGE CONERSTONE [id number] - [id number]  Inventory Item: BONE WEDGE CONERSTONE [id number] Serial no.: [id number] Model/Cat no.: [id number]  Implant name: BONE Corena Herter [id number] - [id number] Laterality: N/A Area: Spine Cervical  Manufacturer: MEDTRON[employer] Roni Bread Date of Manufacture:    Action: Implanted Number Used: 1   Device Identifier:  Device  Identifier Type:    SCREW 3.5 SELFDRILL 15MM VARI - [id number]  Inventory Item: SCREW 3.5 SELFDRILL 15MM VARI Serial no.:  Model/Cat no.: [id number]  Implant name: SCREW 3.5 SELFDRILL 15MM VARI - [id number] Laterality: N/A Area: Spine Cervical  Manufacturer: [employer] Roni Bread Date of Manufacture:    Action: Implanted Number Used: 8   Device Identifier:  Device Identifier Type:    PLATE ZEVO 3LVL 30SP - [id number]  Inventory Item: PLATE ZEVO 3LVL 23RA Serial no.:  Model/Cat no.: [id number]  Implant name: PLATE ZEVO 3LVL 07MA - [id number] Laterality: N/A Area: Spine Cervical  Manufacturer: [employer] Roni Bread Date of Manufacture:    Action: Implanted Number Used: 1   Device Identifier:  Device Identifier Type:        ATTESTATION:  I performed the procedure in its entirety with the assistance of Lonell Face, physician assistant   Victoria Peterson  [phone]

## 2020-02-19 NOTE — Anesthesia Procedure Notes (Addendum)
Procedure Name: Intubation Date/Time: 02/19/2020 7:31 AM Performed by: Justus Memory, CRNA Pre-anesthesia Checklist: Patient identified, Patient being monitored, Timeout performed, Emergency Drugs available and Suction available Patient Re-evaluated:Patient Re-evaluated prior to induction Oxygen Delivery Method: Circle system utilized Preoxygenation: Pre-oxygenation with 100% oxygen Induction Type: IV induction Ventilation: Mask ventilation without difficulty Laryngoscope Size: 3 and Glidescope Grade View: Grade I Tube type: Oral Tube size: 7.0 mm Number of attempts: 1 Airway Equipment and Method: Stylet and Video-laryngoscopy Placement Confirmation: ETT inserted through vocal cords under direct vision,  positive ETCO2 and breath sounds checked- equal and bilateral Secured at: 21 cm Tube secured with: Tape Dental Injury: Teeth and Oropharynx as per pre-operative assessment

## 2020-02-19 NOTE — Progress Notes (Signed)
Procedure: ACDF C3-6 Procedure date: 02/19/2020 Diagnosis: Cervical myelopathy   History: Victoria Peterson is s/p ACDF C3-6 POD0: Tolerated procedure well. Evaluated in post op recovery still disoriented from anesthesia but able to answer questions and obey commands.   Physical Exam: Vitals:   02/19/20 2036 02/19/20 2122  BP: 132/82 132/88  Pulse: (!) 109 (!) 102  Resp: 18 16  Temp: 97.9 F (36.6 C) 97.9 F (36.6 C)  SpO2: 98% 98%    General: Lying in bed. Awakens to voice.  Strength:5/5 throughout Sensation: intact and symmetric throughout  Skin: incision to anterior neck without evidence of complication. No hematoma noted.   Data:  Recent Labs  Lab 02/15/20 1036  NA 140  K 4.1  CL 108  CO2 26  BUN 14  CREATININE 0.83  GLUCOSE 101*  CALCIUM 8.9   No results for input(s): AST, ALT, ALKPHOS in the last 168 hours.  Invalid input(s): TBILI   Recent Labs  Lab 02/15/20 1036  WBC 4.0  HGB 11.9*  HCT 34.9*  PLT 265   Recent Labs  Lab 02/15/20 1036  APTT 30  INR 1.0          Assessment/Plan:  Victoria Peterson is POD0 s/p ACDF C3-6. Will continue to monitor and admit overnight.   - mobilize - pain control - PTOT - Brace at all times - Imaging   Lonell Face, NP Department of Neurosurgery

## 2020-02-19 NOTE — Interval H&P Note (Signed)
History and Physical Interval Note:  02/19/2020 6:46 AM  Victoria Peterson  has presented today for surgery, with the diagnosis of cervical myelopathy g95.9.  The various methods of treatment have been discussed with the patient and family. After consideration of risks, benefits and other options for treatment, the patient has consented to  Procedure(s): ANTERIOR CERVICAL DECOMPRESSION/DISCECTOMY FUSION 3 LEVELS C3-6 (N/A) as a surgical intervention.  The patient's history has been reviewed, patient examined, no change in status, stable for surgery.  I have reviewed the patient's chart and labs.  Questions were answered to the patient's satisfaction.     Deetta Perla
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
## 2020-02-19 NOTE — Anesthesia Procedure Notes (Signed)
Arterial Line Insertion Start/End7/19/2021 7:55 AM, 02/19/2020 8:05 AM Performed by: Emmie Niemann, MD, anesthesiologist  Patient location: Pre-op. Preanesthetic checklist: patient identified, IV checked, site marked, risks and benefits discussed, surgical consent, monitors and equipment checked, pre-op evaluation, timeout performed and anesthesia consent Lidocaine 1% used for infiltration radial was placed Catheter size: 20 Fr Hand hygiene performed  and maximum sterile barriers used   Attempts: 1 Procedure performed using ultrasound guided technique. Ultrasound Notes:anatomy identified, needle tip was noted to be adjacent to the nerve/plexus identified and no ultrasound evidence of intravascular and/or intraneural injection Following insertion, dressing applied and Biopatch. Post procedure assessment: normal and unchanged

## 2020-02-20 ENCOUNTER — Encounter: Payer: Self-pay | Admitting: Neurosurgery

## 2020-02-20 DIAGNOSIS — Z96652 Presence of left artificial knee joint: Secondary | ICD-10-CM | POA: Diagnosis not present

## 2020-02-20 DIAGNOSIS — M5000 Cervical disc disorder with myelopathy, unspecified cervical region: Secondary | ICD-10-CM | POA: Diagnosis not present

## 2020-02-20 DIAGNOSIS — R200 Anesthesia of skin: Secondary | ICD-10-CM | POA: Diagnosis not present

## 2020-02-20 DIAGNOSIS — Z7982 Long term (current) use of aspirin: Secondary | ICD-10-CM | POA: Diagnosis not present

## 2020-02-20 DIAGNOSIS — R531 Weakness: Secondary | ICD-10-CM | POA: Diagnosis not present

## 2020-02-20 DIAGNOSIS — R202 Paresthesia of skin: Secondary | ICD-10-CM | POA: Diagnosis not present

## 2020-02-20 DIAGNOSIS — Z79899 Other long term (current) drug therapy: Secondary | ICD-10-CM | POA: Diagnosis not present

## 2020-02-20 DIAGNOSIS — M5020 Other cervical disc displacement, unspecified cervical region: Secondary | ICD-10-CM | POA: Diagnosis not present

## 2020-02-20 MED ORDER — OXYCODONE HCL 5 MG PO TABS: 5.0000 mg | ORAL_TABLET | ORAL | 0 refills | Status: AC | PRN

## 2020-02-20 MED ORDER — SENNA 8.6 MG PO TABS: 1.0000 | ORAL_TABLET | Freq: Two times a day (BID) | ORAL | 0 refills | Status: DC

## 2020-02-20 MED ORDER — METHOCARBAMOL 500 MG PO TABS: 500.0000 mg | ORAL_TABLET | Freq: Four times a day (QID) | ORAL | 0 refills | Status: AC | PRN

## 2020-02-20 MED ORDER — POLYETHYLENE GLYCOL 3350 17 G PO PACK: 17.0000 g | PACK | Freq: Every day | ORAL | 0 refills | Status: DC | PRN

## 2020-02-20 NOTE — Progress Notes (Signed)
Discharge Note: Reviewed discharge instructions. Pt verbalized understanding. Neck brace in place. Ted hose on bilateral lower extremity. Obtained vitals. Wheeled out by staff. Transported to home by daughter via private vehicle.

## 2020-02-20 NOTE — Discharge Instructions (Signed)
PLEASE HOLD YOUR ASPIRIN FOR 7 DAYS AFTER SURGERY.   Your surgeon has performed an operation on your cervical spine (neck) to relieve pressure on the spinal cord and/or nerves. This involved making an incision in the front of your neck and removing one or more of the discs that support your spine. Next, a small piece of bone, a titanium plate, and screws were used to fuse two or more of the vertebrae (bones) together.  The following are instructions to help in your recovery once you have been discharged from the hospital. Even if you feel well, it is important that you follow these activity guidelines. If you do not let your neck heal properly from the surgery, you can increase the chance of return of your symptoms and other complications.  * Do not take anti-inflammatory medications for 3 months after surgery (naproxen [Aleve], ibuprofen [Advil, Motrin], etc.). These medications can prevent your bones from healing properly.  Activity    No bending, lifting, or twisting ("BLT"). Avoid lifting objects heavier than 10 pounds (gallon milk jug).  Where possible, avoid household activities that involve lifting, bending, reaching, pushing, or pulling such as laundry, vacuuming, grocery shopping, and childcare. Try to arrange for help from friends and family for these activities while your back heals.  Increase physical activity slowly as tolerated.  Taking short walks is encouraged, but avoid strenuous exercise. Do not jog, run, bicycle, lift weights, or participate in any other exercises unless specifically allowed by your doctor.  Talk to your doctor before resuming sexual activity.  You should not drive until cleared by your doctor.  Until released by your doctor, you should not return to work or school.  You should rest at home and let your body heal.   You may shower three days after your surgery.  After showering, lightly dab your incision dry. Do not take a tub bath or go swimming until  approved by your doctor at your follow-up appointment.  If your doctor ordered a cervical collar (neck brace) for you, you should wear it whenever you are out of bed. You may remove it when lying down or sleeping, but you should wear it at all other times. Not all neck surgeries require a cervical collar.  If you smoke, we strongly recommend that you quit.  Smoking has been proven to interfere with normal bone healing and will dramatically reduce the success rate of your surgery. Please contact [facility] (800-QUIT-NOW) and use the resources at www.[facility].com for assistance in stopping smoking.  Surgical Incision   If you have a dressing on your incision, you may remove it two days after your surgery. Keep your incision area clean and dry.  If you have staples or stitches on your incision, you should have a follow up scheduled for removal. If you do not have staples or stitches, you will have steri-strips (small pieces of surgical tape) or Dermabond glue. The steri-strips/glue should begin to peel away within about a week (it is fine if the steri-strips fall off before then). If the strips are still in place one week after your surgery, you may gently remove them.  Diet           You may return to your usual diet. However, you may experience discomfort when swallowing in the first month after your surgery. This is normal. You may find that softer foods are more comfortable for you to swallow. Be sure to stay hydrated.  When to Contact us  You may experience  pain in your neck and/or pain between your shoulder blades. This is normal and should improve in the next few weeks with the help of pain medication, muscle relaxers, and rest. Some patients report that a warm compress on the back of the neck or between the shoulder blades helps.  However, should you experience any of the following, contact us immediately: . New numbness or weakness . Pain that is progressively getting worse, and is not  relieved by your pain medication, muscle relaxers, rest, and warm compresses . Bleeding, redness, swelling, pain, or drainage from surgical incision . Chills or flu-like symptoms . Fever greater than 101.0 F (38.3 C) . Inability to eat, drink fluids, or take medications . Problems with bowel or bladder functions . Difficulty breathing or shortness of breath . Warmth, tenderness, or swelling in your calf Contact Information . During office hours (Monday-Friday 9 am to 5 pm), please call your physician at [phone] and ask for Berdine Addison . After hours and weekends, please call [phone] and an answering service will put you in touch with either Dr. Lacinda Axon or Dr. Izora Ribas.  . For a life-threatening emergency, call 911
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
## 2020-02-20 NOTE — Discharge Summary (Signed)
Procedure: ACDF C3-6 Procedure date: 02/19/2020 Diagnosis: Cervical myelopathy   History: Victoria Peterson is s/p ACDF C3-6 POD1: Victoria Peterson was seen this morning, she is doing quite well. Has urinated, postoperative films have been reviewed, and she is tolerating some soft food without significant dysphagia.  POD0: Tolerated procedure well. Evaluated in post op recovery still disoriented from anesthesia but able to answer questions and obey commands.   Physical Exam: Vitals:   02/20/20 0723 02/20/20 1130  BP: 114/61 (!) 99/52  Pulse: 82 90  Resp: 16 16  Temp: 98.2 F (36.8 C) 98 F (36.7 C)  SpO2: 98% 99%    General: Alert, oriented, pleasant. Strength:5/5 throughout Sensation: intact and symmetric throughout  Skin: incision to anterior neck without evidence of complication. No hematoma noted.   Data:  Recent Labs  Lab 02/15/20 1036  NA 140  K 4.1  CL 108  CO2 26  BUN 14  CREATININE 0.83  GLUCOSE 101*  CALCIUM 8.9   No results for input(s): AST, ALT, ALKPHOS in the last 168 hours.  Invalid input(s): TBILI   Recent Labs  Lab 02/15/20 1036  WBC 4.0  HGB 11.9*  HCT 34.9*  PLT 265   Recent Labs  Lab 02/15/20 1036  APTT 30  INR 1.0          Assessment/Plan:  Victoria Peterson is POD1 s/p ACDF C3-6. She is doing quite well and has cleared PT, tolerated some PO, and is voiding.  She is cleared today to return home. She will follow up in two weeks in clinic.     Lonell Face, NP Department of Neurosurgery

## 2020-02-22 ENCOUNTER — Encounter: Payer: Self-pay | Admitting: Neurosurgery

## 2020-03-11 ENCOUNTER — Ambulatory Visit: Payer: Medicare Other

## 2020-03-25 ENCOUNTER — Ambulatory Visit (INDEPENDENT_AMBULATORY_CARE_PROVIDER_SITE_OTHER): Payer: Medicare Other

## 2020-03-25 VITALS — Ht 60.0 in | Wt 163.0 lb

## 2020-03-25 DIAGNOSIS — Z0000 Encounter for general adult medical examination without abnormal findings: Secondary | ICD-10-CM | POA: Diagnosis not present
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
## 2020-03-25 NOTE — Patient Instructions (Signed)
Victoria Peterson , Thank you for taking time to come for your Medicare Wellness Visit. I appreciate your ongoing commitment to your health goals. Please review the following plan we discussed and let me know if I can assist you in the future.   Screening recommendations/referrals: Colonoscopy: not required Mammogram: not required Bone Density: completed 10/03/2013 Recommended yearly ophthalmology/optometry visit for glaucoma screening and checkup Recommended yearly dental visit for hygiene and checkup  Vaccinations: Influenza vaccine: decline Pneumococcal vaccine: completed 06/02/2010 Tdap vaccine: due Shingles vaccine: discussed   Covid-19:decline  Advanced directives: Please bring a copy of your POA (Power of Attorney) and/or Living Will to your next appointment.   Conditions/risks identified: none  Next appointment: Follow up in one year for your annual wellness visit    Preventive Care 65 Years and Older, Female Preventive care refers to lifestyle choices and visits with your health care provider that can promote health and wellness. What does preventive care include?  A yearly physical exam. This is also called an annual well check.  Dental exams once or twice a year.  Routine eye exams. Ask your health care provider how often you should have your eyes checked.  Personal lifestyle choices, including:  Daily care of your teeth and gums.  Regular physical activity.  Eating a healthy diet.  Avoiding tobacco and drug use.  Limiting alcohol use.  Practicing safe sex.  Taking low-dose aspirin every day.  Taking vitamin and mineral supplements as recommended by your health care provider. What happens during an annual well check? The services and screenings done by your health care provider during your annual well check will depend on your age, overall health, lifestyle risk factors, and family history of disease. Counseling  Your health care provider may ask you questions  about your:  Alcohol use.  Tobacco use.  Drug use.  Emotional well-being.  Home and relationship well-being.  Sexual activity.  Eating habits.  History of falls.  Memory and ability to understand (cognition).  Work and work Statistician.  Reproductive health. Screening  You may have the following tests or measurements:  Height, weight, and BMI.  Blood pressure.  Lipid and cholesterol levels. These may be checked every 5 years, or more frequently if you are over 43 years old.  Skin check.  Lung cancer screening. You may have this screening every year starting at age 43 if you have a 30-pack-year history of smoking and currently smoke or have quit within the past 15 years.  Fecal occult blood test (FOBT) of the stool. You may have this test every year starting at age 25.  Flexible sigmoidoscopy or colonoscopy. You may have a sigmoidoscopy every 5 years or a colonoscopy every 10 years starting at age 96.  Hepatitis C blood test.  Hepatitis B blood test.  Sexually transmitted disease (STD) testing.  Diabetes screening. This is done by checking your blood sugar (glucose) after you have not eaten for a while (fasting). You may have this done every 1-3 years.  Bone density scan. This is done to screen for osteoporosis. You may have this done starting at age 51.  Mammogram. This may be done every 1-2 years. Talk to your health care provider about how often you should have regular mammograms. Talk with your health care provider about your test results, treatment options, and if necessary, the need for more tests. Vaccines  Your health care provider may recommend certain vaccines, such as:  Influenza vaccine. This is recommended every year.  Tetanus, diphtheria,  and acellular pertussis (Tdap, Td) vaccine. You may need a Td booster every 10 years.  Zoster vaccine. You may need this after age 65.  Pneumococcal 13-valent conjugate (PCV13) vaccine. One dose is  recommended after age 39.  Pneumococcal polysaccharide (PPSV23) vaccine. One dose is recommended after age 33. Talk to your health care provider about which screenings and vaccines you need and how often you need them. This information is not intended to replace advice given to you by your health care provider. Make sure you discuss any questions you have with your health care provider. Document Released: 08/16/2015 Document Revised: 04/08/2016 Document Reviewed: 05/21/2015 Elsevier Interactive Patient Education  2017 [place] Prevention in the Home Falls can cause injuries. They can happen to people of all ages. There are many things you can do to make your home safe and to help prevent falls. What can I do on the outside of my home?  Regularly fix the edges of walkways and driveways and fix any cracks.  Remove anything that might make you trip as you walk through a door, such as a raised step or threshold.  Trim any bushes or trees on the path to your home.  Use bright outdoor lighting.  Clear any walking paths of anything that might make someone trip, such as rocks or tools.  Regularly check to see if handrails are loose or broken. Make sure that both sides of any steps have handrails.  Any raised decks and porches should have guardrails on the edges.  Have any leaves, snow, or ice cleared regularly.  Use sand or salt on walking paths during winter.  Clean up any spills in your garage right away. This includes oil or grease spills. What can I do in the bathroom?  Use night lights.  Install grab bars by the toilet and in the tub and shower. Do not use towel bars as grab bars.  Use non-skid mats or decals in the tub or shower.  If you need to sit down in the shower, use a plastic, non-slip stool.  Keep the floor dry. Clean up any water that spills on the floor as soon as it happens.  Remove soap buildup in the tub or shower regularly.  Attach bath mats  securely with double-sided non-slip rug tape.  Do not have throw rugs and other things on the floor that can make you trip. What can I do in the bedroom?  Use night lights.  Make sure that you have a light by your bed that is easy to reach.  Do not use any sheets or blankets that are too big for your bed. They should not hang down onto the floor.  Have a firm chair that has side arms. You can use this for support while you get dressed.  Do not have throw rugs and other things on the floor that can make you trip. What can I do in the kitchen?  Clean up any spills right away.  Avoid walking on wet floors.  Keep items that you use a lot in easy-to-reach places.  If you need to reach something above you, use a strong step stool that has a grab bar.  Keep electrical cords out of the way.  Do not use floor polish or wax that makes floors slippery. If you must use wax, use non-skid floor wax.  Do not have throw rugs and other things on the floor that can make you trip. What can I do with my  stairs?  Do not leave any items on the stairs.  Make sure that there are handrails on both sides of the stairs and use them. Fix handrails that are broken or loose. Make sure that handrails are as long as the stairways.  Check any carpeting to make sure that it is firmly attached to the stairs. Fix any carpet that is loose or worn.  Avoid having throw rugs at the top or bottom of the stairs. If you do have throw rugs, attach them to the floor with carpet tape.  Make sure that you have a light switch at the top of the stairs and the bottom of the stairs. If you do not have them, ask someone to add them for you. What else can I do to help prevent falls?  Wear shoes that:  Do not have high heels.  Have rubber bottoms.  Are comfortable and fit you well.  Are closed at the toe. Do not wear sandals.  If you use a stepladder:  Make sure that it is fully opened. Do not climb a closed  stepladder.  Make sure that both sides of the stepladder are locked into place.  Ask someone to hold it for you, if possible.  Clearly mark and make sure that you can see:  Any grab bars or handrails.  First and last steps.  Where the edge of each step is.  Use tools that help you move around (mobility aids) if they are needed. These include:  Canes.  Walkers.  Scooters.  Crutches.  Turn on the lights when you go into a dark area. Replace any light bulbs as soon as they burn out.  Set up your furniture so you have a clear path. Avoid moving your furniture around.  If any of your floors are uneven, fix them.  If there are any pets around you, be aware of where they are.  Review your medicines with your doctor. Some medicines can make you feel dizzy. This can increase your chance of falling. Ask your doctor what other things that you can do to help prevent falls. This information is not intended to replace advice given to you by your health care provider. Make sure you discuss any questions you have with your health care provider. Document Released: 05/16/2009 Document Revised: 12/26/2015 Document Reviewed: 08/24/2014 Elsevier Interactive Patient Education  2017 [employer].

## 2020-03-26 DIAGNOSIS — Z981 Arthrodesis status: Secondary | ICD-10-CM | POA: Diagnosis not present

## 2020-03-26 DIAGNOSIS — M4322 Fusion of spine, cervical region: Secondary | ICD-10-CM | POA: Diagnosis not present

## 2020-04-17 DIAGNOSIS — M5416 Radiculopathy, lumbar region: Secondary | ICD-10-CM | POA: Diagnosis not present

## 2020-04-30 DIAGNOSIS — M5416 Radiculopathy, lumbar region: Secondary | ICD-10-CM | POA: Diagnosis not present

## 2020-05-07 DIAGNOSIS — M48061 Spinal stenosis, lumbar region without neurogenic claudication: Secondary | ICD-10-CM | POA: Diagnosis not present

## 2020-05-07 DIAGNOSIS — M4310 Spondylolisthesis, site unspecified: Secondary | ICD-10-CM | POA: Diagnosis not present

## 2020-05-07 DIAGNOSIS — M5136 Other intervertebral disc degeneration, lumbar region: Secondary | ICD-10-CM | POA: Diagnosis not present

## 2020-05-21 DIAGNOSIS — M4322 Fusion of spine, cervical region: Secondary | ICD-10-CM | POA: Diagnosis not present

## 2020-05-21 DIAGNOSIS — Z981 Arthrodesis status: Secondary | ICD-10-CM | POA: Diagnosis not present

## 2020-06-05 DIAGNOSIS — M5000 Cervical disc disorder with myelopathy, unspecified cervical region: Secondary | ICD-10-CM | POA: Diagnosis not present

## 2020-06-05 DIAGNOSIS — M5416 Radiculopathy, lumbar region: Secondary | ICD-10-CM | POA: Diagnosis not present

## 2020-06-05 DIAGNOSIS — G5603 Carpal tunnel syndrome, bilateral upper limbs: Secondary | ICD-10-CM | POA: Diagnosis not present

## 2020-06-18 ENCOUNTER — Encounter: Payer: Self-pay | Admitting: Family Medicine

## 2020-06-18 ENCOUNTER — Other Ambulatory Visit: Payer: Self-pay

## 2020-06-18 ENCOUNTER — Ambulatory Visit (INDEPENDENT_AMBULATORY_CARE_PROVIDER_SITE_OTHER): Payer: Medicare Other | Admitting: Family Medicine

## 2020-06-18 DIAGNOSIS — R350 Frequency of micturition: Secondary | ICD-10-CM | POA: Diagnosis not present

## 2020-06-18 DIAGNOSIS — E559 Vitamin D deficiency, unspecified: Secondary | ICD-10-CM | POA: Diagnosis not present

## 2020-06-18 DIAGNOSIS — Z1159 Encounter for screening for other viral diseases: Secondary | ICD-10-CM | POA: Diagnosis not present

## 2020-06-18 DIAGNOSIS — N3001 Acute cystitis with hematuria: Secondary | ICD-10-CM

## 2020-06-18 DIAGNOSIS — R7303 Prediabetes: Secondary | ICD-10-CM

## 2020-06-18 DIAGNOSIS — Z932 Ileostomy status: Secondary | ICD-10-CM

## 2020-06-18 DIAGNOSIS — Z136 Encounter for screening for cardiovascular disorders: Secondary | ICD-10-CM | POA: Diagnosis not present

## 2020-06-18 DIAGNOSIS — R8281 Pyuria: Secondary | ICD-10-CM

## 2020-06-18 MED ORDER — CIPROFLOXACIN HCL 500 MG PO TABS: 500.0000 mg | ORAL_TABLET | Freq: Two times a day (BID) | ORAL | 0 refills | Status: DC

## 2020-06-18 MED ORDER — METRONIDAZOLE 500 MG PO TABS: 500.0000 mg | ORAL_TABLET | Freq: Two times a day (BID) | ORAL | 0 refills | Status: DC

## 2020-06-18 NOTE — Progress Notes (Signed)
There were no vitals taken for this visit.   Subjective:    Patient ID: Victoria Peterson, female    DOB: May 18, 1941, 79 y.o.   MRN: [id number]  HPI: Victoria Peterson is a 79 y.o. female  Chief Complaint  Patient presents with  . Urinary Frequency    flank pain and back pain  . area of concern    area around pouch is irritated   Impaired Fasting Glucose HbA1C:  Lab Results  Component Value Date   HGBA1C 5.5 06/18/2020   Duration of elevated blood sugar: chronic Polydipsia: no Polyuria: yes Weight change: no Visual disturbance: no Glucose Monitoring: no Diabetic Education: Not Completed Family history of diabetes: yes  URINARY SYMPTOMS Duration: a few weeks Dysuria: no Urinary frequency: yes Urgency: yes Small volume voids: no Symptom severity: moderate Urinary incontinence: yes Foul odor: no Hematuria: no Abdominal pain: no Back pain: yes Suprapubic pain/pressure: no Flank pain: yes Fever:  no Vomiting: no Relief with cranberry juice: no Relief with pyridium: no Status:stable Previous urinary tract infection: yes Recurrent urinary tract infection: no History of sexually transmitted disease: no Vaginal discharge: no Treatments attempted: cranberry and increasing fluids   Has been having pain and irritation in her ileo-pouch. Concern for infection as has happened before.   Feels like her pouch may be irritated for the past couple of days, has been red and swollen. No fevers. She notes that she had 9 grain bread and that seems like it made it worse.   Relevant past medical, surgical, family and social history reviewed and updated as indicated. Interim medical history since our last visit reviewed. Allergies and medications reviewed and updated.  Review of Systems  Constitutional: Negative.   HENT: Negative.   Respiratory: Negative.   Cardiovascular: Negative.   Gastrointestinal: Positive for abdominal pain and diarrhea. Negative for abdominal  distention, anal bleeding, blood in stool, constipation, nausea, rectal pain and vomiting.  Psychiatric/Behavioral: Negative.     Per HPI unless specifically indicated above     Objective:    There were no vitals taken for this visit.  Wt Readings from Last 3 Encounters:  03/25/20 163 lb (73.9 kg)  02/19/20 169 lb 12.1 oz (77 kg)  02/12/20 170 lb (77.1 kg)    Physical Exam Vitals and nursing note reviewed.  Constitutional:      General: She is not in acute distress.    Appearance: Normal appearance. She is not ill-appearing, toxic-appearing or diaphoretic.  HENT:     Head: Normocephalic and atraumatic.     Right Ear: External ear normal.     Left Ear: External ear normal.     Nose: Nose normal.     Mouth/Throat:     Mouth: Mucous membranes are moist.     Pharynx: Oropharynx is clear.  Eyes:     General: No scleral icterus.       Right eye: No discharge.        Left eye: No discharge.     Extraocular Movements: Extraocular movements intact.     Conjunctiva/sclera: Conjunctivae normal.     Pupils: Pupils are equal, round, and reactive to light.  Cardiovascular:     Rate and Rhythm: Normal rate and regular rhythm.     Pulses: Normal pulses.     Heart sounds: Normal heart sounds. No murmur heard.  No friction rub. No gallop.   Pulmonary:     Effort: Pulmonary effort is normal. No respiratory distress.  Breath sounds: Normal breath sounds. No stridor. No wheezing, rhonchi or rales.  Chest:     Chest wall: No tenderness.  Musculoskeletal:        General: Normal range of motion.     Cervical back: Normal range of motion and neck supple.  Skin:    General: Skin is warm and dry.     Capillary Refill: Capillary refill takes less than 2 seconds.     Coloration: Skin is not jaundiced or pale.     Findings: No bruising, erythema, lesion or rash.  Neurological:     General: No focal deficit present.     Mental Status: She is alert and oriented to person, place, and time.  Mental status is at baseline.  Psychiatric:        Mood and Affect: Mood normal.        Behavior: Behavior normal.        Thought Content: Thought content normal.        Judgment: Judgment normal.     Results for orders placed or performed in visit on 06/18/20  Urine Culture   Specimen: Urine   UR  Result Value Ref Range   Urine Culture, Routine Final report    Organism ID, Bacteria Comment   Microscopic Examination   BLD  Result Value Ref Range   WBC, UA 11-30 (A) 0 - 5 /hpf   RBC 3-10 (A) 0 - 2 /hpf   Epithelial Cells (non renal) 0-10 0 - 10 /hpf   Casts Present None seen /lpf   Cast Type White cell casts (A) N/A   Bacteria, UA Moderate (A) None seen/Few  Bayer DCA Hb A1c Waived  Result Value Ref Range   HB A1C (BAYER DCA - WAIVED) 5.5 <7.0 %  CBC with Differential/Platelet  Result Value Ref Range   WBC 6.3 3.4 - 10.8 x10E3/uL   RBC 4.63 3.77 - 5.28 x10E6/uL   Hemoglobin 11.8 11.1 - 15.9 g/dL   Hematocrit 35.9 34.0 - 46.6 %   MCV 78 (L) 79 - 97 fL   MCH 25.5 (L) 26.6 - 33.0 pg   MCHC 32.9 31 - 35 g/dL   RDW 14.9 11.7 - 15.4 %   Platelets 349 150 - 450 x10E3/uL   Neutrophils 65 Not Estab. %   Lymphs 24 Not Estab. %   Monocytes 9 Not Estab. %   Eos 1 Not Estab. %   Basos 1 Not Estab. %   Neutrophils Absolute 4.1 1.40 - 7.00 x10E3/uL   Lymphocytes Absolute 1.5 0 - 3 x10E3/uL   Monocytes Absolute 0.5 0 - 0 x10E3/uL   EOS (ABSOLUTE) 0.1 0.0 - 0.4 x10E3/uL   Basophils Absolute 0.0 0 - 0 x10E3/uL   Immature Granulocytes 0 Not Estab. %   Immature Grans (Abs) 0.0 0.0 - 0.1 x10E3/uL  Comprehensive metabolic panel  Result Value Ref Range   Glucose 100 (H) 65 - 99 mg/dL   BUN 13 8 - 27 mg/dL   Creatinine, Ser 0.70 0.57 - 1.00 mg/dL   GFR calc non Af Amer 84 >59 mL/min/1.73   GFR calc Af Amer 97 >59 mL/min/1.73   BUN/Creatinine Ratio 19 12 - 28   Sodium 140 134 - 144 mmol/L   Potassium 4.4 3.5 - 5.2 mmol/L   Chloride 103 96 - 106 mmol/L   CO2 23 20 - 29 mmol/L    Calcium 9.3 8.7 - 10.3 mg/dL   Total Protein 6.9 6.0 - 8.5 g/dL  Albumin 4.0 3.7 - 4.7 g/dL   Globulin, Total 2.9 1.5 - 4.5 g/dL   Albumin/Globulin Ratio 1.4 1.2 - 2.2   Bilirubin Total 0.4 0.0 - 1.2 mg/dL   Alkaline Phosphatase 80 44 - 121 IU/L   AST 20 0 - 40 IU/L   ALT 14 0 - 32 IU/L  Lipid Panel w/o Chol/HDL Ratio  Result Value Ref Range   Cholesterol, Total 176 100 - 199 mg/dL   Triglycerides 155 (H) 0 - 149 mg/dL   HDL 53 >39 mg/dL   VLDL Cholesterol Cal 27 5 - 40 mg/dL   LDL Chol Calc (NIH) 96 0 - 99 mg/dL  Microalbumin, Urine Waived  Result Value Ref Range   Microalb, Ur Waived 150 (H) 0 - 19 mg/L   Creatinine, Urine Waived 100 10 - 300 mg/dL   Microalb/Creat Ratio >300 (H) <30 mg/g  Urinalysis, Routine w reflex microscopic  Result Value Ref Range   Specific Gravity, UA 1.015 1.005 - 1.030   pH, UA 5.0 5.0 - 7.5   Color, UA Yellow Yellow   Appearance Ur Clear Clear   Leukocytes,UA 2+ (A) Negative   Protein,UA 1+ (A) Negative/Trace   Glucose, UA Negative Negative   Ketones, UA Negative Negative   RBC, UA 2+ (A) Negative   Bilirubin, UA Negative Negative   Urobilinogen, Ur 0.2 0.2 - 1.0 mg/dL   Nitrite, UA Negative Negative   Microscopic Examination See below:   VITAMIN D 25 Hydroxy (Vit-D Deficiency, Fractures)  Result Value Ref Range   Vit D, 25-Hydroxy 31.0 30.0 - 100.0 ng/mL  Hepatitis C Antibody  Result Value Ref Range   Hep C Virus Ab <0.1 0.0 - 0.9 s/co ratio      Assessment & Plan:   Problem List Items Addressed This Visit      Other   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 Call with any concerns. Continue to monitor.       Relevant Orders   CBC with Differential/Platelet (Completed)   Prediabetes    Rechecking labs today. Await results. Treat as needed.       Relevant Orders   Bayer DCA Hb A1c Waived (Completed)   Comprehensive metabolic panel (Completed)   Lipid Panel w/o Chol/HDL Ratio (Completed)    Microalbumin, Urine Waived (Completed)    Other Visit Diagnoses    Acute cystitis with hematuria    -  Primary   Will treat with cipro and flagyl to cover entertitis as well. Call if not getting better or getting worse.    Relevant Orders   UA/M w/rflx Culture, Routine   Urinary frequency       +UTI.   Relevant Orders   Urinalysis, Routine w reflex microscopic (Completed)   Vitamin D deficiency       Labs drawn today. Await results.    Relevant Orders   VITAMIN D 25 Hydroxy (Vit-D Deficiency, Fractures) (Completed)   Need for hepatitis C screening test       Labs drawn today. Await results.    Relevant Orders   Hepatitis C Antibody (Completed)   Pyuria       Will treat. Await cuture. Call with any concerns.    Relevant Orders   Urine Culture (Completed)       Follow up plan: Return in about 6 months (around 12/16/2020) for Physical.

## 2020-06-19 LAB — COMPREHENSIVE METABOLIC PANEL
ALT: 14 IU/L (ref 0–32)
AST: 20 IU/L (ref 0–40)
Albumin/Globulin Ratio: 1.4 (ref 1.2–2.2)
Albumin: 4 g/dL (ref 3.7–4.7)
Alkaline Phosphatase: 80 IU/L (ref 44–121)
BUN/Creatinine Ratio: 19 (ref 12–28)
BUN: 13 mg/dL (ref 8–27)
Bilirubin Total: 0.4 mg/dL (ref 0.0–1.2)
CO2: 23 mmol/L (ref 20–29)
Calcium: 9.3 mg/dL (ref 8.7–10.3)
Chloride: 103 mmol/L (ref 96–106)
Creatinine, Ser: 0.7 mg/dL (ref 0.57–1.00)
GFR calc Af Amer: 97 mL/min/{1.73_m2} (ref >59)
GFR calc non Af Amer: 84 mL/min/{1.73_m2} (ref >59)
Globulin, Total: 2.9 g/dL (ref 1.5–4.5)
Glucose: 100 mg/dL — ABNORMAL HIGH (ref 65–99)
Potassium: 4.4 mmol/L (ref 3.5–5.2)
Sodium: 140 mmol/L (ref 134–144)
Total Protein: 6.9 g/dL (ref 6.0–8.5)

## 2020-06-19 LAB — VITAMIN D 25 HYDROXY (VIT D DEFICIENCY, FRACTURES): Vit D, 25-Hydroxy: 31 ng/mL (ref 30.0–100.0)

## 2020-06-19 LAB — URINALYSIS, ROUTINE W REFLEX MICROSCOPIC
Bilirubin, UA: NEGATIVE
Glucose, UA: NEGATIVE
Ketones, UA: NEGATIVE
Nitrite, UA: NEGATIVE
Specific Gravity, UA: 1.015 (ref 1.005–1.030)
Urobilinogen, Ur: 0.2 mg/dL (ref 0.2–1.0)
pH, UA: 5 (ref 5.0–7.5)

## 2020-06-19 LAB — CBC WITH DIFFERENTIAL/PLATELET
Basophils Absolute: 0 10*3/uL (ref 0.0–0.2)
Basos: 1 %
EOS (ABSOLUTE): 0.1 10*3/uL (ref 0.0–0.4)
Eos: 1 %
Hematocrit: 35.9 % (ref 34.0–46.6)
Hemoglobin: 11.8 g/dL (ref 11.1–15.9)
Immature Grans (Abs): 0 10*3/uL (ref 0.0–0.1)
Immature Granulocytes: 0 %
Lymphocytes Absolute: 1.5 10*3/uL (ref 0.7–3.1)
Lymphs: 24 %
MCH: 25.5 pg — ABNORMAL LOW (ref 26.6–33.0)
MCHC: 32.9 g/dL (ref 31.5–35.7)
MCV: 78 fL — ABNORMAL LOW (ref 79–97)
Monocytes Absolute: 0.5 10*3/uL (ref 0.1–0.9)
Monocytes: 9 %
Neutrophils Absolute: 4.1 10*3/uL (ref 1.4–7.0)
Neutrophils: 65 %
Platelets: 349 10*3/uL (ref 150–450)
RBC: 4.63 x10E6/uL (ref 3.77–5.28)
RDW: 14.9 % (ref 11.7–15.4)
WBC: 6.3 10*3/uL (ref 3.4–10.8)

## 2020-06-19 LAB — LIPID PANEL W/O CHOL/HDL RATIO
Cholesterol, Total: 176 mg/dL (ref 100–199)
HDL: 53 mg/dL (ref >39)
LDL Chol Calc (NIH): 96 mg/dL (ref 0–99)
Triglycerides: 155 mg/dL — ABNORMAL HIGH (ref 0–149)
VLDL Cholesterol Cal: 27 mg/dL (ref 5–40)

## 2020-06-19 LAB — MICROSCOPIC EXAMINATION

## 2020-06-19 LAB — MICROALBUMIN, URINE WAIVED
Creatinine, Urine Waived: 100 mg/dL (ref 10–300)
Microalb, Ur Waived: 150 mg/L — ABNORMAL HIGH (ref 0–19)
Microalb/Creat Ratio: >300 mg/g — ABNORMAL HIGH (ref <30)

## 2020-06-19 LAB — BAYER DCA HB A1C WAIVED: HB A1C (BAYER DCA - WAIVED): 5.5 % (ref <7.0)

## 2020-06-19 LAB — HEPATITIS C ANTIBODY: Hep C Virus Ab: <0.1 s/co ratio (ref 0.0–0.9)

## 2020-06-20 LAB — URINE CULTURE

## 2020-06-20 NOTE — Assessment & Plan Note (Signed)
Rechecking labs today. Await results. Treat as needed.  °

## 2020-06-20 NOTE — Assessment & Plan Note (Signed)
Will treat with cipro and flagyl. Call with any concerns. Continue to monitor.

## 2020-06-24 DIAGNOSIS — M5416 Radiculopathy, lumbar region: Secondary | ICD-10-CM | POA: Diagnosis not present

## 2020-07-01 ENCOUNTER — Other Ambulatory Visit: Payer: Medicare Other

## 2020-07-01 ENCOUNTER — Other Ambulatory Visit: Payer: Self-pay

## 2020-07-01 DIAGNOSIS — N3001 Acute cystitis with hematuria: Secondary | ICD-10-CM | POA: Diagnosis not present

## 2020-07-01 DIAGNOSIS — R82998 Other abnormal findings in urine: Secondary | ICD-10-CM | POA: Diagnosis not present

## 2020-07-01 DIAGNOSIS — R350 Frequency of micturition: Secondary | ICD-10-CM

## 2020-07-01 LAB — URINALYSIS, ROUTINE W REFLEX MICROSCOPIC
Bilirubin, UA: NEGATIVE
Glucose, UA: NEGATIVE
Ketones, UA: NEGATIVE
Nitrite, UA: NEGATIVE
Specific Gravity, UA: 1.02 (ref 1.005–1.030)
Urobilinogen, Ur: 0.2 mg/dL (ref 0.2–1.0)
pH, UA: 5 (ref 5.0–7.5)

## 2020-07-01 LAB — MICROSCOPIC EXAMINATION

## 2020-07-03 DIAGNOSIS — N200 Calculus of kidney: Secondary | ICD-10-CM

## 2020-07-03 HISTORY — DX: Calculus of kidney: N20.0

## 2020-07-03 LAB — URINE CULTURE

## 2020-07-10 ENCOUNTER — Ambulatory Visit: Payer: Self-pay | Admitting: *Deleted

## 2020-07-10 NOTE — Telephone Encounter (Signed)
Scheduled tomorrow

## 2020-07-10 NOTE — Telephone Encounter (Signed)
Pt called in c/o continued abd pain on left side.  She is also having lower back pain "but I have back problems so it's hard to tell if the pain is from something else or just my back". She is having frequency and light pink bloody urine at times.   She also mentioned she is having a brown discharge on her pads.   "I don't know where that is coming from".   "That's a new symptom".   She does not have a uterus when I asked her. She was treated for a UTI on 06/18/2020.  She had to bring in another urine sample after that on Nov. 29 and it still showed a UTI.   I took Flagyl for it.   "I just don't seem to be getting over this UTI".  Since she has been seen for this she agreed to have me send a note to Dr. Wynetta Emery letting her know about the continued symptoms and see what she would advise and if she needs to see her again or not.  I sent my notes to [facility] for Dr. Wynetta Emery.    Reason for Disposition . Blood in urine (red, pink, or tea-colored)    Treated on 06/18/2020 for UTI. Continued symptoms.   Note sent to Dr. Wynetta Emery  Answer Assessment - Initial Assessment Questions 1. LOCATION: "Where does it hurt?"      Hurting on left side of abd. 2. RADIATION: "Does the pain shoot anywhere else?" (e.g., chest, back)     I have back problems so hard to tell 3. ONSET: "When did the pain begin?" (e.g., minutes, hours or days ago)      I had a UTI.   Nov 29th gave a urine specimen and I still have the UTI.  I took [place] still. 4. SUDDEN: "Gradual or sudden onset?"     Looks like blood in my urine.   Not every time.   I'm wearing a pad but the drainage is brownish in color.   This is a new symptom 5. PATTERN "Does the pain come and go, or is it constant?"    - If constant: "Is it getting better, staying the same, or worsening?"      (Note: Constant means the pain never goes away completely; most serious pain is constant and it progresses)     - If intermittent: "How long does it last?"  "Do you have pain now?"     (Note: Intermittent means the pain goes away completely between bouts)     It feels like menstrual cramps. 6. SEVERITY: "How bad is the pain?"  (e.g., Scale 1-10; mild, moderate, or severe)   - MILD (1-3): doesn't interfere with normal activities, abdomen soft and not tender to touch    - MODERATE (4-7): interferes with normal activities or awakens from sleep, tender to touch    - SEVERE (8-10): excruciating pain, doubled over, unable to do any normal activities      9 I have a high tolerance for pain.  I didn't sleep all night.   7. RECURRENT SYMPTOM: "Have you ever had this type of stomach pain before?" If Yes, ask: "When was the last time?" and "What happened that time?"      Yes.   I was treated for UTI recently. 8. CAUSE: "What do you think is causing the stomach pain?"     UTI 9. RELIEVING/AGGRAVATING FACTORS: "What makes it better or worse?" (e.g., movement, antacids, bowel movement)  It doesn't seem to be resolving even after the antibiotics. 10. OTHER SYMPTOMS: "Has there been any vomiting, diarrhea, constipation, or urine problems?"       Frequency but I'm also drinking a lot of water.  It's not bloody every time I urinate. 11. PREGNANCY: "Is there any chance you are pregnant?" "When was your last menstrual period?"       N/A due to age.   Also has had a hysterectomy  Protocols used: ABDOMINAL PAIN - [facility]

## 2020-07-10 NOTE — Telephone Encounter (Signed)
appt

## 2020-07-11 ENCOUNTER — Ambulatory Visit (INDEPENDENT_AMBULATORY_CARE_PROVIDER_SITE_OTHER): Payer: Medicare Other | Admitting: Family Medicine

## 2020-07-11 ENCOUNTER — Ambulatory Visit
Admission: RE | Admit: 2020-07-11 | Discharge: 2020-07-11 | Disposition: A | Discharge status: Home / Self Care | Payer: Medicare Other | Source: Ambulatory Visit | Attending: Family Medicine | Admitting: Family Medicine

## 2020-07-11 ENCOUNTER — Encounter: Payer: Self-pay | Admitting: Family Medicine

## 2020-07-11 ENCOUNTER — Other Ambulatory Visit: Payer: Self-pay

## 2020-07-11 VITALS — BP 104/68 | HR 106 | Temp 98.4°F | Wt 161.6 lb

## 2020-07-11 DIAGNOSIS — Z87442 Personal history of urinary calculi: Secondary | ICD-10-CM | POA: Diagnosis not present

## 2020-07-11 DIAGNOSIS — R1032 Left lower quadrant pain: Secondary | ICD-10-CM

## 2020-07-11 DIAGNOSIS — R8281 Pyuria: Secondary | ICD-10-CM

## 2020-07-11 DIAGNOSIS — E785 Hyperlipidemia, unspecified: Secondary | ICD-10-CM | POA: Diagnosis not present

## 2020-07-11 DIAGNOSIS — R109 Unspecified abdominal pain: Secondary | ICD-10-CM

## 2020-07-11 DIAGNOSIS — N132 Hydronephrosis with renal and ureteral calculous obstruction: Secondary | ICD-10-CM | POA: Diagnosis not present

## 2020-07-11 DIAGNOSIS — Z803 Family history of malignant neoplasm of breast: Secondary | ICD-10-CM | POA: Diagnosis not present

## 2020-07-11 DIAGNOSIS — G2581 Restless legs syndrome: Secondary | ICD-10-CM | POA: Diagnosis not present

## 2020-07-11 DIAGNOSIS — M810 Age-related osteoporosis without current pathological fracture: Secondary | ICD-10-CM | POA: Diagnosis not present

## 2020-07-11 DIAGNOSIS — Z888 Allergy status to other drugs, medicaments and biological substances status: Secondary | ICD-10-CM | POA: Diagnosis not present

## 2020-07-11 DIAGNOSIS — Z981 Arthrodesis status: Secondary | ICD-10-CM | POA: Diagnosis not present

## 2020-07-11 DIAGNOSIS — Z823 Family history of stroke: Secondary | ICD-10-CM | POA: Diagnosis not present

## 2020-07-11 DIAGNOSIS — Z96652 Presence of left artificial knee joint: Secondary | ICD-10-CM | POA: Diagnosis not present

## 2020-07-11 DIAGNOSIS — Z932 Ileostomy status: Secondary | ICD-10-CM | POA: Diagnosis not present

## 2020-07-11 DIAGNOSIS — Z79899 Other long term (current) drug therapy: Secondary | ICD-10-CM | POA: Diagnosis not present

## 2020-07-11 DIAGNOSIS — M5136 Other intervertebral disc degeneration, lumbar region: Secondary | ICD-10-CM | POA: Diagnosis not present

## 2020-07-11 LAB — URINALYSIS, ROUTINE W REFLEX MICROSCOPIC
Bilirubin, UA: NEGATIVE
Glucose, UA: NEGATIVE
Ketones, UA: NEGATIVE
Nitrite, UA: NEGATIVE
Specific Gravity, UA: 1.015 (ref 1.005–1.030)
Urobilinogen, Ur: 0.2 mg/dL (ref 0.2–1.0)
pH, UA: 5 (ref 5.0–7.5)

## 2020-07-11 LAB — MICROSCOPIC EXAMINATION

## 2020-07-11 LAB — CBC WITH DIFFERENTIAL/PLATELET
Hematocrit: 37.8 % (ref 34.0–46.6)
Hemoglobin: 12.3 g/dL (ref 11.1–15.9)
Lymphocytes Absolute: 1 10*3/uL (ref 0.7–3.1)
Lymphs: 17 %
MCH: 26.5 pg — ABNORMAL LOW (ref 26.6–33.0)
MCHC: 32.5 g/dL (ref 31.5–35.7)
MCV: 81 fL (ref 79–97)
MID (Absolute): 0.2 10*3/uL (ref 0.1–1.6)
MID: 4 %
Neutrophils Absolute: 4.5 10*3/uL (ref 1.4–7.0)
Neutrophils: 79 %
Platelets: 226 10*3/uL (ref 150–450)
RBC: 4.65 x10E6/uL (ref 3.77–5.28)
RDW: 15.3 % (ref 11.7–15.4)
WBC: 5.7 10*3/uL (ref 3.4–10.8)

## 2020-07-11 MED ORDER — IOHEXOL 300 MG/ML  SOLN
100.0000 mL | Freq: Once | INTRAMUSCULAR | Status: AC | PRN
  Administered 2020-07-11: 100 mL via INTRAVENOUS

## 2020-07-11 NOTE — Progress Notes (Signed)
BP 104/68   Pulse (!) 106   Temp 98.4 F (36.9 C)   Wt 161 lb 9.6 oz (73.3 kg)   SpO2 96%   BMI 31.56 kg/m    Subjective:    Patient ID: Victoria Peterson, female    DOB: 1940-10-24, 79 y.o.   MRN: [id number]  HPI: Victoria Peterson is a 79 y.o. female  Chief Complaint  Patient presents with  . Pelvic Pain    Pt states pain is very bad in left pelvic area, feels like a stabbing pain   . Urinary Tract Infection    Pt states she is having urinary urgency and frequency    ABDOMINAL PAIN  Duration:10 days Onset: gradual Severity: severe Quality: aching, stabbing, sharp Location:  LLQ and L flank pain  Episode duration: constant Radiation: into her groin Frequency: constant Alleviating factors: nothing Aggravating factors: nothing Status: worse Treatments attempted: antibiotics Fever: no Nausea: yes Vomiting: no Weight loss: no Decreased appetite: yes Diarrhea: no Constipation: no Blood in stool: no Heartburn: no Jaundice: no Rash: no Dysuria/urinary frequency: yes Hematuria: no History of sexually transmitted disease: no Recurrent NSAID use: no   Relevant past medical, surgical, family and social history reviewed and updated as indicated. Interim medical history since our last visit reviewed. Allergies and medications reviewed and updated.  Review of Systems  Constitutional: Negative.   Respiratory: Negative.   Cardiovascular: Negative.   Gastrointestinal: Positive for abdominal pain. Negative for abdominal distention, anal bleeding, blood in stool, constipation, diarrhea, nausea, rectal pain and vomiting.  Genitourinary: Positive for flank pain and pelvic pain. Negative for decreased urine volume, difficulty urinating, dyspareunia, dysuria, enuresis, frequency, genital sores, hematuria, menstrual problem, urgency, vaginal bleeding, vaginal discharge and vaginal pain.  Neurological: Negative.   Psychiatric/Behavioral: Negative.     Per HPI unless  specifically indicated above     Objective:    BP 104/68   Pulse (!) 106   Temp 98.4 F (36.9 C)   Wt 161 lb 9.6 oz (73.3 kg)   SpO2 96%   BMI 31.56 kg/m   Wt Readings from Last 3 Encounters:  07/11/20 161 lb 9.6 oz (73.3 kg)  03/25/20 163 lb (73.9 kg)  02/19/20 169 lb 12.1 oz (77 kg)    Physical Exam Vitals and nursing note reviewed.  Constitutional:      General: She is not in acute distress.    Appearance: Normal appearance. She is not ill-appearing, toxic-appearing or diaphoretic.  HENT:     Head: Normocephalic and atraumatic.     Right Ear: External ear normal.     Left Ear: External ear normal.     Nose: Nose normal.     Mouth/Throat:     Mouth: Mucous membranes are moist.     Pharynx: Oropharynx is clear.  Eyes:     General: No scleral icterus.       Right eye: No discharge.        Left eye: No discharge.     Extraocular Movements: Extraocular movements intact.     Conjunctiva/sclera: Conjunctivae normal.     Pupils: Pupils are equal, round, and reactive to light.  Cardiovascular:     Rate and Rhythm: Regular rhythm. Tachycardia present.     Pulses: Normal pulses.     Heart sounds: Normal heart sounds. No murmur heard. No friction rub. No gallop.   Pulmonary:     Effort: Pulmonary effort is normal. No respiratory distress.     Breath sounds: Normal  breath sounds. No stridor. No wheezing, rhonchi or rales.  Chest:     Chest wall: No tenderness.  Abdominal:     General: Abdomen is flat. Bowel sounds are normal. There is no distension.     Palpations: There is no mass.     Tenderness: There is abdominal tenderness (LLQ). There is no right CVA tenderness, left CVA tenderness, guarding or rebound.     Hernia: No hernia is present.  Musculoskeletal:        General: Normal range of motion.     Cervical back: Normal range of motion and neck supple.  Skin:    General: Skin is warm and dry.     Capillary Refill: Capillary refill takes less than 2 seconds.      Coloration: Skin is not jaundiced or pale.     Findings: No bruising, erythema, lesion or rash.  Neurological:     General: No focal deficit present.     Mental Status: She is alert and oriented to person, place, and time. Mental status is at baseline.  Psychiatric:        Mood and Affect: Mood normal.        Behavior: Behavior normal.        Thought Content: Thought content normal.        Judgment: Judgment normal.     Results for orders placed or performed in visit on 07/01/20  Urine Culture   Specimen: Urine   UR  Result Value Ref Range   Urine Culture, Routine Final report    Organism ID, Bacteria Comment   Microscopic Examination   Urine  Result Value Ref Range   WBC, UA 6-10 (A) 0 - 5 /hpf   RBC 3-10 (A) 0 - 2 /hpf   Epithelial Cells (non renal) 0-10 0 - 10 /hpf   Bacteria, UA Few (A) None seen/Few  Urinalysis, Routine w reflex microscopic  Result Value Ref Range   Specific Gravity, UA 1.020 1.005 - 1.030   pH, UA 5.0 5.0 - 7.5   Color, UA Yellow Yellow   Appearance Ur Clear Clear   Leukocytes,UA 2+ (A) Negative   Protein,UA Trace (A) Negative/Trace   Glucose, UA Negative Negative   Ketones, UA Negative Negative   RBC, UA Trace (A) Negative   Bilirubin, UA Negative Negative   Urobilinogen, Ur 0.2 0.2 - 1.0 mg/dL   Nitrite, UA Negative Negative   Microscopic Examination See below:       Assessment & Plan:   Problem List Items Addressed This Visit   None   Visit Diagnoses    Flank pain    -  Primary   UA still shows 1+ leuks, trace blood, no better after abx, last culture grew mixed flora. Feeling worse. Concern for stone vs diverticulitis. Will check CT abd   Relevant Orders   CBC With Differential/Platelet   UA/M w/rflx Culture, Routine   CT Abdomen Pelvis W Contrast   LLQ pain       No better with abx. WBC not elevated. Getting worse. Will get CT abdomen/pelvis to look for cause. Await results. Treat as needed.    Relevant Orders   CT Abdomen Pelvis W  Contrast       Follow up plan: Return Pending results.Marland Kitchen

## 2020-07-11 NOTE — Addendum Note (Signed)
Addended by: Georgina Peer on: 07/11/2020 03:43 PM   Modules accepted: Orders

## 2020-07-12 ENCOUNTER — Telehealth: Payer: Self-pay | Admitting: Family Medicine

## 2020-07-12 ENCOUNTER — Other Ambulatory Visit: Payer: Self-pay | Admitting: Radiology

## 2020-07-12 ENCOUNTER — Inpatient Hospital Stay
Admission: RE | Admit: 2020-07-12 | Discharge: 2020-07-13 | DRG: 693 | Disposition: A | Discharge status: Home / Self Care | Payer: Medicare Other | Attending: Hospitalist | Admitting: Hospitalist

## 2020-07-12 ENCOUNTER — Ambulatory Visit: Payer: Medicare Other | Admitting: Urology

## 2020-07-12 ENCOUNTER — Encounter: Payer: Self-pay | Admitting: Urology

## 2020-07-12 ENCOUNTER — Ambulatory Visit
Admission: RE | Admit: 2020-07-12 | Discharge: 2020-07-12 | Disposition: A | Discharge status: Home / Self Care | Payer: Medicare Other | Source: Ambulatory Visit | Attending: Internal Medicine | Admitting: Internal Medicine

## 2020-07-12 VITALS — BP 124/80 | HR 101 | Ht 60.0 in | Wt 160.0 lb

## 2020-07-12 DIAGNOSIS — R1012 Left upper quadrant pain: Secondary | ICD-10-CM | POA: Diagnosis not present

## 2020-07-12 DIAGNOSIS — E785 Hyperlipidemia, unspecified: Secondary | ICD-10-CM | POA: Diagnosis present

## 2020-07-12 DIAGNOSIS — M810 Age-related osteoporosis without current pathological fracture: Secondary | ICD-10-CM | POA: Diagnosis present

## 2020-07-12 DIAGNOSIS — N1330 Unspecified hydronephrosis: Secondary | ICD-10-CM

## 2020-07-12 DIAGNOSIS — Z90710 Acquired absence of both cervix and uterus: Secondary | ICD-10-CM

## 2020-07-12 DIAGNOSIS — Z888 Allergy status to other drugs, medicaments and biological substances status: Secondary | ICD-10-CM | POA: Diagnosis not present

## 2020-07-12 DIAGNOSIS — N200 Calculus of kidney: Secondary | ICD-10-CM

## 2020-07-12 DIAGNOSIS — Z932 Ileostomy status: Secondary | ICD-10-CM | POA: Diagnosis not present

## 2020-07-12 DIAGNOSIS — Z981 Arthrodesis status: Secondary | ICD-10-CM | POA: Diagnosis not present

## 2020-07-12 DIAGNOSIS — Z79899 Other long term (current) drug therapy: Secondary | ICD-10-CM

## 2020-07-12 DIAGNOSIS — N201 Calculus of ureter: Secondary | ICD-10-CM | POA: Insufficient documentation

## 2020-07-12 DIAGNOSIS — M5136 Other intervertebral disc degeneration, lumbar region: Secondary | ICD-10-CM | POA: Diagnosis present

## 2020-07-12 DIAGNOSIS — Z823 Family history of stroke: Secondary | ICD-10-CM

## 2020-07-12 DIAGNOSIS — Z96652 Presence of left artificial knee joint: Secondary | ICD-10-CM | POA: Diagnosis present

## 2020-07-12 DIAGNOSIS — Z87442 Personal history of urinary calculi: Secondary | ICD-10-CM

## 2020-07-12 DIAGNOSIS — N132 Hydronephrosis with renal and ureteral calculous obstruction: Principal | ICD-10-CM | POA: Diagnosis present

## 2020-07-12 DIAGNOSIS — U071 COVID-19: Secondary | ICD-10-CM

## 2020-07-12 DIAGNOSIS — G2581 Restless legs syndrome: Secondary | ICD-10-CM | POA: Diagnosis not present

## 2020-07-12 DIAGNOSIS — Z803 Family history of malignant neoplasm of breast: Secondary | ICD-10-CM

## 2020-07-12 HISTORY — DX: COVID-19: U07.1

## 2020-07-12 HISTORY — PX: IR NEPHROSTOMY PLACEMENT LEFT: IMG6063

## 2020-07-12 LAB — CBC WITH DIFFERENTIAL/PLATELET
Abs Immature Granulocytes: 0.02 10*3/uL (ref 0.00–0.07)
Basophils Absolute: 0 10*3/uL (ref 0.0–0.1)
Basophils Relative: 0 %
Eosinophils Absolute: 0 10*3/uL (ref 0.0–0.5)
Eosinophils Relative: 0 %
HCT: 36.7 % (ref 36.0–46.0)
Hemoglobin: 12.1 g/dL (ref 12.0–15.0)
Immature Granulocytes: 0 %
Lymphocytes Relative: 21 %
Lymphs Abs: 1 10*3/uL (ref 0.7–4.0)
MCH: 26.6 pg (ref 26.0–34.0)
MCHC: 33 g/dL (ref 30.0–36.0)
MCV: 80.7 fL (ref 80.0–100.0)
Monocytes Absolute: 0.3 10*3/uL (ref 0.1–1.0)
Monocytes Relative: 7 %
Neutro Abs: 3.6 10*3/uL (ref 1.7–7.7)
Neutrophils Relative %: 72 %
Platelets: 217 10*3/uL (ref 150–400)
RBC: 4.55 MIL/uL (ref 3.87–5.11)
RDW: 14.4 % (ref 11.5–15.5)
WBC: 4.9 10*3/uL (ref 4.0–10.5)
nRBC: 0 % (ref 0.0–0.2)

## 2020-07-12 LAB — RESP PANEL BY RT-PCR (FLU A&B, COVID) ARPGX2
Influenza A by PCR: NEGATIVE
Influenza B by PCR: NEGATIVE
SARS Coronavirus 2 by RT PCR: POSITIVE — AB

## 2020-07-12 LAB — FIBRINOGEN: Fibrinogen: >750 mg/dL — ABNORMAL HIGH (ref 210–475)

## 2020-07-12 LAB — BASIC METABOLIC PANEL
Anion gap: 12 (ref 5–15)
BUN: 15 mg/dL (ref 8–23)
CO2: 24 mmol/L (ref 22–32)
Calcium: 8.9 mg/dL (ref 8.9–10.3)
Chloride: 100 mmol/L (ref 98–111)
Creatinine, Ser: 0.89 mg/dL (ref 0.44–1.00)
GFR, Estimated: >60 mL/min (ref >60)
Glucose, Bld: 97 mg/dL (ref 70–99)
Potassium: 4.4 mmol/L (ref 3.5–5.1)
Sodium: 136 mmol/L (ref 135–145)

## 2020-07-12 LAB — FERRITIN: Ferritin: 156 ng/mL (ref 11–307)

## 2020-07-12 LAB — LACTATE DEHYDROGENASE: LDH: 202 U/L — ABNORMAL HIGH (ref 98–192)

## 2020-07-12 LAB — C-REACTIVE PROTEIN: CRP: 10.6 mg/dL — ABNORMAL HIGH (ref <1.0)

## 2020-07-12 MED ORDER — FENTANYL CITRATE (PF) 100 MCG/2ML IJ SOLN
INTRAMUSCULAR | Status: AC
  Filled 2020-07-12: qty 2

## 2020-07-12 MED ORDER — ROPINIROLE HCL 1 MG PO TABS
0.5000 mg | ORAL_TABLET | Freq: Every day | ORAL | Status: DC
  Administered 2020-07-12: 0.5 mg via ORAL
  Filled 2020-07-12: qty 1

## 2020-07-12 MED ORDER — ACETAMINOPHEN 650 MG RE SUPP: 650.0000 mg | Freq: Four times a day (QID) | RECTAL | Status: DC | PRN

## 2020-07-12 MED ORDER — OXYCODONE-ACETAMINOPHEN 5-325 MG PO TABS
1.0000 | ORAL_TABLET | Freq: Four times a day (QID) | ORAL | Status: DC | PRN
  Administered 2020-07-12: 22:00:00 1 via ORAL
  Filled 2020-07-12: qty 1

## 2020-07-12 MED ORDER — MIDAZOLAM HCL 2 MG/2ML IJ SOLN
INTRAMUSCULAR | Status: AC
  Filled 2020-07-12: qty 2

## 2020-07-12 MED ORDER — FENTANYL CITRATE (PF) 100 MCG/2ML IJ SOLN
INTRAMUSCULAR | Status: DC | PRN
  Administered 2020-07-12 (×2): 50 ug via INTRAVENOUS

## 2020-07-12 MED ORDER — VANCOMYCIN HCL IN DEXTROSE 1-5 GM/200ML-% IV SOLN: 1000.0000 mg | INTRAVENOUS | Status: DC

## 2020-07-12 MED ORDER — ONDANSETRON HCL 4 MG/2ML IJ SOLN: 4.0000 mg | Freq: Four times a day (QID) | INTRAMUSCULAR | Status: DC | PRN

## 2020-07-12 MED ORDER — VANCOMYCIN HCL IN DEXTROSE 1-5 GM/200ML-% IV SOLN
INTRAVENOUS | Status: AC
  Filled 2020-07-12: qty 200

## 2020-07-12 MED ORDER — SODIUM CHLORIDE 0.9 % IV SOLN: INTRAVENOUS | Status: DC

## 2020-07-12 MED ORDER — ALBUTEROL SULFATE (2.5 MG/3ML) 0.083% IN NEBU: 2.5000 mg | INHALATION_SOLUTION | RESPIRATORY_TRACT | Status: DC | PRN

## 2020-07-12 MED ORDER — MIDAZOLAM HCL 2 MG/2ML IJ SOLN
INTRAMUSCULAR | Status: DC | PRN
  Administered 2020-07-12 (×2): 1 mg via INTRAVENOUS

## 2020-07-12 MED ORDER — ACETAMINOPHEN 325 MG PO TABS: 650.0000 mg | ORAL_TABLET | Freq: Four times a day (QID) | ORAL | Status: DC | PRN

## 2020-07-12 MED ORDER — LACTATED RINGERS IV SOLN: INTRAVENOUS | Status: DC

## 2020-07-12 MED ORDER — IODIXANOL 320 MG/ML IV SOLN
50.0000 mL | Freq: Once | INTRAVENOUS | Status: AC
  Administered 2020-07-12: 17:00:00 10 mL
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
## 2020-07-12 NOTE — Telephone Encounter (Signed)
Called while on call last night 9PM. Discussed results with patient- options to go to ER for evaluation if pain was severe or to see if we could get her into urology today. Referral generated.
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
## 2020-07-12 NOTE — Patient Instructions (Signed)
Percutaneous Nephrolithotomy Percutaneous nephrolithotomy is a procedure to remove kidney stones. Kidney stones are deposits that form inside your kidneys and can cause pain. You may need this procedure if:  You have large kidney stones. Kidney stones that are bigger than 2 cm (0.78 in.) wide may require this procedure.  Your kidney stones are oddly shaped.  Other treatments have not been successful in helping the kidney stones to pass.  You have developed an infection due to the kidney stones. Tell a health care provider about:  Any allergies you have.  All medicines you are taking, including vitamins, herbs, eye drops, creams, and over-the-counter medicines.  Any problems you or family members have had with anesthetic medicines.  Any blood disorders you have.  Any surgeries you have had.  Any medical conditions you have.  Whether you are pregnant or may be pregnant.  Whether you use any tobacco products, including cigarettes, chewing tobacco, or e-cigarettes. What are the risks? Generally, this is a safe procedure. However, problems may occur, including:  Infection.  Bleeding. This may include blood in your urine.  Allergic reactions to medicines.  Damage to other structures or organs.  Kidney damage.  Holes in the kidney. These often heal on their own.  Numbness or tingling in the affected area.  Inability to remove all the stones. You may need a different procedure to complete treatment. What happens before the procedure? Staying hydrated Follow instructions from your health care provider about hydration, which may include:  Up to 2 hours before the procedure - you may continue to drink clear liquids, such as water, clear fruit juice, black coffee, and plain tea.  Eating and drinking restrictions Follow instructions from your health care provider about eating and drinking, which may include:  8 hours before the procedure - stop eating heavy meals or foods,  such as meat, fried foods, or fatty foods.  6 hours before the procedure - stop eating light meals or foods, such as toast or cereal.  6 hours before the procedure - stop drinking milk or drinks that contain milk.  2 hours before the procedure - stop drinking clear liquids. Medicines Ask your health care provider about:  Changing or stopping your regular medicines. This is especially important if you are taking diabetes medicines or blood thinners.  Taking medicines such as aspirin and ibuprofen. These medicines can thin your blood. Do not take these medicines unless your health care provider tells you to take them.  Taking over-the-counter medicines, vitamins, herbs, and supplements. Tests You may have tests, including:  Blood tests.  Urine tests.  Tests to check how your heart is working.  Imaging studies. These are used to identify: ? The size and number (stone burden) of the kidney stones. ? The position of the kidney stones. General instructions  Plan to have someone take you home from the hospital or clinic.  Plan to have a responsible adult care for you for at least 24 hours after you leave the hospital or clinic. This is important.  Ask your health care provider how your surgical site will be marked or identified.  Ask your health care provider what steps will be taken to help prevent infection. These may include: ? Removing hair at the surgery site. ? Washing skin with a germ-killing soap. ? Taking antibiotic medicine. What happens during the procedure?   An IV will be inserted into one of your veins.  The site of the procedure will be marked.  You will be  given one or more of the following: ? A medicine to help you relax (sedative). ? A medicine to numb the area (local anesthetic). ? A medicine to make you fall asleep (general anesthetic). ? A medicine that is injected into your spine to numb the area below and slightly above the injection site (spinal  anesthetic). ? A medicine that is injected into an area of your body to numb everything below the injection site (regional anesthetic).  A thin tube (urinary catheter) will be put in your bladder to drain urine during and after the procedure.  Your surgeon will make a small cut (incision) in your lower back.  A tube will be inserted through the incision into your kidney.  Each kidney stone will be removed through this tube. Larger stones may need to be broken up with a high-intensity light beam (laser) or other tools.  After all of the stones have been removed, your health care provider may put in tubes to drain your bladder. Based on your condition: ? An internal tube, called a stent, may be put in your ureter. This will help drain urine from your kidney to your bladder. ? A surgical drain (nephrostomy tube) may be put in your kidney. The tube comes out through the incision in your lower back. This will help to drain urine or any fluid that builds up while your kidney heals.  Part of the incision may be closed with stitches (sutures).  A bandage (dressing) will be placed over the incision area. The procedure may vary among health care providers and hospitals. What happens after the procedure?  Your blood pressure, heart rate, breathing rate, and blood oxygen level will be monitored until you leave the hospital or clinic.  You may be given medicine for pain.  You will be shown how to do breathing exercises, such as coughing and breathing deeply. These will help to prevent pneumonia.  You will be encouraged to walk. Walking helps to prevent blood clots.  Your stent and urinary catheter will be removed after 1-2 days if there is only a small amount of blood in your urine.  You will be taught how to care for the catheter or nephrostomy tube, if you have them.  Do not drive for 24 hours if you were given a sedative during your procedure. Summary  Percutaneous nephrolithotomy is a  procedure to remove kidney stones.  Ask your health care provider about changing or stopping your regular medicines.  Before surgery, follow instructions from your health care provider about eating and drinking.  Plan to have someone take you home from the hospital or clinic. This information is not intended to replace advice given to you by your health care provider. Make sure you discuss any questions you have with your health care provider. Document Revised: 09/15/2018 Document Reviewed: 02/09/2018 Elsevier Patient Education  [place].    Percutaneous Nephrostomy  Percutaneous nephrostomy is a procedure to insert a flexible tube into your kidney so that urine can leave your body. This procedure may be done if a medical condition prevents urine from leaving your kidney in the usual way. Urine is normally carried from the kidneys to the bladder through narrow tubes called ureters. A ureter can become blocked because of conditions such as kidney stones, tumors, infection, or blood clots. The nephrostomy tube will be inserted through your back. After the procedure, the tube will remain in place, and urine will drain from the kidney into a drainage bag outside your  body. Draining the urine will relieve pressure and help prevent infection that could damage the kidney. Often, this procedure allows your health care provider to identify the cause of the blockage and plan appropriate treatment. Tell a health care provider about:  Any allergies you have.  All medicines you are taking, including vitamins, herbs, eye drops, creams, and over-the-counter medicines.  Any problems you or family members have had with anesthetic medicines.  Any blood disorders you have.  Any surgeries you have had.  Any medical conditions you have.  Whether you are pregnant or may be pregnant. What are the risks? Generally, this is a safe procedure. However, problems may occur,  including:  Infection.  Bleeding.  Allergic reactions to medicines or dyes used in the procedure.  Damage to other structures or organs. What happens before the procedure?  Follow instructions from your health care provider about eating or drinking restrictions.  Ask your health care provider about: ? Changing or stopping your regular medicines. This is especially important if you are taking diabetes medicines or blood thinners. ? Taking medicines such as aspirin and ibuprofen. These medicines can thin your blood. Do not take these medicines before your procedure if your health care provider instructs you not to.  You may be given antibiotic medicine to help prevent infection.  You may have blood tests to see how well your kidneys and liver are working and to see how well your blood can clot.  Plan to have someone take you home from the hospital or clinic. What happens during the procedure?  To reduce your risk of infection: ? Your health care team will wash or sanitize their hands. ? Your skin will be washed with soap.  An IV tube will be inserted into one of your veins. You may be given medicines through this IV tube to help prevent nausea and pain.  You will be positioned on your abdomen.  You will be given one or more of the following: ? A medicine to help you relax (sedative). ? A medicine to numb the area (local anesthetic) where the nephrostomy tube will be inserted.  The nephrostomy tube, which is thin and flexible, will be inserted into a needle.  The needle will be inserted into your body and guided to your kidney. An imaging method that uses X-ray images (fluoroscopy) will be used to help guide the needle to the kidney.  A dye will be injected through the nephrostomy tube. Then, X-ray images that highlight your kidney will be taken.  Next, the needle will be removed, but the nephrostomy tube will be left in your kidney. The tube may be secured to your skin with  stitches (sutures).  A drainage bag will be attached to the nephrostomy tube. Urine will be able to drain from your kidney to this drainage bag outside your body. The procedure may vary among health care providers and hospitals. What happens after the procedure?  Your blood pressure, heart rate, breathing rate, and blood oxygen level will be monitored until the medicines you were given have worn off.  You will need to remain lying down for several hours.  You will be taught how to care for the nephrostomy tube and the drainage bag.  Donot drive for 24 hours if you were given a sedative. This information is not intended to replace advice given to you by your health care provider. Make sure you discuss any questions you have with your health care provider. Document Revised: 07/02/2017 Document Reviewed:  05/01/2016 Elsevier Patient Education  [place].

## 2020-07-12 NOTE — Progress Notes (Signed)
Patient clinically stable post Nephrostomy tube placement per DR Suttle, tolerated well. Left neph dressing dry/intact, awake;/alert and oriented post procedure, report given to care nurse post procedure 119 with questions answered.

## 2020-07-12 NOTE — Procedures (Signed)
Interventional Radiology Procedure Note  Procedure: Left nephrostomy tube placement  Findings: Please refer to procedural dictation for full description.  Left 10.2 Fr nephrostomy, posterior lower pole calyceal access.  Trace, mildly purulent urine output upon access.  Complications: None immediate  Estimated Blood Loss: <5 mL  Recommendations: Keep to bag drainage. IR will follow.    Ruthann Cancer, MD Pager: [phone]
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
## 2020-07-13 ENCOUNTER — Encounter: Payer: Self-pay | Admitting: Internal Medicine

## 2020-07-13 ENCOUNTER — Other Ambulatory Visit: Payer: Self-pay

## 2020-07-13 LAB — C-REACTIVE PROTEIN: CRP: 9.5 mg/dL — ABNORMAL HIGH (ref <1.0)

## 2020-07-13 LAB — LACTATE DEHYDROGENASE: LDH: 183 U/L (ref 98–192)

## 2020-07-13 LAB — CBC WITH DIFFERENTIAL/PLATELET
Abs Immature Granulocytes: 0.02 10*3/uL (ref 0.00–0.07)
Basophils Absolute: 0 10*3/uL (ref 0.0–0.1)
Basophils Relative: 0 %
Eosinophils Absolute: 0 10*3/uL (ref 0.0–0.5)
Eosinophils Relative: 0 %
HCT: 33.8 % — ABNORMAL LOW (ref 36.0–46.0)
Hemoglobin: 10.9 g/dL — ABNORMAL LOW (ref 12.0–15.0)
Immature Granulocytes: 1 %
Lymphocytes Relative: 30 %
Lymphs Abs: 1.2 10*3/uL (ref 0.7–4.0)
MCH: 26 pg (ref 26.0–34.0)
MCHC: 32.2 g/dL (ref 30.0–36.0)
MCV: 80.5 fL (ref 80.0–100.0)
Monocytes Absolute: 0.3 10*3/uL (ref 0.1–1.0)
Monocytes Relative: 8 %
Neutro Abs: 2.4 10*3/uL (ref 1.7–7.7)
Neutrophils Relative %: 61 %
Platelets: 219 10*3/uL (ref 150–400)
RBC: 4.2 MIL/uL (ref 3.87–5.11)
RDW: 14.5 % (ref 11.5–15.5)
WBC: 3.9 10*3/uL — ABNORMAL LOW (ref 4.0–10.5)
nRBC: 0 % (ref 0.0–0.2)

## 2020-07-13 LAB — COMPREHENSIVE METABOLIC PANEL
ALT: 27 U/L (ref 0–44)
AST: 40 U/L (ref 15–41)
Albumin: 3 g/dL — ABNORMAL LOW (ref 3.5–5.0)
Alkaline Phosphatase: 43 U/L (ref 38–126)
Anion gap: 10 (ref 5–15)
BUN: 19 mg/dL (ref 8–23)
CO2: 21 mmol/L — ABNORMAL LOW (ref 22–32)
Calcium: 8.7 mg/dL — ABNORMAL LOW (ref 8.9–10.3)
Chloride: 101 mmol/L (ref 98–111)
Creatinine, Ser: 0.84 mg/dL (ref 0.44–1.00)
GFR, Estimated: >60 mL/min (ref >60)
Glucose, Bld: 103 mg/dL — ABNORMAL HIGH (ref 70–99)
Potassium: 4.2 mmol/L (ref 3.5–5.1)
Sodium: 132 mmol/L — ABNORMAL LOW (ref 135–145)
Total Bilirubin: 1 mg/dL (ref 0.3–1.2)
Total Protein: 6.9 g/dL (ref 6.5–8.1)

## 2020-07-13 LAB — FIBRINOGEN: Fibrinogen: 705 mg/dL — ABNORMAL HIGH (ref 210–475)

## 2020-07-13 LAB — MAGNESIUM: Magnesium: 2.1 mg/dL (ref 1.7–2.4)

## 2020-07-13 LAB — FERRITIN: Ferritin: 128 ng/mL (ref 11–307)

## 2020-07-13 MED ORDER — SODIUM CHLORIDE 0.9 % IV SOLN
Freq: Once | INTRAVENOUS | Status: DC
  Filled 2020-07-13: qty 20

## 2020-07-13 MED ORDER — ALBUTEROL SULFATE HFA 108 (90 BASE) MCG/ACT IN AERS
2.0000 | INHALATION_SPRAY | Freq: Once | RESPIRATORY_TRACT | Status: DC | PRN
  Filled 2020-07-13: qty 6.7

## 2020-07-13 MED ORDER — SULFAMETHOXAZOLE-TRIMETHOPRIM 800-160 MG PO TABS
1.0000 | ORAL_TABLET | Freq: Two times a day (BID) | ORAL | Status: DC
  Administered 2020-07-13: 11:00:00 1 via ORAL
  Filled 2020-07-13 (×2): qty 1

## 2020-07-13 MED ORDER — DIPHENHYDRAMINE HCL 50 MG/ML IJ SOLN: 50.0000 mg | Freq: Once | INTRAMUSCULAR | Status: DC | PRN

## 2020-07-13 MED ORDER — SODIUM CHLORIDE 0.9 % IV SOLN
1200.0000 mg | Freq: Once | INTRAVENOUS | Status: AC
  Administered 2020-07-13: 14:00:00 1200 mg via INTRAVENOUS
  Filled 2020-07-13: qty 10

## 2020-07-13 MED ORDER — METHYLPREDNISOLONE SODIUM SUCC 125 MG IJ SOLR: 125.0000 mg | Freq: Once | INTRAMUSCULAR | Status: DC | PRN

## 2020-07-13 MED ORDER — FAMOTIDINE IN NACL 20-0.9 MG/50ML-% IV SOLN
20.0000 mg | Freq: Once | INTRAVENOUS | Status: DC | PRN
  Filled 2020-07-13: qty 50

## 2020-07-13 MED ORDER — EPINEPHRINE 0.3 MG/0.3ML IJ SOAJ
0.3000 mg | Freq: Once | INTRAMUSCULAR | Status: DC | PRN
  Filled 2020-07-13: qty 0.3

## 2020-07-13 MED ORDER — SULFAMETHOXAZOLE-TRIMETHOPRIM 800-160 MG PO TABS: 1.0000 | ORAL_TABLET | Freq: Two times a day (BID) | ORAL | 0 refills | Status: AC

## 2020-07-13 MED ORDER — HYDROCOD POLST-CPM POLST ER 10-8 MG/5ML PO SUER
5.0000 mL | Freq: Two times a day (BID) | ORAL | Status: DC | PRN
Start: 2020-07-13 — End: 2020-07-13

## 2020-07-13 MED ORDER — SODIUM CHLORIDE 0.9 % IV SOLN: INTRAVENOUS | Status: DC | PRN

## 2020-07-13 NOTE — Care Management CC44 (Signed)
Condition Code 44 Documentation Completed  Patient Details  Name: Victoria Peterson MRN: [id number] Date of Birth: [birth year]   Condition Code 44 given:   Yes Patient signature on Condition Code 44 notice:   Yes Documentation of 2 MD's agreement:   Yes Code 44 added to claim:   Yes    Truitt Merle, LCSW 07/13/2020, 2:12 PM

## 2020-07-13 NOTE — Progress Notes (Signed)
BAMLANIVIMAB/ETESEVIMAB On 01/26/20 HHS/ASPR paused distribution of bamlanivimab/etesevimab nationwide due to increasing frequencies of 9.1 and B.1.351 COVID variants.  Results from in vitro assays that are used to assess the susceptibility of viral variants to particular monoclonal antibodies suggest that bamlanivimab and  etesevimab administered together are not active against either the P.1 or B.1.351 variants. FDA has recommended that health care providers use  alternative authorized monoclonal antibodies. Given this information, the system will cease use of bamlanivimab/etesevimab for outpatient treatment of SARS-CoV-2 patients and switch to  casirivimab/imdevimab for patients that meet EUA criteria.  The order for bamlanivimab/etesevimab was discontinued and switched to casirivimab/imdevimab.

## 2020-07-13 NOTE — Discharge Summary (Signed)
Physician Discharge Summary   Victoria Peterson  female DOB: [birth year]  QBV:[id number]  PCP: Valerie Roys, DO  Admit date: 07/12/2020 Discharge date: 07/13/2020  Admitted From: home Disposition:  home CODE STATUS: Full code  Discharge Instructions    Discharge instructions   Complete by: As directed    You have received a left nephrostomy tube.  Dr. Erlene Quan will arrange for you to get the kidney stone out in her clinic.  Dr. Erlene Quan would like you to take antibiotic Bactrim for 1 week as directed.  You have COVID infection, but with no symptom, so you don't need treatment at this point.  You have received COVID antibodies in the hospital to help reduce your risk of getting sicker from [place].   Dr. Enzo Bi [facility] Course:  For full details, please see H&P, progress notes, consult notes and ancillary notes.  Briefly,  Victoria Peterson is a 79 y.o. female with medical history significant for ulcerative colitis status post ileostomy, restless leg syndrome, recently diagnosed obstructing left ureterolithiasis who was admitted directly to [facility] on 07/12/2020 from [facility] interventional radiology suite for overnight observation following placement of left-sided percutaneous nephrostomy tube at the request of patient's urologist, Dr. Erlene Quan.  #) Obstructing 3.5 cm left ureteral calculus s/p placement of left percutaneous nephrostomy tube Pt tolerated the procedure well.  Per Dr. Erlene Quan, pt was cleared to go home and will have stone removal done in her clinic.  Pt was discharged on a week of Bactrim for empiric tx per Dr. Erlene Quan.  Pt was taught how to take care of her nephrostomy tube and urine bag by nursing prior to discharge.  #) COVID-19 infection Found positive during routine screening.  Pt is asymptomatic and without hypoxia.  Pt was given mAb REGEN-COV before discharge.  #) Restless leg syndrome:   Continued home nocturnal ropinirole.   Discharge Diagnoses:  Principal Problem:   Renal calculus, left Active Problems:   RLS (restless legs syndrome)   COVID-19 virus infection    Discharge Instructions:  Allergies as of 07/13/2020      Reactions   Cefazolin Other (See Comments)   Hypotension      Medication List    TAKE these medications   acetaminophen 500 MG tablet Commonly known as: TYLENOL Take 500-1,000 mg by mouth every 6 (six) hours as needed for moderate pain.   CALCIUM + D + K PO Take 2 tablets by mouth daily.   diclofenac Sodium 1 % Gel Commonly known as: VOLTAREN Apply 2 g topically 4 (four) times daily as needed (knee pain).   multivitamin tablet Take 1 tablet by mouth daily.   rOPINIRole 0.25 MG tablet Commonly known as: REQUIP Take 0.5 mg by mouth at bedtime.   sulfamethoxazole-trimethoprim 800-160 MG tablet Commonly known as: BACTRIM DS Take 1 tablet by mouth every 12 (twelve) hours for 7 days. Antibiotic.        Follow-up Information    Park Liter P, DO. Schedule an appointment as soon as possible for a visit in 1 week.   Specialty: Family Medicine Why: patient to make own follow up appointment Contact information: [place] [address] [phone]        Hollice Espy, MD.   Specialty: Urology Why: patient for kidney stone removal Contact information: [place] [address] [phone]  Allergies  Allergen Reactions   Cefazolin Other (See Comments)    Hypotension     The results of significant diagnostics from this hospitalization (including imaging, microbiology, ancillary and laboratory) are listed below for reference.   Consultations:   Procedures/Studies: CT Abdomen Pelvis W Contrast  Result Date: 07/11/2020 CLINICAL DATA:  79 year old female with left lower quadrant abdominal pain for 4 days. History of ileostomy. EXAM: CT ABDOMEN AND PELVIS  WITH CONTRAST TECHNIQUE: Multidetector CT imaging of the abdomen and pelvis was performed using the standard protocol following bolus administration of intravenous contrast. CONTRAST:  154m OMNIPAQUE IOHEXOL 300 MG/ML  SOLN COMPARISON:  Abdominal radiographs 10/14/2019. FINDINGS: Lower chest: Cardiac size within normal limits. No pericardial or pleural effusion. Mild lung base atelectasis versus scarring. Hepatobiliary: Liver and gallbladder are within normal limits. Pancreas: Negative. Spleen: Negative. Adrenals/Urinary Tract: Normal adrenal glands. Renal contrast enhancement and on delayed images contrast excretion appears relatively symmetric. 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 And there is a mild to moderate degree of left renal hydronephrosis despite the relatively symmetric nephrograms. Additional bulky left renal calculi individually up to 14 mm (lower pole coronal image 56). No right nephrolithiasis or hydronephrosis. Superimposed bilateral benign appearing renal cysts. The left ureter appears decompressed beyond its crossing of the iliac vessels. The right ureter seems to remain within normal limits. Unremarkable urinary bladder. Stomach/Bowel: Numerous surgical clips throughout the abdominal and pelvic mesentery. Apparent blind-ending rectum in the deep pelvis (series 2, image 70) with no residual large bowel identified in the abdomen or at the pelvic inlet. Large J pouch in the central lower abdomen and pelvis which is opacified with oral contrast (series 2, image 65). And along the right superior border of a portion of the J pouch is a completely decompressed ileostomy loop (coronal images 29 through 42). This appears to connect to an ostomy bag located in the right abdominal panniculus. Upstream of the J pouch none  of the small bowel is abnormally dilated. Stomach and duodenum appear within normal limits. No free air, free fluid, mesenteric stranding. Superimposed chronic postoperative changes to the ventral lower abdominal wall. Vascular/Lymphatic: Aortoiliac calcified atherosclerosis. Major arterial structures remain patent. Portal venous system is patent. No lymphadenopathy. Reproductive: Surgically absent. Other: Extensive surgical clips along the pelvic floor and deep pelvis. No pelvic free fluid. Musculoskeletal: Widespread advanced disc degeneration in the spine. Multilevel vacuum disc. Multilevel degenerative lumbar spondylolisthesis. Associated advanced lumbar facet arthropathy. No acute osseous abnormality identified. IMPRESSION: 1. 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 Despite the findings, bilateral renal enhancement and contrast excretion remains relatively symmetric. Therefore, consider also infected left renal collecting system in addition to obstructive uropathy. No evidence of parenchymal pyelonephritis at this time. 2. Additional bulky left renal calculi. Distal ureters and bladder appear within normal limits. 3. Extensive postoperative changes to the bowel including subtotal colectomy. Dilated J pouch in the pelvis containing contrast. Difficult to exclude stenosis at the anastomosis of the J pouch and ileostomy loop, although absence of dilated upstream small bowel argues against acute bowel obstruction. Electronically Signed   By: HGenevie AnnM.D.   On: 07/11/2020 18:49   IR NEPHROSTOMY PLACEMENT LEFT  Result Date: 07/13/2020 INDICATION: 79year old female with history of obstructive left nephrolithiasis presenting with developing urosepsis.  EXAM: 1. ULTRASOUND GUIDANCE FOR PUNCTURE OF THE LEFT RENAL COLLECTING SYSTEM 2. LEFT PERCUTANEOUS NEPHROSTOMY TUBE PLACEMENT. COMPARISON:  None. MEDICATIONS: Vancomycin 1 gm IV; The  antibiotic was administered in an appropriate time frame prior to skin puncture. ANESTHESIA/SEDATION: Moderate (conscious) sedation was employed during this procedure. A total of Versed 2 mg and Fentanyl 50 mcg was administered intravenously. Moderate Sedation Time: 16 minutes. The patient's level of consciousness and vital signs were monitored continuously by radiology nursing throughout the procedure under my direct supervision. CONTRAST:  Fifteen mL Isovue 300 - administered into the renal collecting system FLUOROSCOPY TIME:  3.2 minutes COMPLICATIONS: None immediate. PROCEDURE: The procedure, risks, benefits, and alternatives were explained to the patient. Questions regarding the procedure were encouraged and answered. The patient understands and consents to the procedure. A timeout was performed prior to the initiation of the procedure. The left flank region was prepped and draped in the usual sterile fashion and a sterile drape was applied covering the operative field. A sterile gown and sterile gloves were used for the procedure. Local anesthesia was provided with 1% Lidocaine with epinephrine. Ultrasound was used to localize the left kidney. Under direct ultrasound guidance, a 20 gauge needle was advanced into the renal collecting system. An ultrasound image documentation was performed. Access within the collecting system was confirmed with the efflux of urine followed by limited contrast injection. Over a Nitrex wire, the tract was dilated with an Accustick stent. Next, under intermittent fluoroscpic guidance and over a short Amplatz wire, the track was dilated ultimately allowing placement of a 10-French percutaneous nephrostomy catheter which was advanced to the level of the renal pelvis where the coil was formed and locked. Contrast was injected and several spot fluoroscopic images were obtained. The catheter was secured at the skin with a 0 silk retention suture and stat lock device and connected to a  gravity bag was placed. Dressings were applied. The patient tolerated procedure well without immediate postprocedural complication. FINDINGS: Ultrasound scanning demonstrates a moderate dilated left collecting system with large staghorn renal calculus. Under a combination of ultrasound and fluoroscopic guidance, a posterior inferior calix was targeted allowing placement of a 10-French percutaneous nephrostomy catheter with end coiled and locked within the renal pelvis. Contrast injection confirmed appropriate positioning. IMPRESSION: Successful ultrasound and fluoroscopic guided placement of a left sided 10.2 [place] PCN. PLAN: Return in 1 month for routine check and exchange pending definitive Urologic intervention. Ruthann Cancer, MD [facility] Radiology Electronically Signed   By: Ruthann Cancer MD   On: 07/13/2020 09:08      Labs: BNP (last 3 results) Recent Labs    10/10/19 1834  BNP 92.3   Basic Metabolic Panel: Recent Labs  Lab 07/12/20 1919 07/13/20 0418  NA 136 132*  K 4.4 4.2  CL 100 101  CO2 24 21*  GLUCOSE 97 103*  BUN 15 19  CREATININE 0.89 0.84  CALCIUM 8.9 8.7*  MG  --  2.1   Liver Function Tests: Recent Labs  Lab 07/13/20 0418  AST 40  ALT 27  ALKPHOS 43  BILITOT 1.0  PROT 6.9  ALBUMIN 3.0*   No results for input(s): LIPASE, AMYLASE in the last 168 hours. No results for input(s): AMMONIA in the last 168 hours. CBC: Recent Labs  Lab 07/11/20 1442 07/12/20 1919 07/13/20 0418  WBC 5.7 4.9 3.9*  NEUTROABS 4.5 3.6 2.4  HGB 12.3 12.1 10.9*  HCT 37.8 36.7 33.8*  MCV 81 80.7 80.5  PLT 226 217 219   Cardiac Enzymes: No results for input(s): CKTOTAL, CKMB, CKMBINDEX, TROPONINI in the last 168 hours. BNP: Invalid input(s): POCBNP CBG: No results for input(s): GLUCAP in the last 168 hours. D-Dimer No results for input(s): DDIMER in the last 72 hours. Hgb A1c No results for input(s): HGBA1C in the last 72  hours. Lipid Profile No results for input(s): CHOL, HDL, LDLCALC, TRIG, CHOLHDL, LDLDIRECT in the last 72 hours. Thyroid function studies No results for input(s): TSH, T4TOTAL, T3FREE, THYROIDAB in the last 72 hours.  Invalid input(s): FREET3 Anemia work up Recent Labs    07/12/20 1919 07/13/20 0418  FERRITIN 156 128   Urinalysis    Component Value Date/Time   COLORURINE YELLOW (A) 02/15/2020 1036   APPEARANCEUR Hazy (A) 07/11/2020 1544   LABSPEC 1.016 02/15/2020 1036   PHURINE 5.0 02/15/2020 1036   GLUCOSEU Negative 07/11/2020 1544   HGBUR MODERATE (A) 02/15/2020 1036   BILIRUBINUR Negative 07/11/2020 1544   KETONESUR NEGATIVE 02/15/2020 1036   PROTEINUR 1+ (A) 07/11/2020 1544   PROTEINUR 30 (A) 02/15/2020 1036   NITRITE Negative 07/11/2020 1544   NITRITE NEGATIVE 02/15/2020 1036   LEUKOCYTESUR 2+ (A) 07/11/2020 1544   LEUKOCYTESUR LARGE (A) 02/15/2020 1036   Sepsis Labs Invalid input(s): PROCALCITONIN,  WBC,  LACTICIDVEN Microbiology Recent Results (from the past 240 hour(s))  Microscopic Examination     Status: Abnormal   Collection Time: 07/11/20  3:44 PM   Urine  Result Value Ref Range Status   WBC, UA 6-10 (A) 0 - 5 /hpf Final   RBC 3-10 (A) 0 - 2 /hpf Final   Epithelial Cells (non renal) 0-10 0 - 10 /hpf Final   Bacteria, UA Moderate (A) None seen/Few Final  Resp Panel by RT-PCR (Flu A&B, Covid) Nasopharyngeal Swab     Status: Abnormal   Collection Time: 07/12/20  1:43 PM   Specimen: Nasopharyngeal Swab; Nasopharyngeal(NP) swabs in vial transport medium  Result Value Ref Range Status   SARS Coronavirus 2 by RT PCR POSITIVE (A) NEGATIVE Final    Comment: RESULT CALLED TO, READ BACK BY AND VERIFIED WITH: GREG SCHNIER 07/12/20 1429 KLW (NOTE) SARS-CoV-2 target nucleic acids are DETECTED.  The SARS-CoV-2 RNA is generally detectable in upper respiratory specimens during the acute phase of infection. Positive results are indicative of the presence of the  identified virus, but do not rule out bacterial infection or co-infection with other pathogens not detected by the test. Clinical correlation with patient history and other diagnostic information is necessary to determine patient infection status. The expected result is Negative.  Fact Sheet for Patients: EntrepreneurPulse.com.au  Fact Sheet for Healthcare Providers: IncredibleEmployment.be  This test is not yet approved or cleared by the [place] FDA and  has been authorized for detection and/or diagnosis of SARS-CoV-2 by FDA under an Emergency Use Authorization (EUA).  This EUA will remain in effect (meaning this test can be [place] ed) for the duration of  the COVID-19 declaration under Section 564(b)(1) of the Act, 21 U.S.C. section 360bbb-3(b)(1), unless the authorization is terminated or revoked sooner.     Influenza A by PCR NEGATIVE NEGATIVE Final   Influenza B by PCR NEGATIVE NEGATIVE Final    Comment: (NOTE) The Xpert Xpress SARS-CoV-2/FLU/RSV plus assay is intended as an aid in the diagnosis of influenza from Nasopharyngeal swab specimens and should not be used as a sole basis for treatment. Nasal washings and aspirates are unacceptable for Xpert Xpress SARS-CoV-2/FLU/RSV testing.  Fact  Sheet for Patients: EntrepreneurPulse.com.au  Fact Sheet for Healthcare Providers: IncredibleEmployment.be  This test is not yet approved or cleared by the [place] FDA and has been authorized for detection and/or diagnosis of SARS-CoV-2 by FDA under an Emergency Use Authorization (EUA). This EUA will remain in effect (meaning this test can be used) for the duration of the COVID-19 declaration under Section 564(b)(1) of the Act, 21 U.S.C. section 360bbb-3(b)(1), unless the authorization is terminated or revoked.  Performed at [facility], [address] [place],  [address]       Total time spend on discharging this patient, including the last patient exam, discussing the hospital stay, instructions for ongoing care as it relates to all pertinent caregivers, as well as preparing the medical discharge records, prescriptions, and/or referrals as applicable, is 40 minutes.    Enzo Bi, MD  [facility] 07/13/2020, 11:33 AM

## 2020-07-13 NOTE — Progress Notes (Signed)
Patient seen in IR 07/12/20 for left PCN placement secondary to obstructing stone and hydronephrosis.   The patient's chart has been reviewed and I also spoke with the bedside nurse for an update. She is afebrile and has minimal pain/tenderness to the site. PCN output has been good (175 ml documented) and is pink-tinged. Labs are unremarkable.   IR will plan to see Victoria Peterson in one month as an outpatient for a routine catheter exchange. If she undergoes PCNL before that time and the catheter is removed she will not need to follow up with IR.  Please call IR with any questions/concerns.  Victoria Peterson, [place] [phone] 07/13/2020, 11:39 AM

## 2020-07-14 LAB — URINE CULTURE

## 2020-07-16 ENCOUNTER — Other Ambulatory Visit: Payer: Self-pay | Admitting: Radiology

## 2020-07-16 DIAGNOSIS — N200 Calculus of kidney: Secondary | ICD-10-CM

## 2020-07-19 ENCOUNTER — Other Ambulatory Visit: Payer: Self-pay | Admitting: Radiology

## 2020-07-22 ENCOUNTER — Other Ambulatory Visit: Payer: Self-pay | Admitting: Radiology

## 2020-07-22 ENCOUNTER — Telehealth: Payer: Self-pay

## 2020-07-22 NOTE — Telephone Encounter (Signed)
Called pt for more details. She states that when she went in they done emergency surgery due to infection. Pt states that when they tested her she came back positive for covid 12/10. Today is her 10 day she still has no symptoms at all. Pt was told that she needed to f/u with provider. Please advised how long she needs to wait to schedule/ if you need to see her.   Copied from [place]. Topic: General - Other >> Jul 22, 2020  9:29 AM Celene Kras wrote: Pt calling stating that after her surgery on 07/12/20 pt states that she was advised to have a f/u with PCP. She states however, that she tested positive for covid with no symptoms. Please advise.

## 2020-07-22 NOTE — Telephone Encounter (Signed)
OK to schedule this week or next

## 2020-07-22 NOTE — Telephone Encounter (Signed)
Incoming call from pt who states that the guaze have fallen off her neph tube and she is unsure about what she needs to do as she was not given instructions on neph tube care. Advised pt to purchase guaze and medical tape, to keep wound covered and protected. Sent information via mychart to patient on nephrostomy care. Pt denies, drainage from wound, pain, chills, and fever. Pt to follow up as scheduled.

## 2020-07-22 NOTE — Telephone Encounter (Signed)
scheduled

## 2020-07-30 ENCOUNTER — Encounter: Payer: Self-pay | Admitting: Family Medicine

## 2020-07-30 ENCOUNTER — Ambulatory Visit (INDEPENDENT_AMBULATORY_CARE_PROVIDER_SITE_OTHER): Payer: Medicare Other | Admitting: Family Medicine

## 2020-07-30 ENCOUNTER — Other Ambulatory Visit: Payer: Self-pay

## 2020-07-30 VITALS — BP 133/80 | HR 111 | Temp 98.0°F | Wt 160.6 lb

## 2020-07-30 DIAGNOSIS — N200 Calculus of kidney: Secondary | ICD-10-CM

## 2020-07-30 DIAGNOSIS — Z936 Other artificial openings of urinary tract status: Secondary | ICD-10-CM | POA: Diagnosis not present

## 2020-07-30 DIAGNOSIS — U071 COVID-19: Secondary | ICD-10-CM

## 2020-07-30 NOTE — Assessment & Plan Note (Signed)
Doing well. Was asymptomatic. Continue to monitor.

## 2020-07-30 NOTE — Progress Notes (Signed)
BP 133/80   Pulse (!) 111   Temp 98 F (36.7 C)   Wt 160 lb 9.6 oz (72.8 kg)   SpO2 98%   BMI 31.37 kg/m    Subjective:    Patient ID: Victoria Peterson, female    DOB: 1940/11/11, 79 y.o.   MRN: [id number]  HPI: Victoria Peterson is a 79 y.o. female  Chief Complaint  Patient presents with  . 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 She was sent to the hospital for a percutaneous nephrostomy tube She was sent home on bactrim. Unfortunately, she tested positive for COVID prior to her procedure. This has complicated her after care. She is scheduled to follow up with urology on 08/06/20 and to have a percutaneous nepthrolitomy on 08/19/20 to have the stone removed. She notes that her pain is much better. She has not had any coughing. No fever. No other concerns or complaints at this time.   Relevant past medical, surgical, family and social history reviewed and updated as indicated. Interim medical history since our last visit reviewed. Allergies and medications reviewed and updated.  Review of Systems  Constitutional: Negative.   Respiratory: Negative.   Cardiovascular: Negative.   Gastrointestinal: Negative.   Musculoskeletal: Negative.   Psychiatric/Behavioral: Negative.     Per HPI unless specifically indicated above     Objective:    BP 133/80   Pulse (!) 111   Temp 98 F (36.7 C)   Wt 160 lb 9.6 oz (72.8 kg)   SpO2 98%   BMI 31.37 kg/m   Wt Readings from Last 3 Encounters:  07/30/20 160 lb 9.6 oz (72.8 kg)  07/13/20 160 lb 0.9 oz (72.6 kg)  07/12/20 160 lb (72.6 kg)    Physical Exam Vitals and nursing note reviewed.  Constitutional:      General: She is not in acute distress.    Appearance: Normal appearance. She is not ill-appearing, toxic-appearing or diaphoretic.  HENT:     Head: Normocephalic and atraumatic.     Right Ear: External ear  normal.     Left Ear: External ear normal.     Nose: Nose normal.     Mouth/Throat:     Mouth: Mucous membranes are moist.     Pharynx: Oropharynx is clear.  Eyes:     General: No scleral icterus.       Right eye: No discharge.        Left eye: No discharge.     Extraocular Movements: Extraocular movements intact.     Conjunctiva/sclera: Conjunctivae normal.     Pupils: Pupils are equal, round, and reactive to light.  Cardiovascular:     Rate and Rhythm: Normal rate and regular rhythm.     Pulses: Normal pulses.     Heart sounds: Normal heart sounds. No murmur heard. No friction rub. No gallop.   Pulmonary:     Effort: Pulmonary effort is normal. No respiratory distress.     Breath sounds: Normal breath sounds. No stridor. No wheezing, rhonchi or rales.  Chest:     Chest wall: No tenderness.  Abdominal:     Comments: Nephrostomy tube in place  Musculoskeletal:        General: Normal range of motion.     Cervical back: Normal range of motion and neck supple.  Skin:  General: Skin is warm and dry.     Capillary Refill: Capillary refill takes less than 2 seconds.     Coloration: Skin is not jaundiced or pale.     Findings: No bruising, erythema, lesion or rash.  Neurological:     General: No focal deficit present.     Mental Status: She is alert and oriented to person, place, and time. Mental status is at baseline.  Psychiatric:        Mood and Affect: Mood normal.        Behavior: Behavior normal.        Thought Content: Thought content normal.        Judgment: Judgment normal.     Results for orders placed or performed during the hospital encounter of 16/10/96  Basic metabolic panel  Result Value Ref Range   Sodium 136 135 - 145 mmol/L   Potassium 4.4 3.5 - 5.1 mmol/L   Chloride 100 98 - 111 mmol/L   CO2 24 22 - 32 mmol/L   Glucose, Bld 97 70 - 99 mg/dL   BUN 15 8 - 23 mg/dL   Creatinine, Ser 0.89 0.44 - 1.00 mg/dL   Calcium 8.9 8.9 - 10.3 mg/dL   GFR,  Estimated >60 >60 mL/min   Anion gap 12 5 - 15  CBC with Differential/Platelet  Result Value Ref Range   WBC 4.9 4.0 - 10.5 K/uL   RBC 4.55 3.87 - 5.11 MIL/uL   Hemoglobin 12.1 12.0 - 15.0 g/dL   HCT 36.7 36.0 - 46.0 %   MCV 80.7 80.0 - 100.0 fL   MCH 26.6 26.0 - 34.0 pg   MCHC 33.0 30.0 - 36.0 g/dL   RDW 14.4 11.5 - 15.5 %   Platelets 217 150 - 400 K/uL   nRBC 0.0 0.0 - 0.2 %   Neutrophils Relative % 72 %   Neutro Abs 3.6 1.7 - 7.7 K/uL   Lymphocytes Relative 21 %   Lymphs Abs 1.0 0.7 - 4.0 K/uL   Monocytes Relative 7 %   Monocytes Absolute 0.3 0.1 - 1.0 K/uL   Eosinophils Relative 0 %   Eosinophils Absolute 0.0 0.0 - 0.5 K/uL   Basophils Relative 0 %   Basophils Absolute 0.0 0.0 - 0.1 K/uL   Immature Granulocytes 0 %   Abs Immature Granulocytes 0.02 0.00 - 0.07 K/uL  C-reactive protein  Result Value Ref Range   CRP 10.6 (H) <1.0 mg/dL  Ferritin  Result Value Ref Range   Ferritin 156 11 - 307 ng/mL  Fibrinogen  Result Value Ref Range   Fibrinogen >750 (H) 210 - 475 mg/dL  Lactate dehydrogenase  Result Value Ref Range   LDH 202 (H) 98 - 192 U/L  CBC with Differential/Platelet  Result Value Ref Range   WBC 3.9 (L) 4.0 - 10.5 K/uL   RBC 4.20 3.87 - 5.11 MIL/uL   Hemoglobin 10.9 (L) 12.0 - 15.0 g/dL   HCT 33.8 (L) 36.0 - 46.0 %   MCV 80.5 80.0 - 100.0 fL   MCH 26.0 26.0 - 34.0 pg   MCHC 32.2 30.0 - 36.0 g/dL   RDW 14.5 11.5 - 15.5 %   Platelets 219 150 - 400 K/uL   nRBC 0.0 0.0 - 0.2 %   Neutrophils Relative % 61 %   Neutro Abs 2.4 1.7 - 7.7 K/uL   Lymphocytes Relative 30 %   Lymphs Abs 1.2 0.7 - 4.0 K/uL   Monocytes Relative 8 %  Monocytes Absolute 0.3 0.1 - 1.0 K/uL   Eosinophils Relative 0 %   Eosinophils Absolute 0.0 0.0 - 0.5 K/uL   Basophils Relative 0 %   Basophils Absolute 0.0 0.0 - 0.1 K/uL   Immature Granulocytes 1 %   Abs Immature Granulocytes 0.02 0.00 - 0.07 K/uL  Magnesium  Result Value Ref Range   Magnesium 2.1 1.7 - 2.4 mg/dL   Comprehensive metabolic panel  Result Value Ref Range   Sodium 132 (L) 135 - 145 mmol/L   Potassium 4.2 3.5 - 5.1 mmol/L   Chloride 101 98 - 111 mmol/L   CO2 21 (L) 22 - 32 mmol/L   Glucose, Bld 103 (H) 70 - 99 mg/dL   BUN 19 8 - 23 mg/dL   Creatinine, Ser 0.84 0.44 - 1.00 mg/dL   Calcium 8.7 (L) 8.9 - 10.3 mg/dL   Total Protein 6.9 6.5 - 8.1 g/dL   Albumin 3.0 (L) 3.5 - 5.0 g/dL   AST 40 15 - 41 U/L   ALT 27 0 - 44 U/L   Alkaline Phosphatase 43 38 - 126 U/L   Total Bilirubin 1.0 0.3 - 1.2 mg/dL   GFR, Estimated >60 >60 mL/min   Anion gap 10 5 - 15  C-reactive protein  Result Value Ref Range   CRP 9.5 (H) <1.0 mg/dL  Ferritin  Result Value Ref Range   Ferritin 128 11 - 307 ng/mL  Fibrinogen  Result Value Ref Range   Fibrinogen 705 (H) 210 - 475 mg/dL  Lactate dehydrogenase  Result Value Ref Range   LDH 183 98 - 192 U/L      Assessment & Plan:   Problem List Items Addressed This Visit      Other   COVID-19 virus infection    Doing well. Was asymptomatic. Continue to monitor.        Other Visit Diagnoses    Staghorn calculus    -  Primary   Doing better since having the nephrostomy tube. Feeling better. Continue to monitor. To have lithostomy done in about 3 weeks.    Nephrostomy status ([place])       Temporary until calculus resolved. Doing well. Continue to follow with urology. Call with any concerns.        Follow up plan: Return as scheduled.   >20 minutes spent with patient today.

## 2020-08-03 DIAGNOSIS — G5603 Carpal tunnel syndrome, bilateral upper limbs: Secondary | ICD-10-CM

## 2020-08-03 HISTORY — DX: Carpal tunnel syndrome, bilateral upper limbs: G56.03

## 2020-08-06 ENCOUNTER — Ambulatory Visit: Payer: Medicare Other | Admitting: Urology

## 2020-08-06 ENCOUNTER — Other Ambulatory Visit: Payer: Self-pay

## 2020-08-06 VITALS — BP 120/70 | HR 83

## 2020-08-06 DIAGNOSIS — N200 Calculus of kidney: Secondary | ICD-10-CM

## 2020-08-06 NOTE — Patient Instructions (Signed)
Percutaneous Nephrolithotomy Percutaneous nephrolithotomy is a procedure to remove kidney stones. Kidney stones are deposits that form inside your kidneys and can cause pain. You may need this procedure if:  You have large kidney stones. Kidney stones that are bigger than 2 cm (0.78 in.) wide may require this procedure.  Your kidney stones are oddly shaped.  Other treatments have not been successful in helping the kidney stones to pass.  You have developed an infection due to the kidney stones. Tell a health care provider about:  Any allergies you have.  All medicines you are taking, including vitamins, herbs, eye drops, creams, and over-the-counter medicines.  Any problems you or family members have had with anesthetic medicines.  Any blood disorders you have.  Any surgeries you have had.  Any medical conditions you have.  Whether you are pregnant or may be pregnant.  Whether you use any tobacco products, including cigarettes, chewing tobacco, or e-cigarettes. What are the risks? Generally, this is a safe procedure. However, problems may occur, including:  Infection.  Bleeding. This may include blood in your urine.  Allergic reactions to medicines.  Damage to other structures or organs.  Kidney damage.  Holes in the kidney. These often heal on their own.  Numbness or tingling in the affected area.  Inability to remove all the stones. You may need a different procedure to complete treatment. What happens before the procedure? Staying hydrated Follow instructions from your health care provider about hydration, which may include:  Up to 2 hours before the procedure - you may continue to drink clear liquids, such as water, clear fruit juice, black coffee, and plain tea.  Eating and drinking restrictions Follow instructions from your health care provider about eating and drinking, which may include:  8 hours before the procedure - stop eating heavy meals or foods,  such as meat, fried foods, or fatty foods.  6 hours before the procedure - stop eating light meals or foods, such as toast or cereal.  6 hours before the procedure - stop drinking milk or drinks that contain milk.  2 hours before the procedure - stop drinking clear liquids. Medicines Ask your health care provider about:  Changing or stopping your regular medicines. This is especially important if you are taking diabetes medicines or blood thinners.  Taking medicines such as aspirin and ibuprofen. These medicines can thin your blood. Do not take these medicines unless your health care provider tells you to take them.  Taking over-the-counter medicines, vitamins, herbs, and supplements. Tests You may have tests, including:  Blood tests.  Urine tests.  Tests to check how your heart is working.  Imaging studies. These are used to identify: ? The size and number (stone burden) of the kidney stones. ? The position of the kidney stones. General instructions  Plan to have someone take you home from the hospital or clinic.  Plan to have a responsible adult care for you for at least 24 hours after you leave the hospital or clinic. This is important.  Ask your health care provider how your surgical site will be marked or identified.  Ask your health care provider what steps will be taken to help prevent infection. These may include: ? Removing hair at the surgery site. ? Washing skin with a germ-killing soap. ? Taking antibiotic medicine. What happens during the procedure?   An IV will be inserted into one of your veins.  The site of the procedure will be marked.  You will be  given one or more of the following: ? A medicine to help you relax (sedative). ? A medicine to numb the area (local anesthetic). ? A medicine to make you fall asleep (general anesthetic). ? A medicine that is injected into your spine to numb the area below and slightly above the injection site (spinal  anesthetic). ? A medicine that is injected into an area of your body to numb everything below the injection site (regional anesthetic).  A thin tube (urinary catheter) will be put in your bladder to drain urine during and after the procedure.  Your surgeon will make a small cut (incision) in your lower back.  A tube will be inserted through the incision into your kidney.  Each kidney stone will be removed through this tube. Larger stones may need to be broken up with a high-intensity light beam (laser) or other tools.  After all of the stones have been removed, your health care provider may put in tubes to drain your bladder. Based on your condition: ? An internal tube, called a stent, may be put in your ureter. This will help drain urine from your kidney to your bladder. ? A surgical drain (nephrostomy tube) may be put in your kidney. The tube comes out through the incision in your lower back. This will help to drain urine or any fluid that builds up while your kidney heals.  Part of the incision may be closed with stitches (sutures).  A bandage (dressing) will be placed over the incision area. The procedure may vary among health care providers and hospitals. What happens after the procedure?  Your blood pressure, heart rate, breathing rate, and blood oxygen level will be monitored until you leave the hospital or clinic.  You may be given medicine for pain.  You will be shown how to do breathing exercises, such as coughing and breathing deeply. These will help to prevent pneumonia.  You will be encouraged to walk. Walking helps to prevent blood clots.  Your stent and urinary catheter will be removed after 1-2 days if there is only a small amount of blood in your urine.  You will be taught how to care for the catheter or nephrostomy tube, if you have them.  Do not drive for 24 hours if you were given a sedative during your procedure. Summary  Percutaneous nephrolithotomy is a  procedure to remove kidney stones.  Ask your health care provider about changing or stopping your regular medicines.  Before surgery, follow instructions from your health care provider about eating and drinking.  Plan to have someone take you home from the hospital or clinic. This information is not intended to replace advice given to you by your health care provider. Make sure you discuss any questions you have with your health care provider. Document Revised: 09/15/2018 Document Reviewed: 02/09/2018 Elsevier Patient Education  [place].

## 2020-08-07 LAB — URINALYSIS, COMPLETE
Bilirubin, UA: NEGATIVE
Nitrite, UA: POSITIVE — AB
Specific Gravity, UA: <1.005 — ABNORMAL LOW (ref 1.005–1.030)
Urobilinogen, Ur: 1 mg/dL (ref 0.2–1.0)
pH, UA: 5 (ref 5.0–7.5)

## 2020-08-07 LAB — MICROSCOPIC EXAMINATION
Epithelial Cells (non renal): NONE SEEN /hpf (ref 0–10)
RBC, Urine: >30 /hpf — ABNORMAL HIGH (ref 0–2)
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
## 2020-08-09 ENCOUNTER — Encounter
Admission: RE | Admit: 2020-08-09 | Discharge: 2020-08-09 | Disposition: A | Discharge status: Home / Self Care | Payer: Medicare Other | Source: Ambulatory Visit | Attending: Urology | Admitting: Urology

## 2020-08-09 ENCOUNTER — Other Ambulatory Visit: Payer: Self-pay

## 2020-08-09 HISTORY — DX: Anemia, unspecified: D64.9

## 2020-08-09 HISTORY — DX: Unspecified hydronephrosis: N13.30

## 2020-08-09 HISTORY — DX: Restless legs syndrome: G25.81

## 2020-08-09 HISTORY — DX: Radiculopathy, lumbar region: M54.16

## 2020-08-09 NOTE — Patient Instructions (Addendum)
INSTRUCTIONS FOR SURGERY     Your surgery is scheduled for:   Monday, January 17TH     To find out your arrival time for the day of surgery,          please call [phone] between 1 pm and 3 pm on :  Friday, January 14TH     When you arrive for surgery, report to [facility]. Once you have been registered, go      To the second floor and sign in at the surgery desk.     REMEMBER: Instructions that are not followed completely may result in serious medical risk,  up to and including death, or upon the discretion of your surgeon and anesthesiologist,            your surgery may need to be rescheduled.  __X__ 1. Do not eat food after midnight the night before your procedure.                    No gum, candy, lozenger, tic tacs, tums or hard candies.                  ABSOLUTELY NOTHING SOLID IN YOUR MOUTH AFTER MIDNIGHT                    You may drink unlimited clear liquids up to 2 hours before you are scheduled to arrive for surgery.                   Do not drink anything within those 2 hours unless you need to take medicine, then take the                   smallest amount you need.  Clear liquids include:  water, apple juice without pulp,                   any flavor Gatorade, Black coffee, black tea.  Sugar may be added but no dairy/ honey /lemon.                        Broth and jello is not considered a clear liquid.  __x__  2. On the morning of surgery, please brush your teeth with toothpaste and water. You may rinse with                  mouthwash if you wish but DO NOT SWALLOW TOOTHPASTE OR MOUTHWASH  __X___3. NO alcohol for 24 hours before or after surgery.  __x___ 4.  Do NOT smoke or use e-cigarettes for 24 HOURS PRIOR TO SURGERY.                      DO NOT Use any chewable tobacco products for at least 6 hours prior to surgery.  __x___ 5. If you start any new medication after this appointment and prior to  surgery, please                   Bring it with you on the day of surgery.  ___x__ 6. Notify your doctor if there  is any change in your medical condition, such as fever,                  infection, vomitting, diarrhea or any open sores.  __x___ 7.  USE the CHG SAGE WIPES as instructed, the night before surgery and the day of surgery.                   Once you have washed with these wipes, do NOT use any of the following: Powders, perfumes                    or lotions. Please do not wear make up, hairpins, clips or nail polish. You may wear deodorant.                   Men may shave their face and neck.  Women need to shave 48 hours prior to surgery.                   DO NOT wear ANY jewelry on the day of surgery. If there are rings that are too tight to                    remove easily, please address this prior to the surgery day. Piercings need to be removed.                                                                     NO METAL ON YOUR BODY.                    Do NOT bring any valuables.  If you came to Pre-Admit testing then you will not need license,                     insurance card or credit card.  If you will be staying overnight, please either leave your things in                     the car or have your family be responsible for these items.                     [facility] IS NOT RESPONSIBLE FOR BELONGINGS OR VALUABLES.  ___X__ 8. DO NOT wear contact lenses on surgery day.  You may not have dentures,                     Hearing aides, contacts or glasses in the operating room. These items can be                    Placed in the Recovery Room to receive immediately after surgery.  __x___ 9. IF YOU ARE SCHEDULED TO GO HOME ON THE SAME DAY, YOU MUST                   Have someone to drive you home and to stay with you  for the first 24 hours.                    Have an arrangement prior to arriving on surgery day.  ___x__ 10. Take  the following medications on the morning of  surgery with a sip of water:                              1. NOTHING                     2..  _____ 11.  Follow any instructions provided to you by your surgeon.                        Such as enema, clear liquid bowel prep  __X__  12. STOP ALL ASPIRIN PRODUCTS AS OF January 10TH                       THIS INCLUDES BC POWDERS / GOODIES POWDER  __x___ 13. STOP Anti-inflammatories as of January 10TH                      This includes IBUPROFEN / MOTRIN / ADVIL / ALEVE/ NAPROXYN                    YOU MAY TAKE TYLENOL ANY TIME PRIOR TO SURGERY.  __X__ 14.  Stop supplements until after surgery.                     This includes:MULTIVITAMINS // CALCIUM + D + POTASSIUM                 ___X___18. If staying overnight, please have appropriate shoes to wear to be able to walk around the unit.                   Wear clean and comfortable clothing to the hospital.  [facility] CELL PHONE.  ALSO, BRING PHONE NUMBERS FOR YOUR CONTACTS.

## 2020-08-10 LAB — CULTURE, URINE COMPREHENSIVE

## 2020-08-13 ENCOUNTER — Other Ambulatory Visit: Payer: Self-pay | Admitting: Radiology

## 2020-08-13 DIAGNOSIS — N390 Urinary tract infection, site not specified: Secondary | ICD-10-CM

## 2020-08-13 DIAGNOSIS — B3749 Other urogenital candidiasis: Secondary | ICD-10-CM

## 2020-08-13 MED ORDER — CIPROFLOXACIN HCL 500 MG PO TABS: 500.0000 mg | ORAL_TABLET | Freq: Two times a day (BID) | ORAL | 0 refills | Status: DC

## 2020-08-13 MED ORDER — FLUCONAZOLE 200 MG PO TABS: 200.0000 mg | ORAL_TABLET | Freq: Every day | ORAL | 0 refills | Status: DC

## 2020-08-13 NOTE — Progress Notes (Signed)
Notified patient of script sent to pharmacy.

## 2020-08-14 ENCOUNTER — Other Ambulatory Visit: Payer: Self-pay

## 2020-08-14 ENCOUNTER — Other Ambulatory Visit
Admission: RE | Admit: 2020-08-14 | Discharge: 2020-08-14 | Disposition: A | Discharge status: Home / Self Care | Payer: Medicare Other | Source: Ambulatory Visit | Attending: Urology | Admitting: Urology

## 2020-08-14 DIAGNOSIS — Z01818 Encounter for other preprocedural examination: Secondary | ICD-10-CM | POA: Insufficient documentation

## 2020-08-14 DIAGNOSIS — Z136 Encounter for screening for cardiovascular disorders: Secondary | ICD-10-CM | POA: Diagnosis not present

## 2020-08-14 LAB — PROTIME-INR
INR: 1 (ref 0.8–1.2)
Prothrombin Time: 13.2 seconds (ref 11.4–15.2)

## 2020-08-14 LAB — CBC
HCT: 41.8 % (ref 36.0–46.0)
Hemoglobin: 13.1 g/dL (ref 12.0–15.0)
MCH: 26.3 pg (ref 26.0–34.0)
MCHC: 31.3 g/dL (ref 30.0–36.0)
MCV: 83.8 fL (ref 80.0–100.0)
Platelets: 301 10*3/uL (ref 150–400)
RBC: 4.99 MIL/uL (ref 3.87–5.11)
RDW: 16 % — ABNORMAL HIGH (ref 11.5–15.5)
WBC: 5.5 10*3/uL (ref 4.0–10.5)
nRBC: 0 % (ref 0.0–0.2)

## 2020-08-14 LAB — BASIC METABOLIC PANEL
Anion gap: 11 (ref 5–15)
BUN: 15 mg/dL (ref 8–23)
CO2: 25 mmol/L (ref 22–32)
Calcium: 9.6 mg/dL (ref 8.9–10.3)
Chloride: 107 mmol/L (ref 98–111)
Creatinine, Ser: 0.66 mg/dL (ref 0.44–1.00)
GFR, Estimated: >60 mL/min (ref >60)
Glucose, Bld: 105 mg/dL — ABNORMAL HIGH (ref 70–99)
Potassium: 4 mmol/L (ref 3.5–5.1)
Sodium: 143 mmol/L (ref 135–145)

## 2020-08-14 LAB — TYPE AND SCREEN
ABO/RH(D): B POS
Antibody Screen: NEGATIVE

## 2020-08-16 ENCOUNTER — Other Ambulatory Visit: Payer: Self-pay | Admitting: Radiology

## 2020-08-16 NOTE — Progress Notes (Addendum)
  [facility] Perioperative Services: Pre-Admission/Anesthesia Testing     Date: 08/16/20  Name: Victoria Peterson MRN:   [id number]  Re: Review of preoperative ABX; ??? hypotension with cefazolin   Patient scheduled for PCN tube placement on 08/19/2020 with Dr. Hollice Espy.  In review of her medical record for preanesthesia clearance, it was noted that patient has a documented allergy to cefazolin.  Upon further review, it was noted that patient received this medication following a TKA back in 10/2019.  Records indicate the patient did fine with preoperative dose, however subsequent doses during admission resulted in hypotensive episodes on two occasions.  Note from [facility] provider Mortimer Fries, MD) on 10/11/2019 indicates that hypotensive episodes coincided with administration of cefazolin. Dr. Mortimer Fries requested that medication be added to the patient's allergy list. Cefazolin at that time was discontinued and another antibiotic was ordered.  Reviewed the above information with primary attending surgeon Erlene Quan, MD) to determine need for therapeutic change.  Per MD, plans are to discuss with anesthesia on the morning of surgery as she is not convinced that cefazolin was contributory to the patient's hypotensive episodes.  No changes made to preoperative prophylactic antibiotic orders at this time.  MD to follow-up prior to the medication being administered on day of surgery.   Note being entered in patient's chart, and communication is being placed on paper chart, for review by [facility] staff and anesthesia to ensure that everyone is aware of the above noted plans.   Honor Loh, MSN, APRN, FNP-C, CEN [facility]  Peri-operative Services Nurse Practitioner Phone: [phone] 08/16/20 1:26 PM

## 2020-08-19 ENCOUNTER — Observation Stay
Admission: RE | Admit: 2020-08-19 | Discharge: 2020-08-20 | Disposition: A | Discharge status: Home / Self Care | Payer: Medicare Other | Attending: Urology | Admitting: Urology

## 2020-08-19 ENCOUNTER — Encounter
Admission: RE | Disposition: A | Discharge status: Home / Self Care | Payer: Self-pay | Source: Home / Self Care | Attending: Urology

## 2020-08-19 ENCOUNTER — Ambulatory Visit: Payer: Medicare Other | Admitting: Urgent Care

## 2020-08-19 ENCOUNTER — Encounter: Payer: Self-pay | Admitting: Urology

## 2020-08-19 ENCOUNTER — Other Ambulatory Visit: Payer: Self-pay

## 2020-08-19 ENCOUNTER — Ambulatory Visit
Admission: RE | Admit: 2020-08-19 | Discharge: 2020-08-19 | Disposition: A | Discharge status: Home / Self Care | Payer: Medicare Other | Source: Ambulatory Visit | Attending: Urology | Admitting: Urology

## 2020-08-19 ENCOUNTER — Ambulatory Visit: Payer: Medicare Other

## 2020-08-19 DIAGNOSIS — M5003 Cervical disc disorder with myelopathy, cervicothoracic region: Secondary | ICD-10-CM | POA: Diagnosis not present

## 2020-08-19 DIAGNOSIS — N200 Calculus of kidney: Secondary | ICD-10-CM | POA: Diagnosis not present

## 2020-08-19 DIAGNOSIS — G5603 Carpal tunnel syndrome, bilateral upper limbs: Secondary | ICD-10-CM | POA: Diagnosis not present

## 2020-08-19 HISTORY — PX: NEPHROLITHOTOMY: SHX5134

## 2020-08-19 SURGERY — NEPHROLITHOTOMY PERCUTANEOUS
Anesthesia: General | Laterality: Left

## 2020-08-19 MED ORDER — SODIUM CHLORIDE 0.9 % IV SOLN
INTRAVENOUS | Status: DC | PRN
  Administered 2020-08-19: 10 ug/min via INTRAVENOUS

## 2020-08-19 MED ORDER — OXYBUTYNIN CHLORIDE 5 MG PO TABS: 5.0000 mg | ORAL_TABLET | Freq: Three times a day (TID) | ORAL | Status: DC | PRN

## 2020-08-19 MED ORDER — PROPOFOL 10 MG/ML IV BOLUS
INTRAVENOUS | Status: DC | PRN
  Administered 2020-08-19: 150 mg via INTRAVENOUS

## 2020-08-19 MED ORDER — ONDANSETRON HCL 4 MG/2ML IJ SOLN
INTRAMUSCULAR | Status: DC | PRN
  Administered 2020-08-19: 4 mg via INTRAVENOUS

## 2020-08-19 MED ORDER — CHLORHEXIDINE GLUCONATE 0.12 % MT SOLN
OROMUCOSAL | Status: AC
  Administered 2020-08-19: 15 mL via OROMUCOSAL
  Filled 2020-08-19: qty 15

## 2020-08-19 MED ORDER — OXYCODONE-ACETAMINOPHEN 5-325 MG PO TABS: 1.0000 | ORAL_TABLET | ORAL | Status: DC | PRN

## 2020-08-19 MED ORDER — CIPROFLOXACIN HCL 500 MG PO TABS: 500.0000 mg | ORAL_TABLET | Freq: Two times a day (BID) | ORAL | Status: DC

## 2020-08-19 MED ORDER — ONDANSETRON HCL 4 MG/2ML IJ SOLN
INTRAMUSCULAR | Status: AC
  Filled 2020-08-19: qty 2

## 2020-08-19 MED ORDER — ONDANSETRON HCL 4 MG/2ML IJ SOLN
4.0000 mg | INTRAMUSCULAR | Status: DC | PRN
  Administered 2020-08-19: 4 mg via INTRAVENOUS

## 2020-08-19 MED ORDER — PHENYLEPHRINE HCL (PRESSORS) 10 MG/ML IV SOLN
INTRAVENOUS | Status: DC | PRN
  Administered 2020-08-19: 200 ug via INTRAVENOUS
  Administered 2020-08-19 (×3): 100 ug via INTRAVENOUS
  Administered 2020-08-19: 200 ug via INTRAVENOUS

## 2020-08-19 MED ORDER — ALBUMIN HUMAN 5 % IV SOLN
INTRAVENOUS | Status: AC
  Filled 2020-08-19: qty 250

## 2020-08-19 MED ORDER — DIPHENHYDRAMINE HCL 50 MG/ML IJ SOLN: 12.5000 mg | Freq: Four times a day (QID) | INTRAMUSCULAR | Status: DC | PRN

## 2020-08-19 MED ORDER — ROPINIROLE HCL 0.25 MG PO TABS
0.5000 mg | ORAL_TABLET | Freq: Every day | ORAL | Status: DC
  Filled 2020-08-19 (×2): qty 2

## 2020-08-19 MED ORDER — OXYCODONE HCL 5 MG PO TABS: 5.0000 mg | ORAL_TABLET | Freq: Once | ORAL | Status: DC | PRN

## 2020-08-19 MED ORDER — FAMOTIDINE 20 MG PO TABS: 20.0000 mg | ORAL_TABLET | Freq: Once | ORAL | Status: AC

## 2020-08-19 MED ORDER — SODIUM CHLORIDE 0.9 % IR SOLN
Status: DC | PRN
Start: 2020-08-19 — End: 2020-08-19

## 2020-08-19 MED ORDER — DIPHENHYDRAMINE HCL 12.5 MG/5ML PO ELIX
12.5000 mg | ORAL_SOLUTION | Freq: Four times a day (QID) | ORAL | Status: DC | PRN
  Filled 2020-08-19: qty 5

## 2020-08-19 MED ORDER — CEFAZOLIN SODIUM-DEXTROSE 2-4 GM/100ML-% IV SOLN
INTRAVENOUS | Status: AC
  Filled 2020-08-19: qty 100

## 2020-08-19 MED ORDER — LIDOCAINE HCL (PF) 2 % IJ SOLN
INTRAMUSCULAR | Status: AC
  Filled 2020-08-19: qty 5

## 2020-08-19 MED ORDER — HEPARIN SODIUM (PORCINE) 5000 UNIT/ML IJ SOLN: 5000.0000 [IU] | Freq: Three times a day (TID) | INTRAMUSCULAR | Status: DC

## 2020-08-19 MED ORDER — FLUCONAZOLE 100 MG PO TABS
200.0000 mg | ORAL_TABLET | Freq: Every day | ORAL | Status: DC
  Administered 2020-08-20: 200 mg via ORAL
  Filled 2020-08-19: qty 2

## 2020-08-19 MED ORDER — FENTANYL CITRATE (PF) 100 MCG/2ML IJ SOLN
INTRAMUSCULAR | Status: AC
  Administered 2020-08-19: 25 ug via INTRAVENOUS
  Filled 2020-08-19: qty 2

## 2020-08-19 MED ORDER — WHITE PETROLATUM EX OINT
TOPICAL_OINTMENT | CUTANEOUS | Status: AC
  Filled 2020-08-19: qty 5

## 2020-08-19 MED ORDER — HEPARIN SODIUM (PORCINE) 5000 UNIT/ML IJ SOLN
INTRAMUSCULAR | Status: AC
  Administered 2020-08-19: 5000 [IU] via SUBCUTANEOUS
  Filled 2020-08-19: qty 1

## 2020-08-19 MED ORDER — SODIUM CHLORIDE 0.9 % IV SOLN: INTRAVENOUS | Status: DC

## 2020-08-19 MED ORDER — LIDOCAINE HCL (CARDIAC) PF 100 MG/5ML IV SOSY
PREFILLED_SYRINGE | INTRAVENOUS | Status: DC | PRN
  Administered 2020-08-19: 80 mg via INTRAVENOUS

## 2020-08-19 MED ORDER — ROCURONIUM BROMIDE 100 MG/10ML IV SOLN
INTRAVENOUS | Status: DC | PRN
  Administered 2020-08-19: 50 mg via INTRAVENOUS
  Administered 2020-08-19 (×2): 10 mg via INTRAVENOUS
  Administered 2020-08-19: 30 mg via INTRAVENOUS
  Administered 2020-08-19: 10 mg via INTRAVENOUS

## 2020-08-19 MED ORDER — CHLORHEXIDINE GLUCONATE 0.12 % MT SOLN: 15.0000 mL | Freq: Once | OROMUCOSAL | Status: AC

## 2020-08-19 MED ORDER — FENTANYL CITRATE (PF) 100 MCG/2ML IJ SOLN
25.0000 ug | INTRAMUSCULAR | Status: DC | PRN
  Administered 2020-08-19 (×3): 25 ug via INTRAVENOUS

## 2020-08-19 MED ORDER — ACETAMINOPHEN 325 MG PO TABS: 650.0000 mg | ORAL_TABLET | ORAL | Status: DC | PRN

## 2020-08-19 MED ORDER — FENTANYL CITRATE (PF) 100 MCG/2ML IJ SOLN
INTRAMUSCULAR | Status: AC
  Filled 2020-08-19: qty 2

## 2020-08-19 MED ORDER — FLUCONAZOLE 100 MG PO TABS: 200.0000 mg | ORAL_TABLET | Freq: Every day | ORAL | Status: DC

## 2020-08-19 MED ORDER — DEXMEDETOMIDINE (PRECEDEX) IN NS 20 MCG/5ML (4 MCG/ML) IV SYRINGE
PREFILLED_SYRINGE | INTRAVENOUS | Status: AC
  Filled 2020-08-19: qty 5

## 2020-08-19 MED ORDER — BELLADONNA ALKALOIDS-OPIUM 16.2-60 MG RE SUPP
1.0000 | Freq: Four times a day (QID) | RECTAL | Status: DC | PRN
Start: 2020-08-19 — End: 2020-08-20

## 2020-08-19 MED ORDER — FAMOTIDINE 20 MG PO TABS
ORAL_TABLET | ORAL | Status: AC
  Administered 2020-08-19: 20 mg via ORAL
  Filled 2020-08-19: qty 1

## 2020-08-19 MED ORDER — DEXAMETHASONE SODIUM PHOSPHATE 10 MG/ML IJ SOLN
INTRAMUSCULAR | Status: DC | PRN
  Administered 2020-08-19: 10 mg via INTRAVENOUS

## 2020-08-19 MED ORDER — DOCUSATE SODIUM 100 MG PO CAPS
100.0000 mg | ORAL_CAPSULE | Freq: Two times a day (BID) | ORAL | Status: DC
  Administered 2020-08-19 – 2020-08-20 (×2): 100 mg via ORAL
  Filled 2020-08-19 (×3): qty 1

## 2020-08-19 MED ORDER — OXYCODONE HCL 5 MG/5ML PO SOLN: 5.0000 mg | Freq: Once | ORAL | Status: DC | PRN

## 2020-08-19 MED ORDER — CIPROFLOXACIN HCL 500 MG PO TABS
ORAL_TABLET | ORAL | Status: AC
  Filled 2020-08-19: qty 1

## 2020-08-19 MED ORDER — IOHEXOL 180 MG/ML  SOLN
INTRAMUSCULAR | Status: DC | PRN
  Administered 2020-08-19: 40 mL
  Administered 2020-08-19: 80 mL
  Administered 2020-08-19: 20 mL

## 2020-08-19 MED ORDER — ROCURONIUM BROMIDE 10 MG/ML (PF) SYRINGE
PREFILLED_SYRINGE | INTRAVENOUS | Status: AC
  Filled 2020-08-19: qty 10

## 2020-08-19 MED ORDER — PROPOFOL 10 MG/ML IV BOLUS
INTRAVENOUS | Status: AC
  Filled 2020-08-19: qty 20

## 2020-08-19 MED ORDER — MORPHINE SULFATE (PF) 4 MG/ML IV SOLN: 2.0000 mg | INTRAVENOUS | Status: DC | PRN

## 2020-08-19 MED ORDER — ORAL CARE MOUTH RINSE: 15.0000 mL | Freq: Once | OROMUCOSAL | Status: AC

## 2020-08-19 MED ORDER — LACTATED RINGERS IV SOLN: INTRAVENOUS | Status: DC

## 2020-08-19 MED ORDER — DEXAMETHASONE SODIUM PHOSPHATE 10 MG/ML IJ SOLN
INTRAMUSCULAR | Status: AC
  Filled 2020-08-19: qty 1

## 2020-08-19 MED ORDER — DEXMEDETOMIDINE (PRECEDEX) IN NS 20 MCG/5ML (4 MCG/ML) IV SYRINGE
PREFILLED_SYRINGE | INTRAVENOUS | Status: DC | PRN
  Administered 2020-08-19: 4 ug via INTRAVENOUS

## 2020-08-19 MED ORDER — FENTANYL CITRATE (PF) 100 MCG/2ML IJ SOLN
INTRAMUSCULAR | Status: DC | PRN
  Administered 2020-08-19 (×2): 50 ug via INTRAVENOUS

## 2020-08-19 MED ORDER — CEFAZOLIN SODIUM-DEXTROSE 2-3 GM-%(50ML) IV SOLR
INTRAVENOUS | Status: DC | PRN
  Administered 2020-08-19: 2 g via INTRAVENOUS

## 2020-08-19 MED ORDER — ALBUMIN HUMAN 5 % IV SOLN: INTRAVENOUS | Status: DC | PRN

## 2020-08-19 SURGICAL SUPPLY — 69 items
ADAPTER IRRIG TUBE 2 SPIKE SOL (ADAPTER) ×4 IMPLANT
ADAPTER SCOPE UROLOK II (MISCELLANEOUS) ×2 IMPLANT
BAG URINE DRAIN 2000ML AR STRL (UROLOGICAL SUPPLIES) ×2 IMPLANT
BALLN NEPHROSTOMY 10X15 (UROLOGICAL SUPPLIES) ×2 IMPLANT
BASKET ZERO TIP 1.9FR (BASKET) ×1 IMPLANT
BLADE SURG 15 STRL LF DISP TIS (BLADE) ×1 IMPLANT
BLADE SURG 15 STRL SS (BLADE) ×2
CATH COUNCIL 22FR (CATHETERS) IMPLANT
CATH FOLEY 2W COUNCIL 20FR 5CC (CATHETERS) ×1 IMPLANT
CATH FOLEY 2W COUNCIL 5CC 18FR (CATHETERS) IMPLANT
CATH STENT KAYE NEPHR TAMP (CATHETERS) IMPLANT
CATH TORCON 5FR 0.38 (CATHETERS) ×1 IMPLANT
CATH URETL 5X70 OPEN END (CATHETERS) ×2 IMPLANT
CATH/STENT KAYE NEPHR TAMP (CATHETERS)
CHLORAPREP W/TINT 26 (MISCELLANEOUS) ×2 IMPLANT
CNTNR SPEC 2.5X3XGRAD LEK (MISCELLANEOUS) ×1
CONT SPEC 4OZ STER OR WHT (MISCELLANEOUS) ×1
CONT SPEC 4OZ STRL OR WHT (MISCELLANEOUS) ×1
CONTAINER SPEC 2.5X3XGRAD LEK (MISCELLANEOUS) ×1 IMPLANT
COVER WAND RF STERILE (DRAPES) ×2 IMPLANT
DRAPE 3/4 80X56 (DRAPES) ×2 IMPLANT
DRAPE C-ARM XRAY 36X54 (DRAPES) ×2 IMPLANT
DRAPE SURG 17X11 SM STRL (DRAPES) ×8 IMPLANT
GAUZE SPONGE 4X4 12PLY STRL (GAUZE/BANDAGES/DRESSINGS) ×2 IMPLANT
GLIDEWIRE STIFF .35X180X3 HYDR (WIRE) IMPLANT
GLOVE SURG ENC MOIS LTX SZ6.5 (GLOVE) ×4 IMPLANT
GLOVE SURG UNDER POLY LF SZ6.5 (GLOVE) ×4 IMPLANT
GOWN STRL REUS W/ TWL LRG LVL3 (GOWN DISPOSABLE) ×2 IMPLANT
GOWN STRL REUS W/TWL LRG LVL3 (GOWN DISPOSABLE) ×2
GUIDEWIRE GREEN .038 145CM (MISCELLANEOUS) ×2 IMPLANT
GUIDEWIRE INTRO SET STRAIGHT (WIRE) ×3 IMPLANT
GUIDEWIRE STR DUAL SENSOR (WIRE) ×3 IMPLANT
GUIDEWIRE STR ZIPWIRE 035X150 (MISCELLANEOUS) ×1 IMPLANT
GUIDEWIRE SUPER STIFF (WIRE) ×2 IMPLANT
HOLDER FOLEY CATH W/STRAP (MISCELLANEOUS) ×2 IMPLANT
INTRODUCER DILATOR DOUBLE (INTRODUCER) IMPLANT
JELLY LUB 2OZ STRL (MISCELLANEOUS) ×1
JELLY LUBE 2OZ STRL (MISCELLANEOUS) ×1 IMPLANT
KIT PROBE TRILOGY 3.9X350 (MISCELLANEOUS) ×2 IMPLANT
MANIFOLD NEPTUNE II (INSTRUMENTS) ×4 IMPLANT
MAT ABSORB  FLUID 56X50 GRAY (MISCELLANEOUS) ×2
MAT ABSORB FLUID 56X50 GRAY (MISCELLANEOUS) ×2 IMPLANT
NDL FASCIA INCISION 18GA (NEEDLE) ×2 IMPLANT
PACK BASIN MINOR ARMC (MISCELLANEOUS) ×2 IMPLANT
PAD ABD DERMACEA PRESS 5X9 (GAUZE/BANDAGES/DRESSINGS) ×4 IMPLANT
PAD PREP 24X41 OB/GYN DISP (PERSONAL CARE ITEMS) ×2 IMPLANT
SCOPE LITHOVU DISP 9.5FR 7.7FR (UROLOGICAL SUPPLIES) IMPLANT
SCOPE LITHOVUE DISPOSABLE (UROLOGICAL SUPPLIES) ×1
SET IRRIGATING DISP (SET/KITS/TRAYS/PACK) ×2 IMPLANT
SHEET NEURO XL SOL CTL (MISCELLANEOUS) ×2 IMPLANT
SOL .9 NS 3000ML IRR  AL (IV SOLUTION) ×10
SOL .9 NS 3000ML IRR UROMATIC (IV SOLUTION) ×4 IMPLANT
SPONGE DRAIN TRACH 4X4 STRL 2S (GAUZE/BANDAGES/DRESSINGS) ×2 IMPLANT
STENT PERCUFLEX 4.8FRX24 (STENTS) ×1 IMPLANT
STENT URET 6FRX24 CONTOUR (STENTS) IMPLANT
STENT URET 6FRX26 CONTOUR (STENTS) IMPLANT
STRAP SAFETY 5IN WIDE (MISCELLANEOUS) ×4 IMPLANT
SUT SILK 0 SH 30 (SUTURE) ×2 IMPLANT
SYR 10ML LL (SYRINGE) ×2 IMPLANT
SYR 20ML LL LF (SYRINGE) ×2 IMPLANT
SYR 30ML LL (SYRINGE) ×2 IMPLANT
SYR TOOMEY IRRIG 70ML (MISCELLANEOUS) ×2
SYRINGE TOOMEY IRRIG 70ML (MISCELLANEOUS) ×1 IMPLANT
TAPE CLOTH 3X10 WHT NS LF (GAUZE/BANDAGES/DRESSINGS) ×2 IMPLANT
TAPE MICROFOAM 4IN (TAPE) ×2 IMPLANT
TRACTIP FLEXIVA PULSE ID 200 (Laser) ×1 IMPLANT
TRAY FOLEY MTR SLVR 16FR STAT (SET/KITS/TRAYS/PACK) ×2 IMPLANT
TUBING CONNECTING 10 (TUBING) ×2 IMPLANT
WATER STERILE IRR 1000ML POUR (IV SOLUTION) ×2 IMPLANT

## 2020-08-19 NOTE — Interval H&P Note (Signed)
History and Physical Interval Note:  08/19/2020 10:19 AM  Victoria Peterson  has presented today for surgery, with the diagnosis of left renal stone.  The various methods of treatment have been discussed with the patient and family. After consideration of risks, benefits and other options for treatment, the patient has consented to  Procedure(s): NEPHROLITHOTOMY PERCUTANEOUS (Left) as a surgical intervention.  The patient's history has been reviewed, patient examined, no change in status, stable for surgery.  I have reviewed the patient's chart and labs.  Questions were answered to the patient's satisfaction.    RRR CTAB  "allergy" to keflex but based on history, unlikely true allergy  Hollice Espy

## 2020-08-19 NOTE — Transfer of Care (Signed)
Immediate Anesthesia Transfer of Care Note  Patient: Victoria Peterson  Procedure(s) Performed: NEPHROLITHOTOMY PERCUTANEOUS (Left )  Patient Location: PACU  Anesthesia Type:General  Level of Consciousness: awake, alert  and oriented  Airway & Oxygen Therapy: Patient Spontanous Breathing and Patient connected to face mask oxygen  Post-op Assessment: Report given to RN and Post -op Vital signs reviewed and stable  Post vital signs: Reviewed and stable  Last Vitals:  Vitals Value Taken Time  BP 105/58 08/19/20 1454  Temp    Pulse 89 08/19/20 1454  Resp 24 08/19/20 1454  SpO2 100 % 08/19/20 1454  Vitals shown include unvalidated device data.  Last Pain:  Vitals:   08/19/20 1454  TempSrc:   PainSc: 0-No pain         Complications: No complications documented.

## 2020-08-19 NOTE — Anesthesia Procedure Notes (Signed)
Procedure Name: Intubation Date/Time: 08/19/2020 10:35 AM Performed by: Tollie Eth, CRNA Pre-anesthesia Checklist: Patient identified, Patient being monitored, Timeout performed, Emergency Drugs available and Suction available Patient Re-evaluated:Patient Re-evaluated prior to induction Oxygen Delivery Method: Circle system utilized Preoxygenation: Pre-oxygenation with 100% oxygen Induction Type: IV induction Ventilation: Mask ventilation without difficulty Laryngoscope Size: 3 and McGraph Grade View: Grade I Tube type: Oral Tube size: 7.0 mm Number of attempts: 1 Airway Equipment and Method: Stylet Placement Confirmation: ETT inserted through vocal cords under direct vision,  positive ETCO2 and breath sounds checked- equal and bilateral Secured at: 20 cm Tube secured with: Tape Dental Injury: Teeth and Oropharynx as per pre-operative assessment

## 2020-08-19 NOTE — Brief Op Note (Signed)
08/19/2020  2:57 PM  PATIENT:  [name]  80 y.o. female  PRE-OPERATIVE DIAGNOSIS:  left renal stone  POST-OPERATIVE DIAGNOSIS:  left renal stone  PROCEDURE:  Procedure(s): NEPHROLITHOTOMY PERCUTANEOUS (Left)  Left antegrade nephrostogram Interpretation of fluoroscopy less than 30 minutes Left nephrostomy tube replacement Left antegrade double-J ureteral stent placement Laser lithotripsy  SURGEON:  Surgeon(s) and Role:    Hollice Espy, MD - Primary  ASSISTANTS: none   ANESTHESIA:   general  EBL:  Total I/O In: 1700 [I.V.:1400; IV Piggyback:300] Out: 200 [Blood:200]  Drains: 4.8 x 24 French double-J ureteral stent on left, 20 [place] [employer] tip catheter as nephrostomy, 16 French Foley catheter  Specimen: stone fragment  COUNTS CORRECT: YES  PLAN OF CARE: Admit for overnight observation  PATIENT DISPOSITION:  PACU - hemodynamically stable.
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
## 2020-08-20 ENCOUNTER — Encounter: Payer: Self-pay | Admitting: Urology

## 2020-08-20 DIAGNOSIS — N200 Calculus of kidney: Principal | ICD-10-CM

## 2020-08-20 LAB — CBC
HCT: 32.9 % — ABNORMAL LOW (ref 36.0–46.0)
Hemoglobin: 11 g/dL — ABNORMAL LOW (ref 12.0–15.0)
MCH: 27.4 pg (ref 26.0–34.0)
MCHC: 33.4 g/dL (ref 30.0–36.0)
MCV: 82 fL (ref 80.0–100.0)
Platelets: 302 10*3/uL (ref 150–400)
RBC: 4.01 MIL/uL (ref 3.87–5.11)
RDW: 15.6 % — ABNORMAL HIGH (ref 11.5–15.5)
WBC: 9.1 10*3/uL (ref 4.0–10.5)
nRBC: 0 % (ref 0.0–0.2)

## 2020-08-20 LAB — BASIC METABOLIC PANEL
Anion gap: 9 (ref 5–15)
BUN: 10 mg/dL (ref 8–23)
CO2: 24 mmol/L (ref 22–32)
Calcium: 9.2 mg/dL (ref 8.9–10.3)
Chloride: 107 mmol/L (ref 98–111)
Creatinine, Ser: 0.73 mg/dL (ref 0.44–1.00)
GFR, Estimated: >60 mL/min (ref >60)
Glucose, Bld: 150 mg/dL — ABNORMAL HIGH (ref 70–99)
Potassium: 4 mmol/L (ref 3.5–5.1)
Sodium: 140 mmol/L (ref 135–145)

## 2020-08-20 MED ORDER — CIPROFLOXACIN HCL 500 MG PO TABS
ORAL_TABLET | ORAL | Status: AC
  Administered 2020-08-20: 500 mg via ORAL
  Filled 2020-08-20: qty 1

## 2020-08-20 MED ORDER — OXYBUTYNIN CHLORIDE 5 MG PO TABS: 5.0000 mg | ORAL_TABLET | Freq: Three times a day (TID) | ORAL | 1 refills | Status: DC | PRN

## 2020-08-20 MED ORDER — TAMSULOSIN HCL 0.4 MG PO CAPS: 0.4000 mg | ORAL_CAPSULE | Freq: Every day | ORAL | 0 refills | Status: DC

## 2020-08-20 MED ORDER — DOCUSATE SODIUM 100 MG PO CAPS: 100.0000 mg | ORAL_CAPSULE | Freq: Two times a day (BID) | ORAL | 0 refills | Status: DC

## 2020-08-20 MED ORDER — OXYCODONE-ACETAMINOPHEN 5-325 MG PO TABS: 1.0000 | ORAL_TABLET | Freq: Four times a day (QID) | ORAL | 0 refills | Status: DC | PRN

## 2020-08-20 MED ORDER — HEPARIN SODIUM (PORCINE) 5000 UNIT/ML IJ SOLN
INTRAMUSCULAR | Status: AC
  Administered 2020-08-20: 5000 [IU] via SUBCUTANEOUS
  Filled 2020-08-20: qty 1

## 2020-08-20 NOTE — Discharge Summary (Signed)
Date of admission: 08/19/2020  Date of discharge: 08/20/2020  Admission diagnosis: Left obstructing staghorn calculus, left nonobstructing kidney stones  Discharge diagnosis: Same as above  Secondary diagnoses:  Patient Active Problem List   Diagnosis Date Noted  . Left nephrolithiasis 08/19/2020  . Ureterolithiasis 07/12/2020  . Renal calculus, left 07/12/2020  . RLS (restless legs syndrome) 07/12/2020  . COVID-19 virus infection 07/12/2020  . Cervical myelopathy ([place]) 02/19/2020  . Intractable nausea and vomiting   . 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 Acute midline low back pain without sciatica 04/12/2019  . Prediabetes 02/01/2019  . Advanced care planning/counseling discussion 10/25/2017  . 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 History of ulcerative colitis 10/15/2015  . Arthritis of both knees 10/15/2015    History and Physical: For full details, please see admission history and physical. Briefly, Victoria Peterson is a 80 y.o. year old patient admitted on 08/19/2020 for scheduled left PCNL with Dr. Erlene Quan for management of left obstructing staghorn calculus and nonobstructing renal stones.   Hospital Course: Patient tolerated the procedure well.  She was then transferred to the floor after an uneventful PACU stay.  Her hospital course was uncomplicated.  On POD#1 she had met discharge criteria: was eating a regular diet, was up and ambulating independently,  pain was well controlled, was voiding without a catheter, tolerated a clamping trial with subsequent removal of her left nephrostomy tube, and was ready for discharge.  Laboratory values:  Recent Labs    08/20/20 0633  WBC 9.1  HGB 11.0*  HCT 32.9*   Recent Labs    08/20/20 0633  NA 140  K 4.0  CL 107  CO2 24  GLUCOSE 150*  BUN 10  CREATININE 0.73  CALCIUM 9.2    Results  for orders placed or performed in visit on 08/06/20  Microscopic Examination     Status: Abnormal   Collection Time: 08/06/20  4:01 PM   Urine  Result Value Ref Range Status   WBC, UA 11-30 (A) 0 - 5 /hpf Final   RBC >30 (H) 0 - 2 /hpf Final   Epithelial Cells (non renal) None seen 0 - 10 /hpf Final   Bacteria, UA Many (A) None seen/Few Final  CULTURE, URINE COMPREHENSIVE     Status: Abnormal   Collection Time: 08/06/20  4:30 PM   Specimen: Urine   UR  Result Value Ref Range Status   Urine Culture, Comprehensive Final report (A)  Final   Organism ID, Bacteria Enterococcus faecalis (A)  Final    Comment: For Enterococcus species, aminoglycosides (except for high-level resistance screening), cephalosporins, clindamycin, and trimethoprim-sulfamethoxazole are not effective clinically. (CLSI, M100-S26, 2016) Greater than 100,000 colony forming units per mL    Organism ID, Bacteria Yeast isolated. (A)  Final    Comment: 9,000 Colonies/mL Request for further identification must be made within 1 week.    ANTIMICROBIAL SUSCEPTIBILITY Comment  Final    Comment:       ** S = Susceptible; I = Intermediate; R = Resistant **                    P = Positive; N = Negative             MICS are expressed in micrograms per mL    Antibiotic  RSLT#1    RSLT#2    RSLT#3    RSLT#4 Ciprofloxacin                  S Levofloxacin                   S Nitrofurantoin                 S Penicillin                     S Tetracycline                   R Vancomycin                     S     Disposition: Home  Discharge instruction: The patient was instructed to be ambulatory but told to refrain from heavy lifting, strenuous activity, or driving.  We discussed symptoms to anticipate while her left ureteral stent remains in place and the need for staged ureteroscopy with laser lithotripsy and stent exchange in approximately 4 weeks.  Discharge medications:  Allergies as of 08/20/2020       Reactions   Cefazolin Other (See Comments)   Hypotension\ 08/19/2020- pt received 2g ancef. Not reaction noted      Medication List    TAKE these medications   acetaminophen 500 MG tablet Commonly known as: TYLENOL Take 500-1,000 mg by mouth every 6 (six) hours as needed for moderate pain.   CALCIUM + D + K PO Take 1 tablet by mouth daily.   ciprofloxacin 500 MG tablet Commonly known as: CIPRO Take 1 tablet (500 mg total) by mouth every 12 (twelve) hours. Beginning 08/16/2020.   diclofenac Sodium 1 % Gel Commonly known as: VOLTAREN Apply 2 g topically 4 (four) times daily as needed (knee pain).   docusate sodium 100 MG capsule Commonly known as: COLACE Take 1 capsule (100 mg total) by mouth 2 (two) times daily.   fluconazole 200 MG tablet Commonly known as: DIFLUCAN Take 1 tablet (200 mg total) by mouth daily. for 5 days beginning 08/16/2020.   multivitamin tablet Take 1 tablet by mouth daily.   oxybutynin 5 MG tablet Commonly known as: DITROPAN Take 1 tablet (5 mg total) by mouth every 8 (eight) hours as needed for bladder spasms.   oxyCODONE-acetaminophen 5-325 MG tablet Commonly known as: PERCOCET/ROXICET Take 1-2 tablets by mouth every 6 (six) hours as needed for moderate pain.   rOPINIRole 0.5 MG tablet Commonly known as: REQUIP Take 0.5 mg by mouth at bedtime.   tamsulosin 0.4 MG Caps capsule Commonly known as: FLOMAX Take 1 capsule (0.4 mg total) by mouth daily.      Followup:   Follow-up Information    Hollice Espy, MD. Go in 4 weeks.   Specialty: Urology Why: staged ureteroscopy with laser lithotripsy and stent exchange Contact information: [address] [place] [id number] [phone]

## 2020-08-20 NOTE — Progress Notes (Signed)
Urology Inpatient Progress Note  Subjective: No acute events overnight.  She is afebrile, VSS. Hemoglobin slightly down today, 11.0.  Creatinine stable, 0.73.  On empiric antibiotics for her preop urine culture as below. Today she reports feeling well.  She has had some urinary urgency this morning despite Foley catheter in place.  She denies flank or abdominal pain.  She is tolerating p.o. intake. Foley catheter in place draining clear, pink urine.  Left nephrostomy in place draining clear, red urine.  Anti-infectives: Anti-infectives (From admission, onward)   Start     Dose/Rate Route Frequency Ordered Stop   08/20/20 1000  fluconazole (DIFLUCAN) tablet 200 mg       Note to Pharmacy: for 5 days beginning 08/16/2020.     200 mg Oral Daily 08/19/20 1647 08/22/20 0959   08/19/20 2251  ciprofloxacin (CIPRO) 500 MG tablet       Note to Pharmacy: Vilma Meckel   : cabinet override      08/19/20 2251 08/20/20 1059   08/19/20 2200  ciprofloxacin (CIPRO) tablet 500 mg        500 mg Oral Every 12 hours 08/19/20 1641     08/19/20 1645  fluconazole (DIFLUCAN) tablet 200 mg  Status:  Discontinued       Note to Pharmacy: for 5 days beginning 08/16/2020.     200 mg Oral Daily 08/19/20 1642 08/19/20 1647   08/19/20 1016  ceFAZolin (ANCEF) 2-4 GM/100ML-% IVPB       Note to Pharmacy: Leonia Reader   : cabinet override      08/19/20 1016 08/19/20 2229      Current Facility-Administered Medications  Medication Dose Route Frequency Provider Last Rate Last Admin  . 0.9 %  sodium chloride infusion   Intravenous Continuous Hollice Espy, MD 100 mL/hr at 08/19/20 1800 Infusion Verify at 08/19/20 1800  . acetaminophen (TYLENOL) tablet 650 mg  650 mg Oral Q4H PRN Hollice Espy, MD      . belladonna-opium (B&O) suppository 16.2-60mg  1 suppository Rectal Q6H PRN Hollice Espy, MD      . ciprofloxacin (CIPRO) 500 MG tablet           . ciprofloxacin (CIPRO) tablet 500 mg  500 mg Oral Q12H Hollice Espy, MD      . diphenhydrAMINE (BENADRYL) injection 12.5 mg  12.5 mg Intravenous Q6H PRN Hollice Espy, MD       Or  . diphenhydrAMINE (BENADRYL) 12.5 MG/5ML elixir 12.5 mg  12.5 mg Oral Q6H PRN Hollice Espy, MD      . docusate sodium (COLACE) capsule 100 mg  100 mg Oral BID Hollice Espy, MD   100 mg at 08/19/20 2351  . fluconazole (DIFLUCAN) tablet 200 mg  200 mg Oral Daily Hollice Espy, MD      . heparin injection 5,000 Units  5,000 Units Subcutaneous Q8H Hollice Espy, MD   5,000 Units at 08/20/20 [phone]  . morphine 4 MG/ML injection 2-4 mg  2-4 mg Intravenous Q2H PRN Hollice Espy, MD      . ondansetron [facility]) injection 4 mg  4 mg Intravenous Q4H PRN Hollice Espy, MD   4 mg at 08/19/20 1700  . oxybutynin (DITROPAN) tablet 5 mg  5 mg Oral Q8H PRN Hollice Espy, MD      . oxyCODONE-acetaminophen (PERCOCET/ROXICET) 5-325 MG per tablet 1-2 tablet  1-2 tablet Oral Q4H PRN Hollice Espy, MD      . rOPINIRole (REQUIP) tablet 0.5 mg  0.5 mg Oral QHS  Hollice Espy, MD       Objective: Vital signs in last 24 hours: Temp:  [96.8 F (36 C)-97 F (36.1 C)] 96.8 F (36 C) (01/17 1721) Pulse Rate:  [83-90] 89 (01/17 1721) Resp:  [8-25] 16 (01/18 0000) BP: (102-133)/(48-72) 133/66 (01/18 0000) SpO2:  [96 %-100 %] 97 % (01/17 1721)  Intake/Output from previous day: 01/17 0701 - 01/18 0700 In: 1900.1 [P.O.:200; I.V.:1400.1; IV Piggyback:300] Out: 2035 [Urine:5895; Stool:1; Blood:200] Intake/Output this shift: No intake/output data recorded.  Physical Exam Vitals and nursing note reviewed.  Constitutional:      General: She is not in acute distress.    Appearance: She is not ill-appearing, toxic-appearing or diaphoretic.  HENT:     Head: Normocephalic and atraumatic.  Pulmonary:     Effort: Pulmonary effort is normal. No respiratory distress.  Skin:    General: Skin is warm and dry.  Neurological:     Mental Status: She is alert and oriented to person, place,  and time.  Psychiatric:        Mood and Affect: Mood normal.        Behavior: Behavior normal.    Lab Results:  Recent Labs    08/20/20 0633  WBC 9.1  HGB 11.0*  HCT 32.9*  PLT 302   BMET Recent Labs    08/20/20 0633  NA 140  K 4.0  CL 107  CO2 24  GLUCOSE 150*  BUN 10  CREATININE 0.73  CALCIUM 9.2   Assessment & Plan: 80 year old female POD 1 from left PCNL for management of a left partial staghorn calculus.  Operative findings notable for difficulty with antegrade ureteral stent placement consistent with possible distal migration of stone fragment versus ureteral stricture.  Ultimately, stent was successfully placed.  She does have some residual upper pole stone burden following PCNL.  She is recovering well today with no acute concerns.  We discussed that she will require outpatient staged ureteroscopy in approximately 4 weeks to clear her residual upper pole stone burden.  I explained that she will have a left ureteral stent in place until this point and that common stent symptoms include gross hematuria, dysuria, urgency, frequency, and flank pain.  Patient expressed understanding.  I removed her Foley catheter at the bedside today, draining 10 cc of water from the 16 French Foley catheter balloon and removing the catheter in its entirety without difficulty.  Patient tolerated well.  Subsequently, I detached the night bag from the patient's left nephrostomy tube and plugged the 20 [place] council tip catheter to initiate a clamp trial.  Left nephrostomy tube to remain clamped this morning.  If she does not develop any acute left flank pain, will return to the bedside midday to remove her left nephrostomy tube and plan for discharge later today.  Debroah Loop, PA-C 08/20/2020

## 2020-08-20 NOTE — Op Note (Addendum)
Date of procedure: 08/19/2020  Preoperative diagnosis:  1. Left obstructing staghorn calculus, 3.5 cm 2. Left nonobstructing kidney stones  Postoperative diagnosis:  1. Same as above  Procedure: 1. Left percutaneous nephrolithotomy, 3.5 cm 2. Left antegrade nephrostogram 3. Left antegrade ureteral stent placement 4. Basket extraction of stone fragment 5. Laser lithotripsy of nonobstructing stones 6. Replacement of left nephrostomy tube 7. Interpretation of fluoroscopy less than 30 minutes  Surgeon: Hollice Espy, MD  Anesthesia: General  Complications: None  Intraoperative findings: Primary staghorn calculus, 3.5 cm involving the lower pole as well as renal pelvis clear today there were some small stones which appear to be in a parallel calyx which I was not able to access behind the sheath.  Laser lithotripsy also attempted of nonobstructing upper pole stone, able to partially but not completely treat.  Some difficulty with antegrade stent placement necessitating placement of a 4.8 x 24 French stent, possible ureteral stone fragment versus stricture.  EBL: 200 cc  Drains: 61 [place] council tip Foley as left nephrostomy, 4.8 x 26 French double-J ureteral stent, 16 French urethral Foley catheter  Specimen: Stone fragment  Indication: Victoria Peterson is a 80 y.o. patient with large obstructing partial staghorn as well as some additional nonobstructing stone burden who initially presented with sepsis of urinary source and underwent emergent lower pole PCN placement.  She returns today for definitive management of her stones.  After reviewing the management options for treatment, she elected to proceed with the above surgical procedure(s). We have discussed the potential benefits and risks of the procedure, side effects of the proposed treatment, the likelihood of the patient achieving the goals of the procedure, and any potential problems that might occur during the procedure or  recuperation. Informed consent has been obtained.  Description of procedure:  The patient was taken to the operating room and general anesthesia was induced on the stretcher and a Foley catheter was placed.  She was then moved over to the operating room table in a prone position with extreme care to pad all pressure points and secured to the table.  She was then prepped and draped in standard sterile fashion.  She did receive IV Ancef perioperatively which was well-tolerated as based on her history, it was deemed that this is not likely a true allergy. A preoperative time-out was performed.   At this point in time, Scout imaging revealed a large 2.5 cm staghorn involving the lower pole extending into the renal pelvis.  The nephrostomy tube was seen at this location entering a lower pole calyx.  Contrast was injected through the nephrostomy tube to outline the collecting system.  Contrast was seen filling the proximal ureter.  I then cannulated the nephrostomy tube using a sensor wire.  Nephrostomy tube loop was unlocked and the nephrostomy was removed leaving only the wire in place.  I then used a 5 [place] Kumpe catheter over the sensor wire to help directed inferiorly into the renal pelvis and down the ureter.  It was able to advance this all the way down to the level of the bladder without difficulty.  I then used an 8 [place] access sheath over the wire to the level of the UPJ to advance a second Super Stiff wire.  This would not advance beyond the level of the iliacs successfully for unclear reasons, either possibly his stone or stricture at this location.  I left the wire coiled at this location as it seemed sufficient as a working wire over  which to place the renal dilator.  A fascial incising needle was used to make a cruciate incision in the fascia over one of the wires.  The skin incision was opened to approximately 1 cm to accommodate the NephroMax balloon.  This was advanced over the Super Stiff  working wire to the level of the stone just within the collecting system.  The balloon was then inflated to 18 cm of water and allowed to remain in place for several minutes to help tamponade/help with hemostasis.  I then advanced the sheath over the balloon to satisfactory position.  The balloon was then deflated and removed.  Nephroscope was advanced where the stone was immediately encountered.  Trilogy lithotripter was used to fragment and aspirate all of the stone in this lower pole calyx and all the stone extending into the renal pelvis and a few adjacent calyces.  Some stone could be seen which appear to be parallel with this sheath and I was not able to access this.  Was also not able to access the 11 mm stone using the nephroscope.  At this point, I exchanged the scope for a flexible cystoscope in which I was able to angle up towards an upper pole calyx.  243 m laser fiber was then brought in using dusting settings of 0.2 J and 40 Hz, I began to dust the stone.  I was only able to address about a third of the stone and then unable to continue as it was not making progress and there was significant angulation.  I also tried using a flexible ureteroscope to see if this had more new maneuverability but in fact it had less and thus it was not successful and further fragmenting this upper pole nonobstructing stone completely.  This point time, I made the decision to place an antegrade ureteral stent and return for staged procedure at a later date to address her residual nonobstructing stone burden.  Unfortunately, I was never able to get the wire through the sheath all the way down to the bladder.  I tried some maneuvers including exchanging the wire for a Glidewire, using an open-ended ureteral sheath for stabilization and even advancing the ureteroscope down the ureter but was not able to get the second wire down to the bladder.  The safety wire remained in the bladder the entirety of the procedure.  I did  inject some contrast at the level of the mid ureter or small amount of extravasation was appreciated as well as some filling defects either consistent with clot or stone material.  There appeared to be some narrowing of the distal third of the ureter around the area of multiple surgical clips.  There are some suspicion of a chronic stricture at this location versus stone fragment.  Likely had the safety wire in place in the bladder.  In order to utilize this wire which was excluded from the sheath, the sheath had to be removed.  I ended up placing an antegrade 4.8 x 24 French double-J ureteral stent over this wire into the bladder.  Multiple angles of the bladder were taken to ensure that the distal coil in fact was in the bladder which it clearly appeared to be as it curled crossing the midline as the wire was slowly removed.  A curl was also noted within the renal pelvis.  I then used the other wire to place a nephrostomy tube through the tract into the collecting system.  I used a Garment/textile technologist [place] Foley  filling the balloon with 1.5 cc of diluted contrast solution.  I then removed this wire as well.  Imaging confirmed excellent position of both ureteral stent as well as the nephrostomy tube.  I then performed 1 final antegrade nephrostogram through the nephrostomy tube again confirming good antegrade ureteral stent placement without contrast extravasation from the collecting system.  The nephrostomy tube was secured in place.  A dressing of 4 x 4's, and ABD pad and foam tape was applied.  The patient was then repositioned in the supine position on the stretcher and extubated.  She was taken the PACU in stable condition.  Plan: She will be admitted overnight for observation.  We will plan to remove her Foley catheter and Her nephrostomy tube in the morning.  We will plan to return for staged procedure ureteroscopically in about 4 weeks to treat residual stone as well as assess the ureter.   Hollice Espy, M.D.

## 2020-08-20 NOTE — Progress Notes (Signed)
Nsg Discharge Note  Admit Date:  08/19/2020 Discharge date: 08/20/2020   [name] to be D/C'd Home per MD order.  AVS completed.   Patient/caregiver able to verbalize understanding.  Discharge Medication: Allergies as of 08/20/2020      Reactions   Cefazolin Other (See Comments)   Hypotension\ 08/19/2020- pt received 2g ancef. Not reaction noted      Medication List    TAKE these medications   acetaminophen 500 MG tablet Commonly known as: TYLENOL Take 500-1,000 mg by mouth every 6 (six) hours as needed for moderate pain.   CALCIUM + D + K PO Take 1 tablet by mouth daily.   ciprofloxacin 500 MG tablet Commonly known as: CIPRO Take 1 tablet (500 mg total) by mouth every 12 (twelve) hours. Beginning 08/16/2020.   diclofenac Sodium 1 % Gel Commonly known as: VOLTAREN Apply 2 g topically 4 (four) times daily as needed (knee pain).   docusate sodium 100 MG capsule Commonly known as: COLACE Take 1 capsule (100 mg total) by mouth 2 (two) times daily.   fluconazole 200 MG tablet Commonly known as: DIFLUCAN Take 1 tablet (200 mg total) by mouth daily. for 5 days beginning 08/16/2020.   multivitamin tablet Take 1 tablet by mouth daily.   oxybutynin 5 MG tablet Commonly known as: DITROPAN Take 1 tablet (5 mg total) by mouth every 8 (eight) hours as needed for bladder spasms.   oxyCODONE-acetaminophen 5-325 MG tablet Commonly known as: PERCOCET/ROXICET Take 1-2 tablets by mouth every 6 (six) hours as needed for moderate pain.   rOPINIRole 0.5 MG tablet Commonly known as: REQUIP Take 0.5 mg by mouth at bedtime.   tamsulosin 0.4 MG Caps capsule Commonly known as: FLOMAX Take 1 capsule (0.4 mg total) by mouth daily.       Discharge Assessment: Vitals:   08/20/20 0000 08/20/20 1216  BP: 133/66 131/76  Pulse:  (!) 101  Resp: 16 16  Temp:  98.8 F (37.1 C)  SpO2:  97%   Skin clean, dry and intact without evidence of skin break down, no evidence of skin tears  noted. IV catheter discontinued intact. Site without signs and symptoms of complications - no redness or edema noted at insertion site, patient denies c/o pain - only slight tenderness at site.  Dressing with slight pressure applied.  D/c Instructions-Education: Discharge instructions given to patient/family with verbalized understanding. D/c education completed with patient/family including follow up instructions, medication list, d/c activities limitations if indicated, with other d/c instructions as indicated by MD - patient able to verbalize understanding, all questions fully answered. Patient instructed to return to ED, call 911, or call MD for any changes in condition.  Patient escorted via [place], and D/C home via private auto.  Tresa Endo, RN 08/20/2020 1:33 PM

## 2020-08-20 NOTE — Anesthesia Postprocedure Evaluation (Signed)
Anesthesia Post Note  Patient: Victoria Peterson  Procedure(s) Performed: NEPHROLITHOTOMY PERCUTANEOUS (Left )  Patient location during evaluation: PACU Anesthesia Type: General Level of consciousness: awake and alert Pain management: pain level controlled Vital Signs Assessment: post-procedure vital signs reviewed and stable Respiratory status: spontaneous breathing, nonlabored ventilation, respiratory function stable and patient connected to nasal cannula oxygen Cardiovascular status: blood pressure returned to baseline and stable Postop Assessment: no apparent nausea or vomiting Anesthetic complications: no   No complications documented.   Last Vitals:  Vitals:   08/19/20 1721 08/20/20 0000  BP: (!) 102/48 133/66  Pulse: 89   Resp: 16 16  Temp: (!) 36 C   SpO2: 97%     Last Pain:  Vitals:   08/20/20 0000  TempSrc:   PainSc: 0-No pain                 Precious Haws Verlin Duke

## 2020-08-20 NOTE — Progress Notes (Signed)
I returned to the bedside this afternoon.  Patient denies left flank pain during her clamping trial this morning.  She has voided twice with mild dysuria consistent with stent discomfort.  She has ambulated and is tolerating p.o. intake.  Using sterile forceps and scissors, I removed the securing stitches from the left nephrostomy tube, drained 1 cc of contrast material from the balloon, and removed the nephrostomy tube without difficulty.  Patient tolerated well, no complications noted.  Wound was dressed with absorbent gauze and ABD pads and secured in place with foam tape.  Will plan for discharge this afternoon

## 2020-08-23 ENCOUNTER — Other Ambulatory Visit: Payer: Self-pay | Admitting: Radiology

## 2020-08-23 DIAGNOSIS — N200 Calculus of kidney: Secondary | ICD-10-CM

## 2020-08-23 LAB — CALCULI, WITH PHOTOGRAPH (CLINICAL LAB)
Calcium Oxalate Dihydrate: 90 %
Calcium Oxalate Monohydrate: 10 %
Weight Calculi: 1400 mg

## 2020-08-27 DIAGNOSIS — M4322 Fusion of spine, cervical region: Secondary | ICD-10-CM | POA: Diagnosis not present

## 2020-08-27 DIAGNOSIS — Z981 Arthrodesis status: Secondary | ICD-10-CM | POA: Diagnosis not present

## 2020-09-04 ENCOUNTER — Other Ambulatory Visit: Payer: Self-pay

## 2020-09-04 DIAGNOSIS — N200 Calculus of kidney: Secondary | ICD-10-CM

## 2020-09-09 ENCOUNTER — Other Ambulatory Visit: Payer: Self-pay

## 2020-09-09 ENCOUNTER — Other Ambulatory Visit: Payer: Medicare Other

## 2020-09-09 DIAGNOSIS — N200 Calculus of kidney: Secondary | ICD-10-CM

## 2020-09-10 ENCOUNTER — Telehealth: Payer: Self-pay

## 2020-09-10 DIAGNOSIS — N390 Urinary tract infection, site not specified: Secondary | ICD-10-CM

## 2020-09-10 DIAGNOSIS — R319 Hematuria, unspecified: Secondary | ICD-10-CM

## 2020-09-10 LAB — MICROSCOPIC EXAMINATION
RBC, Urine: >30 /hpf — AB (ref 0–2)
WBC, UA: >30 /hpf — AB (ref 0–5)

## 2020-09-10 LAB — URINALYSIS, COMPLETE
Bilirubin, UA: NEGATIVE
Glucose, UA: NEGATIVE
Ketones, UA: NEGATIVE
Nitrite, UA: POSITIVE — AB
Specific Gravity, UA: 1.02 (ref 1.005–1.030)
Urobilinogen, Ur: 0.2 mg/dL (ref 0.2–1.0)
pH, UA: 5.5 (ref 5.0–7.5)

## 2020-09-10 MED ORDER — TAMSULOSIN HCL 0.4 MG PO CAPS: 0.4000 mg | ORAL_CAPSULE | Freq: Every day | ORAL | 0 refills | Status: DC

## 2020-09-10 MED ORDER — CIPROFLOXACIN HCL 500 MG PO TABS: 500.0000 mg | ORAL_TABLET | Freq: Two times a day (BID) | ORAL | 0 refills | Status: DC

## 2020-09-10 MED ORDER — OXYBUTYNIN CHLORIDE 5 MG PO TABS: 5.0000 mg | ORAL_TABLET | Freq: Three times a day (TID) | ORAL | 1 refills | Status: DC | PRN

## 2020-09-10 NOTE — Telephone Encounter (Signed)
Patient called stating that when she dropped off her urine yesterday she mentioned that she was having increased urinary symptoms and wanted to check in on her results. Patient states that she is having frequency and dysuria. Denies fever, chills, nausea or vomiting. It was explained that these symptoms are most likely due to her stent. Her urine was sent for culture and the results are not yet back it can take 3-5 days. Per Dr. Erlene Quan will send in a script for Cipro 572m BID for 7 days for treatment prior to surgery next week. Patient also states she is out of oxybutinin and flomax these medications were refilled to help her with her stent discomfort.  Attempted to reach patient to notify her of medications being sent, left a message for her to return the call.

## 2020-09-10 NOTE — Telephone Encounter (Signed)
Patient notified she states she has picked up the medications from the pharmacy and will start today. She was advised that she could utilize OTC AZO to help with dysuria if needed and to increase water intake. Patient states she has not received any information about her upcoming surgery and wanted to know if she could speak with Amy in regards to this. Attempted to reach line was busy, she was notified a message would be sent to her

## 2020-09-11 ENCOUNTER — Other Ambulatory Visit: Payer: Self-pay | Admitting: Physician Assistant

## 2020-09-11 NOTE — Telephone Encounter (Signed)
Repeated discussion with patient regarding instructions for surgery. Questions answered. Patient verbalized understanding.

## 2020-09-12 ENCOUNTER — Other Ambulatory Visit: Payer: Medicare Other

## 2020-09-12 ENCOUNTER — Telehealth: Payer: Self-pay | Admitting: *Deleted

## 2020-09-12 MED ORDER — SULFAMETHOXAZOLE-TRIMETHOPRIM 800-160 MG PO TABS: 1.0000 | ORAL_TABLET | Freq: Two times a day (BID) | ORAL | 0 refills | Status: DC

## 2020-09-12 MED ORDER — SODIUM CHLORIDE 0.9 % IV SOLN: INTRAVENOUS | Status: DC

## 2020-09-12 MED ORDER — SODIUM CHLORIDE 0.9% FLUSH: 5.0000 mL | Freq: Three times a day (TID) | INTRAVENOUS | Status: DC

## 2020-09-12 NOTE — Telephone Encounter (Addendum)
Patient informed, sent in RX to [facility] as requested-voiced understanding.   ----- Message from Hollice Espy, MD sent at 09/12/2020  1:06 PM EST ----- Urine culture was resistant to Cipro.  Have her stop this medication start Bactrim DS twice daily for total of 7 days.  Hollice Espy, MD

## 2020-09-13 LAB — CULTURE, URINE COMPREHENSIVE

## 2020-09-16 ENCOUNTER — Ambulatory Visit: Payer: Medicare Other | Admitting: Certified Registered"

## 2020-09-16 ENCOUNTER — Ambulatory Visit
Admission: RE | Admit: 2020-09-16 | Discharge: 2020-09-16 | Disposition: A | Discharge status: Home / Self Care | Payer: Medicare Other | Attending: Urology | Admitting: Urology

## 2020-09-16 ENCOUNTER — Other Ambulatory Visit: Payer: Self-pay

## 2020-09-16 ENCOUNTER — Ambulatory Visit: Payer: Medicare Other

## 2020-09-16 ENCOUNTER — Encounter
Admission: RE | Disposition: A | Discharge status: Home / Self Care | Payer: Self-pay | Source: Home / Self Care | Attending: Urology

## 2020-09-16 DIAGNOSIS — Z79899 Other long term (current) drug therapy: Secondary | ICD-10-CM | POA: Insufficient documentation

## 2020-09-16 DIAGNOSIS — Z8616 Personal history of COVID-19: Secondary | ICD-10-CM | POA: Insufficient documentation

## 2020-09-16 DIAGNOSIS — N132 Hydronephrosis with renal and ureteral calculous obstruction: Secondary | ICD-10-CM | POA: Insufficient documentation

## 2020-09-16 DIAGNOSIS — Z820 Family history of epilepsy and other diseases of the nervous system: Secondary | ICD-10-CM | POA: Diagnosis not present

## 2020-09-16 DIAGNOSIS — R7303 Prediabetes: Secondary | ICD-10-CM | POA: Diagnosis not present

## 2020-09-16 DIAGNOSIS — G2581 Restless legs syndrome: Secondary | ICD-10-CM | POA: Diagnosis not present

## 2020-09-16 DIAGNOSIS — E785 Hyperlipidemia, unspecified: Secondary | ICD-10-CM | POA: Diagnosis not present

## 2020-09-16 DIAGNOSIS — Z881 Allergy status to other antibiotic agents status: Secondary | ICD-10-CM | POA: Diagnosis not present

## 2020-09-16 DIAGNOSIS — D649 Anemia, unspecified: Secondary | ICD-10-CM | POA: Diagnosis not present

## 2020-09-16 DIAGNOSIS — Z823 Family history of stroke: Secondary | ICD-10-CM | POA: Diagnosis not present

## 2020-09-16 DIAGNOSIS — Z791 Long term (current) use of non-steroidal anti-inflammatories (NSAID): Secondary | ICD-10-CM | POA: Diagnosis not present

## 2020-09-16 DIAGNOSIS — Z803 Family history of malignant neoplasm of breast: Secondary | ICD-10-CM | POA: Insufficient documentation

## 2020-09-16 DIAGNOSIS — N200 Calculus of kidney: Secondary | ICD-10-CM | POA: Diagnosis not present

## 2020-09-16 DIAGNOSIS — Z809 Family history of malignant neoplasm, unspecified: Secondary | ICD-10-CM | POA: Insufficient documentation

## 2020-09-16 DIAGNOSIS — G5603 Carpal tunnel syndrome, bilateral upper limbs: Secondary | ICD-10-CM | POA: Diagnosis not present

## 2020-09-16 DIAGNOSIS — N202 Calculus of kidney with calculus of ureter: Secondary | ICD-10-CM | POA: Diagnosis not present

## 2020-09-16 DIAGNOSIS — N1330 Unspecified hydronephrosis: Secondary | ICD-10-CM | POA: Diagnosis not present

## 2020-09-16 HISTORY — PX: CYSTOSCOPY/URETEROSCOPY/HOLMIUM LASER/STENT PLACEMENT: SHX6546

## 2020-09-16 SURGERY — CYSTOSCOPY/URETEROSCOPY/HOLMIUM LASER/STENT PLACEMENT
Anesthesia: General | Laterality: Left

## 2020-09-16 MED ORDER — FAMOTIDINE 20 MG PO TABS: 20.0000 mg | ORAL_TABLET | Freq: Once | ORAL | Status: AC

## 2020-09-16 MED ORDER — FENTANYL CITRATE (PF) 100 MCG/2ML IJ SOLN
INTRAMUSCULAR | Status: AC
  Filled 2020-09-16: qty 2

## 2020-09-16 MED ORDER — MEPERIDINE HCL 50 MG/ML IJ SOLN: 6.2500 mg | INTRAMUSCULAR | Status: DC | PRN

## 2020-09-16 MED ORDER — LIDOCAINE HCL (CARDIAC) PF 100 MG/5ML IV SOSY
PREFILLED_SYRINGE | INTRAVENOUS | Status: DC | PRN
  Administered 2020-09-16: 100 mg via INTRAVENOUS

## 2020-09-16 MED ORDER — ROCURONIUM BROMIDE 10 MG/ML (PF) SYRINGE
PREFILLED_SYRINGE | INTRAVENOUS | Status: AC
  Filled 2020-09-16: qty 10

## 2020-09-16 MED ORDER — WHITE PETROLATUM EX OINT
TOPICAL_OINTMENT | CUTANEOUS | Status: AC
  Filled 2020-09-16: qty 5

## 2020-09-16 MED ORDER — PHENYLEPHRINE HCL (PRESSORS) 10 MG/ML IV SOLN
INTRAVENOUS | Status: AC
  Filled 2020-09-16: qty 1

## 2020-09-16 MED ORDER — CHLORHEXIDINE GLUCONATE 0.12 % MT SOLN: 15.0000 mL | Freq: Once | OROMUCOSAL | Status: AC

## 2020-09-16 MED ORDER — PROPOFOL 10 MG/ML IV BOLUS
INTRAVENOUS | Status: DC | PRN
  Administered 2020-09-16: 120 mg via INTRAVENOUS

## 2020-09-16 MED ORDER — ONDANSETRON HCL 4 MG/2ML IJ SOLN
INTRAMUSCULAR | Status: DC | PRN
  Administered 2020-09-16: 4 mg via INTRAVENOUS

## 2020-09-16 MED ORDER — PROMETHAZINE HCL 25 MG/ML IJ SOLN: 6.2500 mg | INTRAMUSCULAR | Status: DC | PRN

## 2020-09-16 MED ORDER — ESMOLOL HCL 100 MG/10ML IV SOLN
INTRAVENOUS | Status: DC | PRN
  Administered 2020-09-16: 10 mg via INTRAVENOUS

## 2020-09-16 MED ORDER — ESMOLOL HCL 100 MG/10ML IV SOLN
INTRAVENOUS | Status: AC
  Filled 2020-09-16: qty 10

## 2020-09-16 MED ORDER — ONDANSETRON HCL 4 MG/2ML IJ SOLN
INTRAMUSCULAR | Status: AC
  Filled 2020-09-16: qty 4

## 2020-09-16 MED ORDER — DIPHENHYDRAMINE HCL 50 MG/ML IJ SOLN: 12.5000 mg | Freq: Once | INTRAMUSCULAR | Status: AC

## 2020-09-16 MED ORDER — MIDAZOLAM HCL 2 MG/2ML IJ SOLN
INTRAMUSCULAR | Status: AC
  Filled 2020-09-16: qty 2

## 2020-09-16 MED ORDER — DEXAMETHASONE SODIUM PHOSPHATE 10 MG/ML IJ SOLN
INTRAMUSCULAR | Status: AC
  Filled 2020-09-16: qty 1

## 2020-09-16 MED ORDER — SUCCINYLCHOLINE CHLORIDE 200 MG/10ML IV SOSY
PREFILLED_SYRINGE | INTRAVENOUS | Status: AC
  Filled 2020-09-16: qty 10

## 2020-09-16 MED ORDER — LIDOCAINE HCL (PF) 2 % IJ SOLN
INTRAMUSCULAR | Status: AC
  Filled 2020-09-16: qty 25

## 2020-09-16 MED ORDER — ACETAMINOPHEN 10 MG/ML IV SOLN
INTRAVENOUS | Status: DC | PRN
  Administered 2020-09-16: 1000 mg via INTRAVENOUS

## 2020-09-16 MED ORDER — ROCURONIUM BROMIDE 100 MG/10ML IV SOLN
INTRAVENOUS | Status: DC | PRN
  Administered 2020-09-16: 10 mg via INTRAVENOUS
  Administered 2020-09-16: 40 mg via INTRAVENOUS
  Administered 2020-09-16: 10 mg via INTRAVENOUS

## 2020-09-16 MED ORDER — OXYCODONE HCL 5 MG/5ML PO SOLN: 5.0000 mg | Freq: Once | ORAL | Status: DC | PRN

## 2020-09-16 MED ORDER — ACETAMINOPHEN 10 MG/ML IV SOLN
INTRAVENOUS | Status: AC
  Filled 2020-09-16: qty 100

## 2020-09-16 MED ORDER — EPHEDRINE SULFATE 50 MG/ML IJ SOLN
INTRAMUSCULAR | Status: DC | PRN
  Administered 2020-09-16 (×2): 5 mg via INTRAVENOUS

## 2020-09-16 MED ORDER — SUGAMMADEX SODIUM 500 MG/5ML IV SOLN
INTRAVENOUS | Status: DC | PRN
  Administered 2020-09-16: 400 mg via INTRAVENOUS

## 2020-09-16 MED ORDER — LACTATED RINGERS IV SOLN: INTRAVENOUS | Status: DC

## 2020-09-16 MED ORDER — DROPERIDOL 2.5 MG/ML IJ SOLN
0.6250 mg | Freq: Once | INTRAMUSCULAR | Status: DC | PRN
  Filled 2020-09-16: qty 2

## 2020-09-16 MED ORDER — CEFAZOLIN SODIUM-DEXTROSE 2-4 GM/100ML-% IV SOLN
INTRAVENOUS | Status: AC
  Filled 2020-09-16: qty 100

## 2020-09-16 MED ORDER — OXYCODONE HCL 5 MG PO TABS: 5.0000 mg | ORAL_TABLET | Freq: Once | ORAL | Status: DC | PRN

## 2020-09-16 MED ORDER — SUCCINYLCHOLINE CHLORIDE 20 MG/ML IJ SOLN
INTRAMUSCULAR | Status: DC | PRN
  Administered 2020-09-16: 120 mg via INTRAVENOUS

## 2020-09-16 MED ORDER — OXYBUTYNIN CHLORIDE 5 MG PO TABS: 5.0000 mg | ORAL_TABLET | Freq: Three times a day (TID) | ORAL | 1 refills | Status: DC | PRN

## 2020-09-16 MED ORDER — ORAL CARE MOUTH RINSE: 15.0000 mL | Freq: Once | OROMUCOSAL | Status: AC

## 2020-09-16 MED ORDER — EPHEDRINE 5 MG/ML INJ
INTRAVENOUS | Status: AC
  Filled 2020-09-16: qty 10

## 2020-09-16 MED ORDER — PHENYLEPHRINE HCL (PRESSORS) 10 MG/ML IV SOLN
INTRAVENOUS | Status: DC | PRN
  Administered 2020-09-16: 200 ug via INTRAVENOUS
  Administered 2020-09-16 (×2): 100 ug via INTRAVENOUS

## 2020-09-16 MED ORDER — FENTANYL CITRATE (PF) 100 MCG/2ML IJ SOLN
INTRAMUSCULAR | Status: DC | PRN
  Administered 2020-09-16 (×2): 50 ug via INTRAVENOUS

## 2020-09-16 MED ORDER — DIPHENHYDRAMINE HCL 50 MG/ML IJ SOLN
INTRAMUSCULAR | Status: AC
  Administered 2020-09-16: 12.5 mg via INTRAVENOUS
  Filled 2020-09-16: qty 1

## 2020-09-16 MED ORDER — CHLORHEXIDINE GLUCONATE 0.12 % MT SOLN
OROMUCOSAL | Status: AC
  Administered 2020-09-16: 15 mL via OROMUCOSAL
  Filled 2020-09-16: qty 15

## 2020-09-16 MED ORDER — PROPOFOL 500 MG/50ML IV EMUL
INTRAVENOUS | Status: AC
  Filled 2020-09-16: qty 150

## 2020-09-16 MED ORDER — DEXAMETHASONE SODIUM PHOSPHATE 10 MG/ML IJ SOLN
INTRAMUSCULAR | Status: DC | PRN
  Administered 2020-09-16: 10 mg via INTRAVENOUS

## 2020-09-16 MED ORDER — GLYCOPYRROLATE 0.2 MG/ML IJ SOLN
INTRAMUSCULAR | Status: AC
  Filled 2020-09-16: qty 1

## 2020-09-16 MED ORDER — OXYCODONE-ACETAMINOPHEN 5-325 MG PO TABS: 1.0000 | ORAL_TABLET | Freq: Four times a day (QID) | ORAL | 0 refills | Status: DC | PRN

## 2020-09-16 MED ORDER — LORAZEPAM 2 MG/ML IJ SOLN: 1.0000 mg | Freq: Once | INTRAMUSCULAR | Status: DC | PRN

## 2020-09-16 MED ORDER — GLYCOPYRROLATE 0.2 MG/ML IJ SOLN
INTRAMUSCULAR | Status: DC | PRN
  Administered 2020-09-16: .2 mg via INTRAVENOUS

## 2020-09-16 MED ORDER — FAMOTIDINE 20 MG PO TABS
ORAL_TABLET | ORAL | Status: AC
  Administered 2020-09-16: 20 mg via ORAL
  Filled 2020-09-16: qty 1

## 2020-09-16 MED ORDER — MIDAZOLAM HCL 2 MG/2ML IJ SOLN
INTRAMUSCULAR | Status: DC | PRN
  Administered 2020-09-16 (×2): 1 mg via INTRAVENOUS

## 2020-09-16 MED ORDER — TAMSULOSIN HCL 0.4 MG PO CAPS: 0.4000 mg | ORAL_CAPSULE | Freq: Every day | ORAL | 0 refills | Status: DC

## 2020-09-16 MED ORDER — HYDROMORPHONE HCL 1 MG/ML IJ SOLN: 0.2500 mg | INTRAMUSCULAR | Status: DC | PRN

## 2020-09-16 MED ORDER — SUGAMMADEX SODIUM 500 MG/5ML IV SOLN
INTRAVENOUS | Status: AC
  Filled 2020-09-16: qty 5

## 2020-09-16 MED ORDER — CEFAZOLIN SODIUM-DEXTROSE 2-4 GM/100ML-% IV SOLN
2.0000 g | Freq: Once | INTRAVENOUS | Status: AC
  Administered 2020-09-16: 2 g via INTRAVENOUS

## 2020-09-16 SURGICAL SUPPLY — 31 items
BAG DRAIN CYSTO-URO LG1000N (MISCELLANEOUS) ×2 IMPLANT
BASKET ZERO TIP 1.9FR (BASKET) ×2 IMPLANT
BRUSH SCRUB EZ 1% IODOPHOR (MISCELLANEOUS) ×2 IMPLANT
BSKT STON RTRVL ZERO TP 1.9FR (BASKET) ×1
CATH URET FLEX-TIP 2 LUMEN 10F (CATHETERS) ×2 IMPLANT
CATH URETL 5X70 OPEN END (CATHETERS) ×2 IMPLANT
CNTNR SPEC 2.5X3XGRAD LEK (MISCELLANEOUS)
CONT SPEC 4OZ STER OR WHT (MISCELLANEOUS)
CONT SPEC 4OZ STRL OR WHT (MISCELLANEOUS)
CONTAINER SPEC 2.5X3XGRAD LEK (MISCELLANEOUS) IMPLANT
DRAPE UTILITY 15X26 TOWEL STRL (DRAPES) ×2 IMPLANT
GLOVE SURG ENC MOIS LTX SZ6.5 (GLOVE) ×2 IMPLANT
GOWN STRL REUS W/ TWL LRG LVL3 (GOWN DISPOSABLE) ×2 IMPLANT
GOWN STRL REUS W/TWL LRG LVL3 (GOWN DISPOSABLE) ×4
GUIDEWIRE GREEN .038 145CM (MISCELLANEOUS) ×2 IMPLANT
GUIDEWIRE STR DUAL SENSOR (WIRE) ×2 IMPLANT
INFUSOR MANOMETER BAG 3000ML (MISCELLANEOUS) ×2 IMPLANT
INTRODUCER DILATOR DOUBLE (INTRODUCER) IMPLANT
KIT TURNOVER CYSTO (KITS) ×2 IMPLANT
MANIFOLD NEPTUNE II (INSTRUMENTS) ×2 IMPLANT
PACK CYSTO AR (MISCELLANEOUS) ×2 IMPLANT
SET CYSTO W/LG BORE CLAMP LF (SET/KITS/TRAYS/PACK) ×2 IMPLANT
SHEATH URETERAL 12FRX35CM (MISCELLANEOUS) ×2 IMPLANT
SOL .9 NS 3000ML IRR  AL (IV SOLUTION) ×2
SOL .9 NS 3000ML IRR AL (IV SOLUTION) ×1
SOL .9 NS 3000ML IRR UROMATIC (IV SOLUTION) ×1 IMPLANT
STENT URET 6FRX24 CONTOUR (STENTS) ×2 IMPLANT
STENT URET 6FRX26 CONTOUR (STENTS) IMPLANT
SURGILUBE 2OZ TUBE FLIPTOP (MISCELLANEOUS) ×2 IMPLANT
TRACTIP FLEXIVA PULSE ID 200 (Laser) ×2 IMPLANT
WATER STERILE IRR 1000ML POUR (IV SOLUTION) ×2 IMPLANT

## 2020-09-16 NOTE — Anesthesia Postprocedure Evaluation (Signed)
Anesthesia Post Note  Patient: Victoria Peterson  Procedure(s) Performed: CYSTOSCOPY/URETEROSCOPY/HOLMIUM LASER/STENT Exchange (Left )  Patient location during evaluation: PACU Anesthesia Type: General Level of consciousness: awake Pain management: pain level controlled Vital Signs Assessment: post-procedure vital signs reviewed and stable Respiratory status: spontaneous breathing Cardiovascular status: blood pressure returned to baseline Postop Assessment: no apparent nausea or vomiting Anesthetic complications: no   No complications documented.   Last Vitals:  Vitals:   09/16/20 1017 09/16/20 1030  BP: 126/74 (!) 143/77  Pulse:  98  Resp: 19 18  Temp: (!) 36.1 C (!) 35.9 C  SpO2:  97%    Last Pain:  Vitals:   09/16/20 1030  TempSrc: Temporal  PainSc: 0-No pain                 Neva Seat
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
## 2020-09-16 NOTE — Transfer of Care (Signed)
Immediate Anesthesia Transfer of Care Note  Patient: Victoria Peterson  Procedure(s) Performed: CYSTOSCOPY/URETEROSCOPY/HOLMIUM LASER/STENT Exchange (Left )  Patient Location: PACU  Anesthesia Type:General  Level of Consciousness: awake, drowsy and patient cooperative  Airway & Oxygen Therapy: Patient Spontanous Breathing and Patient connected to face mask oxygen  Post-op Assessment: Report given to RN and Post -op Vital signs reviewed and stable  Post vital signs: Reviewed and stable  Last Vitals:  Vitals Value Taken Time  BP 135/64 09/16/20 0945  Temp    Pulse 103 09/16/20 0946  Resp 15 09/16/20 0946  SpO2 100 % 09/16/20 0946  Vitals shown include unvalidated device data.  Last Pain:  Vitals:   09/16/20 0706  TempSrc:   PainSc: 6          Complications: No complications documented.

## 2020-09-16 NOTE — Anesthesia Procedure Notes (Signed)
Procedure Name: Intubation Performed by: Fletcher-Harrison, Kalinda Romaniello, CRNA Pre-anesthesia Checklist: Patient identified, Emergency Drugs available, Suction available and Patient being monitored Patient Re-evaluated:Patient Re-evaluated prior to induction Oxygen Delivery Method: Circle system utilized Preoxygenation: Pre-oxygenation with 100% oxygen Induction Type: IV induction Ventilation: Mask ventilation without difficulty Laryngoscope Size: McGraph and 3 Grade View: Grade I Tube type: Oral Tube size: 6.5 mm Number of attempts: 1 Airway Equipment and Method: Stylet and Oral airway Placement Confirmation: ETT inserted through vocal cords under direct vision,  positive ETCO2,  breath sounds checked- equal and bilateral and CO2 detector Secured at: 21 cm Tube secured with: Tape Dental Injury: Teeth and Oropharynx as per pre-operative assessment        

## 2020-09-16 NOTE — Discharge Instructions (Signed)
You have a ureteral stent in place.  This is a tube that extends from your kidney to your bladder.  This may cause urinary bleeding, burning with urination, and urinary frequency.  Please call our office or present to the ED if you develop fevers >101 or pain which is not able to be controlled with oral pain medications.  You may be given either Flomax and/ or ditropan to help with bladder spasms and stent pain in addition to pain medications.    Your stent is on a string.  On Thursday morning, you may untape it from your left inner thigh and pulled gently until the entire stent is removed.  If you have any issues whatsoever or concerns, please call our office and we can assist you.  If you do not feel comfortable removing it yourself, we can have you come into the office to help you.  [place] [address] [place], [place] [address] [phone]

## 2020-09-16 NOTE — Op Note (Signed)
Date of procedure: 09/16/20  Preoperative diagnosis:  1. Left staghorn calculus  Postoperative diagnosis:  1. Same as above, partial 2. Left proximal ureteral calculi times multiple  Procedure: 1. Left ureteroscopy with laser lithotripsy 2. Left ureteral stent exchange 3. Left retrograde pyelogram 4. Basket extraction of stone fragment 5. Interpretation of fluoroscopy less than 30 minutes  Surgeon: Hollice Espy, MD  Anesthesia: General  Complications: None  Intraoperative findings: Numerous left proximal ureteral calculi obliterated as well as some nonobstructing stone burden in the left stone including a large at least 1.2 cm stone in the left mid upper pole calyx with infundibular stenosis.  All stone cleared without contrast extravasation.  Stent replaced on tether.  EBL: Minimal  Specimens: Stone fragment  Drains: 6 x 24 double-J ureteral stent on left  Indication: Victoria Peterson is a 79 y.o. patient with large left staghorn calculus status post left PCNL who returns today for staged procedure to treat residual stone burden and suspected ureteral fragments.  After reviewing the management options for treatment, she elected to proceed with the above surgical procedure(s). We have discussed the potential benefits and risks of the procedure, side effects of the proposed treatment, the likelihood of the patient achieving the goals of the procedure, and any potential problems that might occur during the procedure or recuperation. Informed consent has been obtained.  Description of procedure:  The patient was taken to the operating room and general anesthesia was induced.  The patient was placed in the dorsal lithotomy position, prepped and draped in the usual sterile fashion, and preoperative antibiotics were administered. A preoperative time-out was performed.   A 21 French scope was advanced per urethra into the bladder.  Attention was turned to the left ureter orifice from  which a ureteral stent was seen emanating.  The distal coil of the stent was grasped and brought to level urethral meatus.  There was then cannulated using a sensor wire up to the level of the kidney.  Notably, on scout imaging, there was what appeared to be several left proximal ureteral calculi appreciated and I did have some challenges navigating the wire around this area but ultimately was successful in doing so.  I then attempted to advance Super Stiff wire using a dual-lumen initially unsuccessfully.  I then advanced a semirigid up to the level of the mid ureter but is not able to advance it much above this level.  This was secondary to some edema as well as simply the anatomy.  With some manipulation, I was eventually able to advance a Super Stiff wire beyond the obstructing proximal ureteral calculi.  Under fluoroscopic guidance, I then advanced a ureteral access sheath, 12/14 ureteral access sheath to the mid ureter essentially to the level where it previously was able to advance the semirigid ureteroscope.  I then brought in a dual lumen digital flexible ureteral scope and was able to advance it to the proximal ureter through this sheath where I encountered some stone fragments.  A 274 micro laser fiber was then brought in and using dusting settings of 0.2 J and 40 Hz, this proximal ureteral stone was addressed along with numerous other proximal stones just behind this.  The proximal ureter was essentially small Steinstrasse.  Once I was able to treat all of the stones, I was able to advance the access sheath further into the proximal ureter facilitating subsequent fragmentation.  Additional stones which were nonobstructing was treated within the upper tract collecting system.  Ultimately, larger  stone, at least 1.2 cm or larger was encountered within the mid pole calyx.  Initially, the stenotic infundibulum is not appreciated but ultimately I was able to laser open this further in order to address the  stone.  This was also completely fragmented.  Ultimately, I did transition my power to 1.0 and 20 Hz for this larger harder stone.  He is 1.9 [place] tipless nitinol basket to remove the fragments.  Ultimately, the entire collecting system was able to be cleared.  I did 1 last retrograde pyelogram through the scope indicating no contrast extravasation craning roadmap to ensure that each every calyx was clear.  No residual stone fragment remained greater than the tip of the laser fiber in size.  I then slowly backed down the scope along the length of the ureter inspecting it while removing the sheath.  There are no ureteral fragments or injuries appreciated.  There is some mild ureteral edema but otherwise the ureter appeared to be good with excellent integrity.  Finally, 6 x 24 French double-J ureteral stent was advanced over the wire up to the level of the kidney.  The wires partially drawn till full coils noted both within the renal pelvis as well as within the bladder.  The bladder was then drained.  The string was left attached to the distal coil the stent which was fixed to the patient's left inner thigh using Mastisol and Tegaderm.  She was then cleaned and dried, repositioned in supine position, reversed from anesthesia, taken to the PACU in stable condition.  Plan: We will have her remove her own stent on Thursday.  We will have her follow-up in 4 weeks with renal ultrasound prior.  She should complete her course of periprocedural antibiotics.  Hollice Espy, M.D.
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
## 2020-09-17 ENCOUNTER — Encounter: Payer: Self-pay | Admitting: Urology

## 2020-09-18 ENCOUNTER — Ambulatory Visit: Payer: Medicare Other | Admitting: Urology

## 2020-09-24 ENCOUNTER — Other Ambulatory Visit: Payer: Self-pay | Admitting: Radiology

## 2020-09-24 DIAGNOSIS — N200 Calculus of kidney: Secondary | ICD-10-CM

## 2020-10-04 ENCOUNTER — Other Ambulatory Visit: Payer: Self-pay | Admitting: Urology

## 2020-10-10 IMAGING — RF DG C-ARM 1-60 MIN
1 series · 2 of 2 positions shown · non-contrast
Comparison: 01/12/2020

CLINICAL DATA: Cervical fusion

EXAM:
CERVICAL SPINE - 2-3 VIEW; DG C-ARM 1-60 MIN

[Series 1: dg x-ray · 0.20mm/px · 2 of 2 slices shown]
[im 1/2]
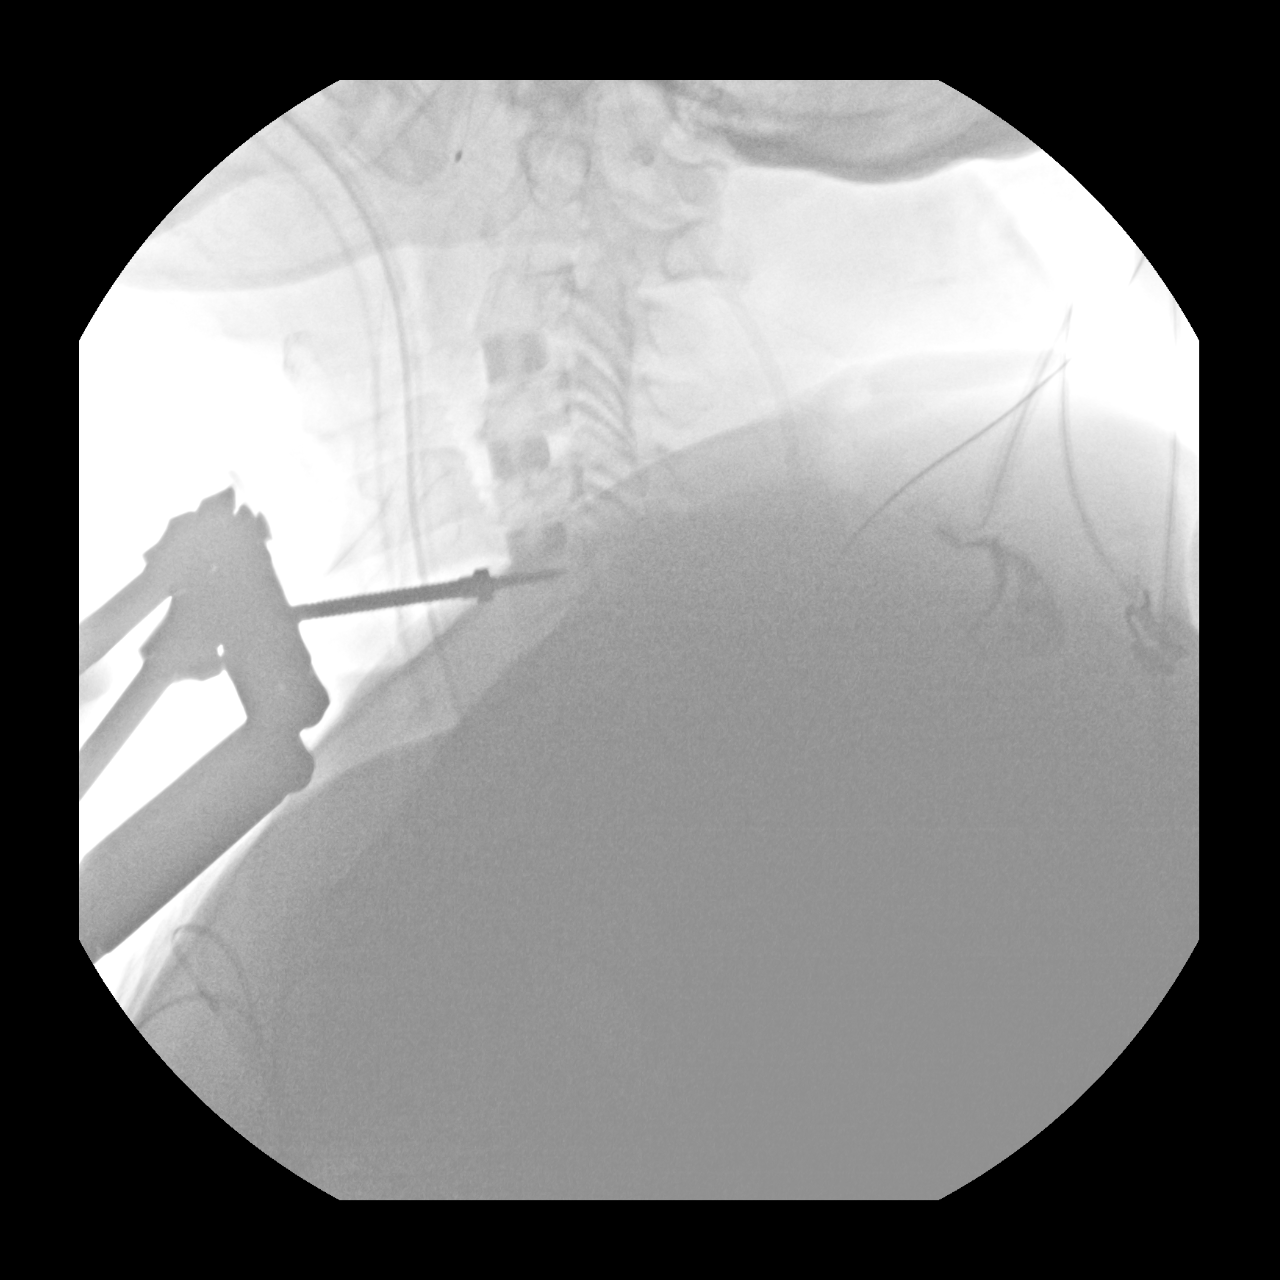
[im 2/2]
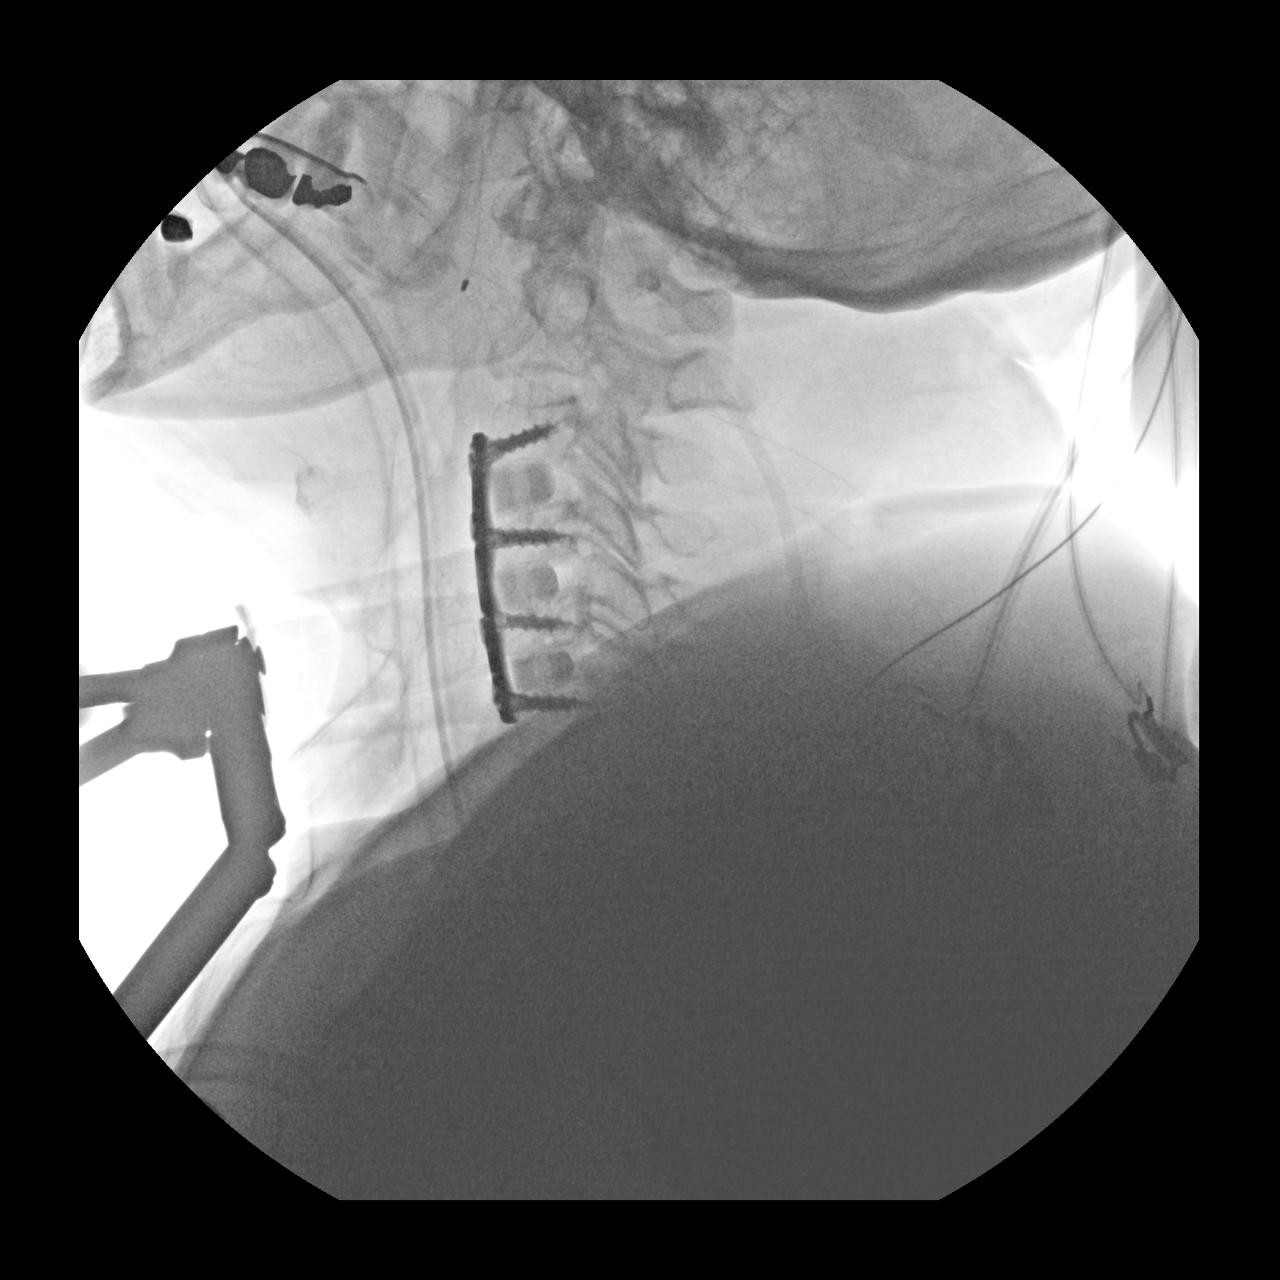

[2 of 2 positions shown; findings below may reference images not displayed]

FLUOROSCOPY TIME:  Fluoroscopy Time:  6 seconds

Radiation Exposure Index (if provided by the fluoroscopic device):
0.53 mGy

Number of Acquired Spot Images: 2
FINDINGS: Initial images demonstrate interbody fusion at C3-4, C4-5 and C5-6.
Second film demonstrates interbody fusion at these levels with an
anterior fixation plate and fixation screws extending from C3-C6.
IMPRESSION: C3-C6 fusion.

## 2020-10-11 ENCOUNTER — Other Ambulatory Visit: Payer: Self-pay

## 2020-10-11 ENCOUNTER — Ambulatory Visit
Admission: RE | Admit: 2020-10-11 | Discharge: 2020-10-11 | Disposition: A | Discharge status: Home / Self Care | Payer: Medicare Other | Source: Ambulatory Visit | Attending: Urology | Admitting: Urology

## 2020-10-11 DIAGNOSIS — N200 Calculus of kidney: Secondary | ICD-10-CM | POA: Insufficient documentation

## 2020-10-11 DIAGNOSIS — N281 Cyst of kidney, acquired: Secondary | ICD-10-CM | POA: Diagnosis not present

## 2020-10-16 ENCOUNTER — Other Ambulatory Visit: Payer: Self-pay

## 2020-10-16 ENCOUNTER — Encounter: Payer: Self-pay | Admitting: Urology

## 2020-10-16 ENCOUNTER — Ambulatory Visit: Payer: Medicare Other | Admitting: Urology

## 2020-10-16 ENCOUNTER — Telehealth: Payer: Self-pay

## 2020-10-16 VITALS — BP 153/81 | HR 79 | Ht 60.0 in | Wt 160.0 lb

## 2020-10-16 DIAGNOSIS — N1330 Unspecified hydronephrosis: Secondary | ICD-10-CM

## 2020-10-16 DIAGNOSIS — N200 Calculus of kidney: Secondary | ICD-10-CM
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
## 2020-10-16 NOTE — Telephone Encounter (Signed)
Called to schedule pt an appt no answer left vm   Copied from [place] [id number]. Topic: Appointment Scheduling - Scheduling Inquiry for Clinic >> Oct 16, 2020 10:24 AM Oneta Rack wrote: Reason for CRM: patient was advised by urologist to follow up with PCP due to blood in stool. Patient would like to be worked in with PCP only next week. PCP has no available appointments until April. Please reach out patient is aware PCP is out of the office this week.

## 2020-10-21 ENCOUNTER — Ambulatory Visit (INDEPENDENT_AMBULATORY_CARE_PROVIDER_SITE_OTHER): Payer: Medicare Other | Admitting: Family Medicine

## 2020-10-21 ENCOUNTER — Encounter: Payer: Self-pay | Admitting: Family Medicine

## 2020-10-21 ENCOUNTER — Other Ambulatory Visit: Payer: Self-pay

## 2020-10-21 VITALS — BP 110/68 | HR 88 | Temp 98.3°F | Wt 161.2 lb

## 2020-10-21 DIAGNOSIS — K625 Hemorrhage of anus and rectum: Secondary | ICD-10-CM

## 2020-10-21 DIAGNOSIS — Z8719 Personal history of other diseases of the digestive system: Secondary | ICD-10-CM

## 2020-10-21 DIAGNOSIS — R1084 Generalized abdominal pain: Secondary | ICD-10-CM

## 2020-10-21 DIAGNOSIS — L299 Pruritus, unspecified: Secondary | ICD-10-CM | POA: Diagnosis not present

## 2020-10-21 NOTE — Addendum Note (Signed)
Addended by: Valerie Roys on: 10/21/2020 04:37 PM   Modules accepted: Orders

## 2020-10-21 NOTE — Progress Notes (Signed)
BP 110/68   Pulse 88   Temp 98.3 F (36.8 C)   Wt 161 lb 3.2 oz (73.1 kg)   SpO2 99%   BMI 31.48 kg/m    Subjective:    Patient ID: Victoria Peterson, female    DOB: 08-Apr-1941, 80 y.o.   MRN: [id number]  HPI: Victoria Peterson is a 80 y.o. female  Chief Complaint  Patient presents with  . Rectal Bleeding    Patient states about 10 days ago she noticed bright red in her stool. Patient stopped taking all prescription medications when she noticed blood in stool. Patient states her stomach is tender.    RECTAL BLEEDING Duration: 10 days Bright red rectal bleeding: yes  Amount of blood: moderate  Frequency: just with bowel movements Melena: yes  Spotting on toilet tissue: yes  Anal fullness: no  Perianal pain: yes  Severity: moderate Perianal irritation/itching: no  Constipation: no  Chronic straining/valsava:  no  Anal trauma/intercourse: no  Hemorrhoids: no  Previous colonoscopy: yes  Relevant past medical, surgical, family and social history reviewed and updated as indicated. Interim medical history since our last visit reviewed. Allergies and medications reviewed and updated.  Review of Systems  Constitutional: Negative.   Respiratory: Negative.   Cardiovascular: Negative.   Gastrointestinal: Positive for abdominal pain, anal bleeding and blood in stool. Negative for abdominal distention, constipation, diarrhea, nausea, rectal pain and vomiting.  Genitourinary: Negative.   Musculoskeletal: Negative.   Skin: Negative.   Psychiatric/Behavioral: Negative.     Per HPI unless specifically indicated above     Objective:    BP 110/68   Pulse 88   Temp 98.3 F (36.8 C)   Wt 161 lb 3.2 oz (73.1 kg)   SpO2 99%   BMI 31.48 kg/m   Wt Readings from Last 3 Encounters:  10/21/20 161 lb 3.2 oz (73.1 kg)  10/16/20 160 lb (72.6 kg)  08/19/20 160 lb (72.6 kg)    Physical Exam Vitals and nursing note reviewed.  Constitutional:      General: She is not in acute  distress.    Appearance: Normal appearance. She is not ill-appearing, toxic-appearing or diaphoretic.  HENT:     Head: Normocephalic and atraumatic.     Right Ear: External ear normal.     Left Ear: External ear normal.     Nose: Nose normal.     Mouth/Throat:     Mouth: Mucous membranes are moist.     Pharynx: Oropharynx is clear.  Eyes:     General: No scleral icterus.       Right eye: No discharge.        Left eye: No discharge.     Extraocular Movements: Extraocular movements intact.     Conjunctiva/sclera: Conjunctivae normal.     Pupils: Pupils are equal, round, and reactive to light.  Cardiovascular:     Rate and Rhythm: Normal rate and regular rhythm.     Pulses: Normal pulses.     Heart sounds: Normal heart sounds. No murmur heard. No friction rub. No gallop.   Pulmonary:     Effort: Pulmonary effort is normal. No respiratory distress.     Breath sounds: Normal breath sounds. No stridor. No wheezing, rhonchi or rales.  Chest:     Chest wall: No tenderness.  Abdominal:     General: Abdomen is flat. Bowel sounds are normal. There is no distension.     Palpations: Abdomen is soft. There is no mass.  Tenderness: There is abdominal tenderness (diffuse). There is no right CVA tenderness, left CVA tenderness, guarding or rebound.     Hernia: No hernia is present.  Musculoskeletal:        General: Normal range of motion.     Cervical back: Normal range of motion and neck supple.  Skin:    General: Skin is warm and dry.     Capillary Refill: Capillary refill takes less than 2 seconds.     Coloration: Skin is not jaundiced or pale.     Findings: No bruising, erythema, lesion or rash.  Neurological:     General: No focal deficit present.     Mental Status: She is alert and oriented to person, place, and time. Mental status is at baseline.  Psychiatric:        Mood and Affect: Mood normal.        Behavior: Behavior normal.        Thought Content: Thought content normal.         Judgment: Judgment normal.     Results for orders placed or performed in visit on 09/09/20  CULTURE, URINE COMPREHENSIVE   Specimen: Urine   UR  Result Value Ref Range   Urine Culture, Comprehensive Final report (A)    Organism ID, Bacteria Escherichia coli (A)    Organism ID, Bacteria Comment (A)    ANTIMICROBIAL SUSCEPTIBILITY Comment   Microscopic Examination   Urine  Result Value Ref Range   WBC, UA >30 (A) 0 - 5 /hpf   RBC >30 (A) 0 - 2 /hpf   Epithelial Cells (non renal) 0-10 0 - 10 /hpf   Renal Epithel, UA 0-10 (A) None seen /hpf   Bacteria, UA Moderate (A) None seen/Few  Urinalysis, Complete  Result Value Ref Range   Specific Gravity, UA 1.020 1.005 - 1.030   pH, UA 5.5 5.0 - 7.5   Color, UA Yellow Yellow   Appearance Ur Cloudy (A) Clear   Leukocytes,UA 1+ (A) Negative   Protein,UA 2+ (A) Negative/Trace   Glucose, UA Negative Negative   Ketones, UA Negative Negative   RBC, UA 3+ (A) Negative   Bilirubin, UA Negative Negative   Urobilinogen, Ur 0.2 0.2 - 1.0 mg/dL   Nitrite, UA Positive (A) Negative   Microscopic Examination See below:       Assessment & Plan:   Problem List Items Addressed This Visit      Other   History of ulcerative colitis   Relevant Orders   Ambulatory referral to Gastroenterology    Other Visit Diagnoses    Generalized abdominal pain    -  Primary   Will check CT given bleeding, tenderness and extensive abdominal surgery Hx. Concern for UC, diverticulitis vs mass. Referral to GI placed today.   Relevant Orders   CT Abdomen Pelvis W Contrast   Ambulatory referral to Gastroenterology   Rectal bleeding       Labs including stool studies done today. Will obtain CT given tenderness and extensive abdominal surgery history. Call with any concerns. Continue to monitor.    Relevant Orders   CBC With Differential/Platelet   Fecal occult blood, imunochemical([facility])   Stool C-Diff Toxin Assay   Stool Culture   Ova and  parasite examination   Fecal leukocytes   CT Abdomen Pelvis W Contrast   Ambulatory referral to Gastroenterology   Pruritus       Likely allergies- will check labs today. Await results.    Relevant Orders  Comprehensive metabolic panel       Follow up plan: Return in about 2 weeks (around 11/04/2020).

## 2020-10-22 ENCOUNTER — Other Ambulatory Visit: Payer: Medicare Other

## 2020-10-22 DIAGNOSIS — K625 Hemorrhage of anus and rectum: Secondary | ICD-10-CM | POA: Diagnosis not present

## 2020-10-22 LAB — CBC WITH DIFFERENTIAL/PLATELET
Basophils Absolute: 0 10*3/uL (ref 0.0–0.2)
Basos: 1 %
EOS (ABSOLUTE): 0.1 10*3/uL (ref 0.0–0.4)
Eos: 2 %
Hematocrit: 34.6 % (ref 34.0–46.6)
Hemoglobin: 11.4 g/dL (ref 11.1–15.9)
Immature Grans (Abs): 0 10*3/uL (ref 0.0–0.1)
Immature Granulocytes: 0 %
Lymphocytes Absolute: 1.6 10*3/uL (ref 0.7–3.1)
Lymphs: 35 %
MCH: 25.9 pg — ABNORMAL LOW (ref 26.6–33.0)
MCHC: 32.9 g/dL (ref 31.5–35.7)
MCV: 79 fL (ref 79–97)
Monocytes Absolute: 0.4 10*3/uL (ref 0.1–0.9)
Monocytes: 8 %
Neutrophils Absolute: 2.5 10*3/uL (ref 1.4–7.0)
Neutrophils: 54 %
Platelets: 297 10*3/uL (ref 150–450)
RBC: 4.41 x10E6/uL (ref 3.77–5.28)
RDW: 14 % (ref 11.7–15.4)
WBC: 4.6 10*3/uL (ref 3.4–10.8)

## 2020-10-22 LAB — COMPREHENSIVE METABOLIC PANEL
ALT: 14 IU/L (ref 0–32)
AST: 21 IU/L (ref 0–40)
Albumin/Globulin Ratio: 1.8 (ref 1.2–2.2)
Albumin: 4.4 g/dL (ref 3.7–4.7)
Alkaline Phosphatase: 66 IU/L (ref 44–121)
BUN/Creatinine Ratio: 18 (ref 12–28)
BUN: 15 mg/dL (ref 8–27)
Bilirubin Total: 0.5 mg/dL (ref 0.0–1.2)
CO2: 21 mmol/L (ref 20–29)
Calcium: 9.5 mg/dL (ref 8.7–10.3)
Chloride: 109 mmol/L — ABNORMAL HIGH (ref 96–106)
Creatinine, Ser: 0.83 mg/dL (ref 0.57–1.00)
Globulin, Total: 2.4 g/dL (ref 1.5–4.5)
Glucose: 112 mg/dL — ABNORMAL HIGH (ref 65–99)
Potassium: 3.8 mmol/L (ref 3.5–5.2)
Sodium: 144 mmol/L (ref 134–144)
Total Protein: 6.8 g/dL (ref 6.0–8.5)
eGFR: 72 mL/min/{1.73_m2} (ref >59)

## 2020-10-22 NOTE — Addendum Note (Signed)
Addended by: Rudie Meyer on: 10/22/2020 02:44 PM   Modules accepted: Orders

## 2020-10-23 LAB — FECAL OCCULT BLOOD, IMMUNOCHEMICAL: Fecal Occult Bld: POSITIVE — AB

## 2020-10-26 LAB — OVA AND PARASITE EXAMINATION

## 2020-10-26 LAB — FECAL LEUKOCYTES

## 2020-10-26 LAB — STOOL CULTURE: E coli, Shiga toxin Assay: NEGATIVE

## 2020-10-26 LAB — CLOSTRIDIUM DIFFICILE EIA: C difficile Toxins A+B, EIA: NEGATIVE

## 2020-11-05 ENCOUNTER — Other Ambulatory Visit: Payer: Self-pay

## 2020-11-05 ENCOUNTER — Encounter: Payer: Self-pay | Admitting: Family Medicine

## 2020-11-05 ENCOUNTER — Ambulatory Visit (INDEPENDENT_AMBULATORY_CARE_PROVIDER_SITE_OTHER): Payer: Medicare Other | Admitting: Family Medicine

## 2020-11-05 VITALS — BP 151/81 | HR 81 | Temp 97.9°F | Wt 162.6 lb

## 2020-11-05 DIAGNOSIS — R109 Unspecified abdominal pain: Secondary | ICD-10-CM | POA: Diagnosis not present

## 2020-11-05 DIAGNOSIS — R1084 Generalized abdominal pain: Secondary | ICD-10-CM | POA: Diagnosis not present

## 2020-11-05 DIAGNOSIS — H8112 Benign paroxysmal vertigo, left ear: Secondary | ICD-10-CM | POA: Diagnosis not present

## 2020-11-05 DIAGNOSIS — R10A0 Flank pain, unspecified side: Secondary | ICD-10-CM

## 2020-11-05 LAB — URINALYSIS, ROUTINE W REFLEX MICROSCOPIC
Bilirubin, UA: NEGATIVE
Glucose, UA: NEGATIVE
Nitrite, UA: NEGATIVE
Protein,UA: NEGATIVE
Specific Gravity, UA: 1.015 (ref 1.005–1.030)
Urobilinogen, Ur: 0.2 mg/dL (ref 0.2–1.0)
pH, UA: 6.5 (ref 5.0–7.5)

## 2020-11-05 LAB — CBC WITH DIFFERENTIAL/PLATELET
Hematocrit: 34.8 % (ref 34.0–46.6)
Hemoglobin: 11 g/dL — ABNORMAL LOW (ref 11.1–15.9)
Lymphocytes Absolute: 1.2 10*3/uL (ref 0.7–3.1)
Lymphs: 22 %
MCH: 26.1 pg — ABNORMAL LOW (ref 26.6–33.0)
MCHC: 31.6 g/dL (ref 31.5–35.7)
MCV: 83 fL (ref 79–97)
MID (Absolute): 0.3 10*3/uL (ref 0.1–1.6)
MID: 6 %
Neutrophils Absolute: 3.9 10*3/uL (ref 1.4–7.0)
Neutrophils: 72 %
Platelets: 282 10*3/uL (ref 150–450)
RBC: 4.21 x10E6/uL (ref 3.77–5.28)
RDW: 14.9 % (ref 11.7–15.4)
WBC: 5.4 10*3/uL (ref 3.4–10.8)

## 2020-11-05 LAB — MICROSCOPIC EXAMINATION
Bacteria, UA: NONE SEEN
RBC, Urine: NONE SEEN /hpf (ref 0–2)

## 2020-11-05 MED ORDER — TRIAMCINOLONE ACETONIDE 40 MG/ML IJ SUSP
40.0000 mg | Freq: Once | INTRAMUSCULAR | Status: AC
  Administered 2020-11-05: 40 mg via INTRAMUSCULAR

## 2020-11-05 MED ORDER — CIPROFLOXACIN HCL 500 MG PO TABS: 500.0000 mg | ORAL_TABLET | Freq: Two times a day (BID) | ORAL | 0 refills | Status: DC

## 2020-11-05 NOTE — Progress Notes (Signed)
BP (!) 151/81   Pulse 81   Temp 97.9 F (36.6 C)   Wt 162 lb 9.6 oz (73.8 kg)   SpO2 99%   BMI 31.76 kg/m    Subjective:    Patient ID: Victoria Peterson, female    DOB: [birth year], 80 y.o.   MRN: [id number]  HPI: Victoria Peterson is a 80 y.o. female  Chief Complaint  Patient presents with  . Flank Pain    Patient states for about 3 days she is having right side pain. Pain feels worse when she moves a certain way, especially when standing.   . Tinnitus    Patient states her left ear in ringing and is making her feel dizzy and nauseous.   . Rectal Bleeding    Follow up, patient states to be getting better.    Scheduled for her CT abdomen in 3 days. Stool studies were normal except +hemoccult.   ABDOMINAL PAIN  Duration: 3 days Onset: sudden Severity: severe Quality: sharp Location:  L flank  Episode duration: constant, worse with movment  Radiation: no Frequency: constant Alleviating factors: nothing Aggravating factors: movement Status: worse Treatments attempted: none Fever: no Nausea: yes Vomiting: yes Weight loss: no Decreased appetite: no Diarrhea: no Constipation: no Blood in stool: yes Heartburn: no Jaundice: no Rash: no Dysuria/urinary frequency: no Hematuria: no History of sexually transmitted disease: no Recurrent NSAID use: no  DIZZINESS Duration: this morning Description of symptoms: room spinning Duration of episode: minutes Dizziness frequency: recurrent Provoking factors: head movement Aggravating factors:  head movement Triggered by rolling over in bed: yes Triggered by bending over: yes Aggravated by head movement: yes Aggravated by exertion, coughing, loud noises: no Recent head injury: no Recent or current viral symptoms: no History of vasovagal episodes: no Nausea: yes Vomiting: yes Tinnitus: yes Hearing loss: no Aural fullness: yes Headache: no Photophobia/phonophobia: no Unsteady gait: no Postural instability:  no Diplopia, dysarthria, dysphagia or weakness: no Related to exertion: no Pallor: no Diaphoresis: no Dyspnea: no Chest pain: no   Relevant past medical, surgical, family and social history reviewed and updated as indicated. Interim medical history since our last visit reviewed. Allergies and medications reviewed and updated.  Review of Systems  Constitutional: Negative.   Respiratory: Negative.   Cardiovascular: Negative.   Gastrointestinal: Positive for abdominal pain, anal bleeding and blood in stool. Negative for abdominal distention, constipation, diarrhea, nausea, rectal pain and vomiting.  Genitourinary: Positive for flank pain. Negative for decreased urine volume, difficulty urinating, dyspareunia, dysuria, enuresis, frequency, genital sores, hematuria, menstrual problem, pelvic pain, urgency, vaginal bleeding, vaginal discharge and vaginal pain.  Neurological: Positive for dizziness and light-headedness. Negative for tremors, seizures, syncope, facial asymmetry, speech difficulty, weakness, numbness and headaches.  Hematological: Negative.   Psychiatric/Behavioral: Negative.     Per HPI unless specifically indicated above     Objective:    BP (!) 151/81   Pulse 81   Temp 97.9 F (36.6 C)   Wt 162 lb 9.6 oz (73.8 kg)   SpO2 99%   BMI 31.76 kg/m   Wt Readings from Last 3 Encounters:  11/05/20 162 lb 9.6 oz (73.8 kg)  10/21/20 161 lb 3.2 oz (73.1 kg)  10/16/20 160 lb (72.6 kg)    Physical Exam Vitals and nursing note reviewed.  Constitutional:      General: She is not in acute distress.    Appearance: Normal appearance. She is not ill-appearing, toxic-appearing or diaphoretic.  HENT:     Head:  Normocephalic and atraumatic.     Right Ear: External ear normal.     Left Ear: External ear normal.     Nose: Nose normal.     Mouth/Throat:     Mouth: Mucous membranes are moist.     Pharynx: Oropharynx is clear.  Eyes:     General: No scleral icterus.       Right  eye: No discharge.        Left eye: No discharge.     Extraocular Movements: Extraocular movements intact.     Conjunctiva/sclera: Conjunctivae normal.     Pupils: Pupils are equal, round, and reactive to light.  Cardiovascular:     Rate and Rhythm: Normal rate and regular rhythm.     Pulses: Normal pulses.     Heart sounds: Normal heart sounds. No murmur heard. No friction rub. No gallop.   Pulmonary:     Effort: Pulmonary effort is normal. No respiratory distress.     Breath sounds: Normal breath sounds. No stridor. No wheezing, rhonchi or rales.  Chest:     Chest wall: No tenderness.  Abdominal:     General: Abdomen is flat. Bowel sounds are normal. There is no distension.     Palpations: Abdomen is soft. There is no mass.     Tenderness: There is abdominal tenderness. There is right CVA tenderness. There is no left CVA tenderness, guarding or rebound.     Hernia: No hernia is present.  Musculoskeletal:        General: Normal range of motion.     Cervical back: Normal range of motion and neck supple.  Skin:    General: Skin is warm and dry.     Capillary Refill: Capillary refill takes less than 2 seconds.     Coloration: Skin is not jaundiced or pale.     Findings: No bruising, erythema, lesion or rash.  Neurological:     General: No focal deficit present.     Mental Status: She is alert and oriented to person, place, and time. Mental status is at baseline.  Psychiatric:        Mood and Affect: Mood normal.        Behavior: Behavior normal.        Thought Content: Thought content normal.        Judgment: Judgment normal.     Results for orders placed or performed in visit on 10/21/20  Fecal occult blood, imunochemical([facility])   Specimen: Stool   ST  Result Value Ref Range   Fecal Occult Bld Positive (A) Negative  Fecal leukocytes   Specimen: Stool   ST     CD- [id number] V  Result Value Ref Range   White Blood Cells (WBC), Stool Final report None Seen    Result 1 Comment   Stool Culture   Specimen: Stool   ST     CD- [id number] V  Result Value Ref Range   Salmonella/Shigella Screen Final report    Stool Culture result 1 ([facility]) Comment    Campylobacter Culture Final report    Stool Culture result 1 ([facility]) Comment    E coli, Shiga toxin Assay Negative Negative  Clostridium difficile EIA   ST     CD- [id number] V  Result Value Ref Range   C difficile Toxins A+B, EIA Negative Negative  Ova and parasite examination   ST     CD- [id number] V  Result Value Ref Range   OVA + PARASITE EXAM Final report  O&P result 1 Comment   Comprehensive metabolic panel  Result Value Ref Range   Glucose 112 (H) 65 - 99 mg/dL   BUN 15 8 - 27 mg/dL   Creatinine, Ser 0.83 0.57 - 1.00 mg/dL   eGFR 72 >59 mL/min/1.73   BUN/Creatinine Ratio 18 12 - 28   Sodium 144 134 - 144 mmol/L   Potassium 3.8 3.5 - 5.2 mmol/L   Chloride 109 (H) 96 - 106 mmol/L   CO2 21 20 - 29 mmol/L   Calcium 9.5 8.7 - 10.3 mg/dL   Total Protein 6.8 6.0 - 8.5 g/dL   Albumin 4.4 3.7 - 4.7 g/dL   Globulin, Total 2.4 1.5 - 4.5 g/dL   Albumin/Globulin Ratio 1.8 1.2 - 2.2   Bilirubin Total 0.5 0.0 - 1.2 mg/dL   Alkaline Phosphatase 66 44 - 121 IU/L   AST 21 0 - 40 IU/L   ALT 14 0 - 32 IU/L  CBC with Differential/Platelet  Result Value Ref Range   WBC 4.6 3.4 - 10.8 x10E3/uL   RBC 4.41 3.77 - 5.28 x10E6/uL   Hemoglobin 11.4 11.1 - 15.9 g/dL   Hematocrit 34.6 34.0 - 46.6 %   MCV 79 79 - 97 fL   MCH 25.9 (L) 26.6 - 33.0 pg   MCHC 32.9 31.5 - 35.7 g/dL   RDW 14.0 11.7 - 15.4 %   Platelets 297 150 - 450 x10E3/uL   Neutrophils 54 Not Estab. %   Lymphs 35 Not Estab. %   Monocytes 8 Not Estab. %   Eos 2 Not Estab. %   Basos 1 Not Estab. %   Neutrophils Absolute 2.5 1.4 - 7.0 x10E3/uL   Lymphocytes Absolute 1.6 0.7 - 3.1 x10E3/uL   Monocytes Absolute 0.4 0.1 - 0.9 x10E3/uL   EOS (ABSOLUTE) 0.1 0.0 - 0.4 x10E3/uL   Basophils Absolute 0.0 0.0 - 0.2 x10E3/uL   Immature Granulocytes  0 Not Estab. %   Immature Grans (Abs) 0.0 0.0 - 0.1 x10E3/uL      Assessment & Plan:   Problem List Items Addressed This Visit   None   Visit Diagnoses    Flank pain    -  Primary   Still has not gotten her CT- scheduled for Friday. Await results. Checking CBC and UA today in office. 2+ leuks- will treat with cipro and await culture.    Relevant Orders   Urinalysis, Routine w reflex microscopic   CBC With Differential/Platelet   Urine Culture   Benign paroxysmal positional vertigo of left ear       Likely exacerbated due to allergies. Will treat with triamcinalone shot and epley's manuvers. Call with any concerns.    Relevant Medications   triamcinolone acetonide (KENALOG-40) injection 40 mg (Completed)   Generalized abdominal pain       Awaiting CT abdomen. Call with any concerns.        Follow up plan: Return Pending results.

## 2020-11-05 NOTE — Patient Instructions (Signed)
How to Perform the Epley Maneuver The Epley maneuver is an exercise that relieves symptoms of vertigo. Vertigo is the feeling that you or your surroundings are moving when they are not. When you feel vertigo, you may feel like the room is spinning and may have trouble walking. The Epley maneuver is used for a type of vertigo caused by a calcium deposit in a part of the inner ear. The maneuver involves changing head positions to help the deposit move out of the area. You can do this maneuver at home whenever you have symptoms of vertigo. You can repeat it in 24 hours if your vertigo has not gone away. Even though the Epley maneuver may relieve your vertigo for a few weeks, it is possible that your symptoms will return. This maneuver relieves vertigo, but it does not relieve dizziness. What are the risks? If it is done correctly, the Epley maneuver is considered safe. Sometimes it can lead to dizziness or nausea that goes away after a short time. If you develop other symptoms--such as changes in vision, weakness, or numbness--stop doing the maneuver and call your health care provider. Supplies needed:  A bed or table.  A pillow. How to do the Epley maneuver 1. Sit on the edge of a bed or table with your back straight and your legs extended or hanging over the edge of the bed or table. 2. Turn your head halfway toward the affected ear or side as told by your health care provider. 3. Lie backward quickly with your head turned until you are lying flat on your back. You may want to position a pillow under your shoulders. 4. Hold this position for at least 30 seconds. If you feel dizzy or have symptoms of vertigo, continue to hold the position until the symptoms stop. 5. Turn your head to the opposite direction until your unaffected ear is facing the floor. 6. Hold this position for at least 30 seconds. If you feel dizzy or have symptoms of vertigo, continue to hold the position until the symptoms  stop. 7. Turn your whole body to the same side as your head so that you are positioned on your side. Your head will now be nearly facedown. Hold for at least 30 seconds. If you feel dizzy or have symptoms of vertigo, continue to hold the position until the symptoms stop. 8. Sit back up. You can repeat the maneuver in 24 hours if your vertigo does not go away.      Follow these instructions at home: For 24 hours after doing the Epley maneuver:  Keep your head in an upright position.  When lying down to sleep or rest, keep your head raised (elevated) with two or more pillows.  Avoid excessive neck movements. Activity  Do not drive or use machinery if you feel dizzy.  After doing the Epley maneuver, return to your normal activities as told by your health care provider. Ask your health care provider what activities are safe for you. General instructions  Drink enough fluid to keep your urine pale yellow.  Do not drink alcohol.  Take over-the-counter and prescription medicines only as told by your health care provider.  Keep all follow-up visits as told by your health care provider. This is important. Preventing vertigo symptoms Ask your health care provider if there is anything you should do at home to prevent vertigo. He or she may recommend that you:  Keep your head elevated with two or more pillows while you sleep.  Do not sleep on the side of your affected ear.  Get up slowly from bed.  Avoid sudden movements during the day.  Avoid extreme head positions or movement, such as looking up or bending over. Contact a health care provider if:  Your vertigo gets worse.  You have other symptoms, including: ? Nausea. ? Vomiting. ? Headache. Get help right away if you:  Have vision changes.  Have a headache or neck pain that is severe or getting worse.  Cannot stop vomiting.  Have new numbness or weakness in any part of your body. Summary  Vertigo is the feeling that  you or your surroundings are moving when they are not.  The Epley maneuver is an exercise that relieves symptoms of vertigo.  If the Epley maneuver is done correctly, it is considered safe and relieves vertigo quickly. This information is not intended to replace advice given to you by your health care provider. Make sure you discuss any questions you have with your health care provider. Document Revised: 05/17/2019 Document Reviewed: 05/17/2019 Elsevier Patient Education  2021 [employer].

## 2020-11-06 DIAGNOSIS — R208 Other disturbances of skin sensation: Secondary | ICD-10-CM | POA: Diagnosis not present

## 2020-11-06 DIAGNOSIS — G2581 Restless legs syndrome: Secondary | ICD-10-CM | POA: Diagnosis not present

## 2020-11-06 DIAGNOSIS — G959 Disease of spinal cord, unspecified: Secondary | ICD-10-CM | POA: Diagnosis not present

## 2020-11-07 LAB — URINE CULTURE

## 2020-11-08 ENCOUNTER — Other Ambulatory Visit: Payer: Self-pay

## 2020-11-08 ENCOUNTER — Ambulatory Visit
Admission: RE | Admit: 2020-11-08 | Discharge: 2020-11-08 | Disposition: A | Discharge status: Home / Self Care | Payer: Medicare Other | Source: Ambulatory Visit | Attending: Family Medicine | Admitting: Family Medicine

## 2020-11-08 DIAGNOSIS — R1084 Generalized abdominal pain: Secondary | ICD-10-CM | POA: Insufficient documentation

## 2020-11-08 DIAGNOSIS — K529 Noninfective gastroenteritis and colitis, unspecified: Secondary | ICD-10-CM | POA: Diagnosis not present

## 2020-11-08 DIAGNOSIS — K625 Hemorrhage of anus and rectum: Secondary | ICD-10-CM | POA: Diagnosis not present

## 2020-11-08 DIAGNOSIS — K5100 Ulcerative (chronic) pancolitis without complications: Secondary | ICD-10-CM | POA: Diagnosis not present

## 2020-11-08 DIAGNOSIS — K5190 Ulcerative colitis, unspecified, without complications: Secondary | ICD-10-CM | POA: Diagnosis not present

## 2020-11-08 DIAGNOSIS — R194 Change in bowel habit: Secondary | ICD-10-CM | POA: Diagnosis not present

## 2020-11-08 MED ORDER — IOHEXOL 300 MG/ML  SOLN
100.0000 mL | Freq: Once | INTRAMUSCULAR | Status: AC | PRN
  Administered 2020-11-08: 100 mL via INTRAVENOUS

## 2020-11-08 NOTE — Progress Notes (Signed)
Hey Dr. Erlene Quan, Victoria Peterson came in with abdominal pain that then localized to L flank pain. We were concerned about a diverticulitis or colitis due to blood in her stool so we got a CT abdomen. Thankfully her colon looks great, but it does look like she has another stone in her L collecting duct. I wanted to make sure I passed her results along. I'll have her call your office on Monday. Have a great day!   Patient aware. Declines pain meds now

## 2020-11-26 DIAGNOSIS — Z981 Arthrodesis status: Secondary | ICD-10-CM | POA: Diagnosis not present

## 2020-11-27 DIAGNOSIS — M4322 Fusion of spine, cervical region: Secondary | ICD-10-CM | POA: Diagnosis not present

## 2020-11-27 DIAGNOSIS — Z981 Arthrodesis status: Secondary | ICD-10-CM | POA: Diagnosis not present

## 2020-12-09 DIAGNOSIS — R200 Anesthesia of skin: Secondary | ICD-10-CM | POA: Diagnosis not present

## 2020-12-17 ENCOUNTER — Other Ambulatory Visit: Payer: Self-pay

## 2020-12-17 ENCOUNTER — Telehealth: Payer: Self-pay | Admitting: Family Medicine

## 2020-12-17 ENCOUNTER — Ambulatory Visit (INDEPENDENT_AMBULATORY_CARE_PROVIDER_SITE_OTHER): Payer: Medicare Other | Admitting: Family Medicine

## 2020-12-17 ENCOUNTER — Encounter: Payer: Self-pay | Admitting: Family Medicine

## 2020-12-17 VITALS — BP 126/75 | HR 96 | Temp 98.1°F | Ht 59.75 in | Wt 159.0 lb

## 2020-12-17 DIAGNOSIS — R8281 Pyuria: Secondary | ICD-10-CM

## 2020-12-17 DIAGNOSIS — Z0000 Encounter for general adult medical examination without abnormal findings: Secondary | ICD-10-CM | POA: Diagnosis not present

## 2020-12-17 DIAGNOSIS — R7303 Prediabetes: Secondary | ICD-10-CM

## 2020-12-17 DIAGNOSIS — Z136 Encounter for screening for cardiovascular disorders: Secondary | ICD-10-CM

## 2020-12-17 DIAGNOSIS — S41101A Unspecified open wound of right upper arm, initial encounter: Secondary | ICD-10-CM | POA: Diagnosis not present

## 2020-12-17 DIAGNOSIS — G959 Disease of spinal cord, unspecified: Secondary | ICD-10-CM | POA: Diagnosis not present

## 2020-12-17 DIAGNOSIS — N200 Calculus of kidney: Secondary | ICD-10-CM

## 2020-12-17 DIAGNOSIS — Z23 Encounter for immunization: Secondary | ICD-10-CM | POA: Diagnosis not present

## 2020-12-17 DIAGNOSIS — Z932 Ileostomy status: Secondary | ICD-10-CM | POA: Diagnosis not present

## 2020-12-17 DIAGNOSIS — G2581 Restless legs syndrome: Secondary | ICD-10-CM | POA: Diagnosis not present

## 2020-12-17 MED ORDER — ROPINIROLE HCL 0.5 MG PO TABS: 0.5000 mg | ORAL_TABLET | Freq: Every day | ORAL | 1 refills | Status: DC

## 2020-12-17 NOTE — Telephone Encounter (Signed)
Can you please check behind me, I do not see that she has had any of these done.

## 2020-12-17 NOTE — Telephone Encounter (Signed)
Please see Dr.Johnson's message below

## 2020-12-17 NOTE — Telephone Encounter (Signed)
We don't have any in our chart or in care everywhere- we can check with the patient as to where she was >5 years ago and have her sign a records release to go over to them, but we don't have anything to send. We can send her CT if they want that.

## 2020-12-17 NOTE — Assessment & Plan Note (Signed)
Following with neurology and neurosurgery. Continue to monitor. Call with any concerns.

## 2020-12-17 NOTE — Assessment & Plan Note (Signed)
Continue to follow with urology. Continue to monitor. Call with any concerns.

## 2020-12-17 NOTE — Assessment & Plan Note (Signed)
Doing well with A1c of 5.8- continue diet and exercise. Call with any concerns.

## 2020-12-17 NOTE — Patient Instructions (Addendum)
Call to schedule your Bone Density [facility]  Address: [address], [place], [place] [address]  Phone: [phone]   Health Maintenance After Age 78 After age 43, you are at a higher risk for certain long-term diseases and infections as well as injuries from falls. Falls are a major cause of broken bones and head injuries in people who are older than age 67. Getting regular preventive care can help to keep you healthy and well. Preventive care includes getting regular testing and making lifestyle changes as recommended by your health care provider. Talk with your health care provider about:  Which screenings and tests you should have. A screening is a test that checks for a disease when you have no symptoms.  A diet and exercise plan that is right for you. What should I know about screenings and tests to prevent falls? Screening and testing are the best ways to find a health problem early. Early diagnosis and treatment give you the best chance of managing medical conditions that are common after age 76. Certain conditions and lifestyle choices may make you more likely to have a fall. Your health care provider may recommend:  Regular vision checks. Poor vision and conditions such as cataracts can make you more likely to have a fall. If you wear glasses, make sure to get your prescription updated if your vision changes.  Medicine review. Work with your health care provider to regularly review all of the medicines you are taking, including over-the-counter medicines. Ask your health care provider about any side effects that may make you more likely to have a fall. Tell your health care provider if any medicines that you take make you feel dizzy or sleepy.  Osteoporosis screening. Osteoporosis is a condition that causes the bones to get weaker. This can make the bones weak and cause them to break more easily.  Blood pressure screening. Blood pressure  changes and medicines to control blood pressure can make you feel dizzy.  Strength and balance checks. Your health care provider may recommend certain tests to check your strength and balance while standing, walking, or changing positions.  Foot health exam. Foot pain and numbness, as well as not wearing proper footwear, can make you more likely to have a fall.  Depression screening. You may be more likely to have a fall if you have a fear of falling, feel emotionally low, or feel unable to do activities that you used to do.  Alcohol use screening. Using too much alcohol can affect your balance and may make you more likely to have a fall. What actions can I take to lower my risk of falls? General instructions  Talk with your health care provider about your risks for falling. Tell your health care provider if: ? You fall. Be sure to tell your health care provider about all falls, even ones that seem minor. ? You feel dizzy, sleepy, or off-balance.  Take over-the-counter and prescription medicines only as told by your health care provider. These include any supplements.  Eat a healthy diet and maintain a healthy weight. A healthy diet includes low-fat dairy products, low-fat (lean) meats, and fiber from whole grains, beans, and lots of fruits and vegetables. Home safety  Remove any tripping hazards, such as rugs, cords, and clutter.  Install safety equipment such as grab bars in bathrooms and safety rails on stairs.  Keep rooms and walkways well-lit. Activity  Follow a regular exercise program to stay fit.  This will help you maintain your balance. Ask your health care provider what types of exercise are appropriate for you.  If you need a cane or walker, use it as recommended by your health care provider.  Wear supportive shoes that have nonskid soles.   Lifestyle  Do not drink alcohol if your health care provider tells you not to drink.  If you drink alcohol, limit how much you  have: ? 0-1 drink a day for women. ? 0-2 drinks a day for men.  Be aware of how much alcohol is in your drink. In the [place] one drink equals one typical bottle of beer (12 oz), one-half glass of wine (5 oz), or one shot of hard liquor (1 oz).  Do not use any products that contain nicotine or tobacco, such as cigarettes and e-cigarettes. If you need help quitting, ask your health care provider. Summary  Having a healthy lifestyle and getting preventive care can help to protect your health and wellness after age 79.  Screening and testing are the best way to find a health problem early and help you avoid having a fall. Early diagnosis and treatment give you the best chance for managing medical conditions that are more common for people who are older than age 8.  Falls are a major cause of broken bones and head injuries in people who are older than age 15. Take precautions to prevent a fall at home.  Work with your health care provider to learn what changes you can make to improve your health and wellness and to prevent falls. This information is not intended to replace advice given to you by your health care provider. Make sure you discuss any questions you have with your health care provider. Document Revised: 11/10/2018 Document Reviewed: 06/02/2017 Elsevier Patient Education  2021 [place]. Td (Tetanus, Diphtheria) Vaccine: What You Need to Know 1. Why get vaccinated? Td vaccine can prevent tetanus and diphtheria. Tetanus enters the body through cuts or wounds. Diphtheria spreads from person to person.  TETANUS (T) causes painful stiffening of the muscles. Tetanus can lead to serious health problems, including being unable to open the mouth, having trouble swallowing and breathing, or death.  DIPHTHERIA (D) can lead to difficulty breathing, heart failure, paralysis, or death. 2. Td vaccine Td is only for children 7 years and older, adolescents, and adults.  Td is usually given as  a booster dose every 10 years, or after 5 years in the case of a severe or dirty wound or burn. Another vaccine, called "Tdap," may be used instead of Td. Tdap protects against pertussis, also known as "whooping cough," in addition to tetanus and diphtheria. Td may be given at the same time as other vaccines. 3. Talk with your health care provider Tell your vaccination provider if the person getting the vaccine:  Has had an allergic reaction after a previous dose of any vaccine that protects against tetanus or diphtheria, or has any severe, life-threatening allergies  Has ever had Guillain-Barr Syndrome (also called "GBS")  Has had severe pain or swelling after a previous dose of any vaccine that protects against tetanus or diphtheria In some cases, your health care provider may decide to postpone Td vaccination until a future visit. People with minor illnesses, such as a cold, may be vaccinated. People who are moderately or severely ill should usually wait until they recover before getting Td vaccine.  Your health care provider can give you more information. 4. Risks of a vaccine  reaction  Pain, redness, or swelling where the shot was given, mild fever, headache, feeling tired, and nausea, vomiting, diarrhea, or stomachache sometimes happen after Td vaccination. People sometimes faint after medical procedures, including vaccination. Tell your provider if you feel dizzy or have vision changes or ringing in the ears.  As with any medicine, there is a very remote chance of a vaccine causing a severe allergic reaction, other serious injury, or death. 5. What if there is a serious problem? An allergic reaction could occur after the vaccinated person leaves the clinic. If you see signs of a severe allergic reaction (hives, swelling of the face and throat, difficulty breathing, a fast heartbeat, dizziness, or weakness), call 9-1-1 and get the person to the nearest hospital.  For other signs that  concern you, call your health care provider.  Adverse reactions should be reported to the Vaccine Adverse Event Reporting System (VAERS). Your health care provider will usually file this report, or you can do it yourself. Visit the VAERS website at www.vaers.SamedayNews.es or call 256 430 3001. VAERS is only for reporting reactions, and VAERS staff members do not give medical advice. 6. The National Vaccine Injury Compensation Program The [employer] Vaccine Injury Compensation Program (VICP) is a federal program that was created to compensate people who may have been injured by certain vaccines. Claims regarding alleged injury or death due to vaccination have a time limit for filing, which may be as short as two years. Visit the VICP website at GoldCloset.com.ee or call 260-642-1475 to learn about the program and about filing a claim. 7. How can I learn more?  Ask your health care provider.  Call your local or state health department.  Visit the website of the Food and Drug Administration (FDA) for vaccine package inserts and additional information at TraderRating.uy.  Contact the Centers for Disease Control and Prevention (CDC): ? Call (804)567-8231 (1-800-CDC-INFO) or ? Visit CDC's website at http://hunter.com/. Vaccine Information Statement Td (Tetanus, Diphtheria) Vaccine (03/08/2020) This information is not intended to replace advice given to you by your health care provider. Make sure you discuss any questions you have with your health care provider. Document Revised: 04/25/2020 Document Reviewed: 04/25/2020 Elsevier Patient Education  2021 [employer].

## 2020-12-17 NOTE — Assessment & Plan Note (Signed)
Awaiting appointment with GI. Await appointment. Labs drawn today.
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
## 2020-12-17 NOTE — Telephone Encounter (Signed)
Called [facility] to check status of referral from 10/22/20. They stated that they fax a medical request to the office. They can not schedule until this is sent in. They are needing last colonoscopy, endoscopy and any last imaging related. I did not have access to send. Can someone from the office please send?   Fax# [phone]

## 2020-12-17 NOTE — Assessment & Plan Note (Signed)
Under good control on current regimen. Continue current regimen. Continue to monitor. Call with any concerns. Refills given.   

## 2020-12-18 LAB — URINALYSIS, ROUTINE W REFLEX MICROSCOPIC
Bilirubin, UA: NEGATIVE
Glucose, UA: NEGATIVE
Ketones, UA: NEGATIVE
Nitrite, UA: NEGATIVE
Protein,UA: NEGATIVE
Specific Gravity, UA: 1.025 (ref 1.005–1.030)
Urobilinogen, Ur: 0.2 mg/dL (ref 0.2–1.0)
pH, UA: 5 (ref 5.0–7.5)

## 2020-12-18 LAB — MICROSCOPIC EXAMINATION

## 2020-12-18 LAB — LIPID PANEL W/O CHOL/HDL RATIO
Cholesterol, Total: 247 mg/dL — ABNORMAL HIGH (ref 100–199)
HDL: 92 mg/dL (ref >39)
LDL Chol Calc (NIH): 128 mg/dL — ABNORMAL HIGH (ref 0–99)
Triglycerides: 159 mg/dL — ABNORMAL HIGH (ref 0–149)
VLDL Cholesterol Cal: 27 mg/dL (ref 5–40)

## 2020-12-18 LAB — COMPREHENSIVE METABOLIC PANEL
ALT: 17 IU/L (ref 0–32)
AST: 22 IU/L (ref 0–40)
Albumin/Globulin Ratio: 1.8 (ref 1.2–2.2)
Albumin: 4.7 g/dL (ref 3.7–4.7)
Alkaline Phosphatase: 72 IU/L (ref 44–121)
BUN/Creatinine Ratio: 13 (ref 12–28)
BUN: 12 mg/dL (ref 8–27)
Bilirubin Total: 0.5 mg/dL (ref 0.0–1.2)
CO2: 22 mmol/L (ref 20–29)
Calcium: 9.7 mg/dL (ref 8.7–10.3)
Chloride: 109 mmol/L — ABNORMAL HIGH (ref 96–106)
Creatinine, Ser: 0.93 mg/dL (ref 0.57–1.00)
Globulin, Total: 2.6 g/dL (ref 1.5–4.5)
Glucose: 93 mg/dL (ref 65–99)
Potassium: 4.1 mmol/L (ref 3.5–5.2)
Sodium: 150 mmol/L — ABNORMAL HIGH (ref 134–144)
Total Protein: 7.3 g/dL (ref 6.0–8.5)
eGFR: 63 mL/min/{1.73_m2} (ref >59)

## 2020-12-18 LAB — CBC WITH DIFFERENTIAL/PLATELET
Basophils Absolute: 0 10*3/uL (ref 0.0–0.2)
Basos: 1 %
EOS (ABSOLUTE): 0.1 10*3/uL (ref 0.0–0.4)
Eos: 1 %
Hematocrit: 37.2 % (ref 34.0–46.6)
Hemoglobin: 12.1 g/dL (ref 11.1–15.9)
Immature Grans (Abs): 0 10*3/uL (ref 0.0–0.1)
Immature Granulocytes: 0 %
Lymphocytes Absolute: 1.9 10*3/uL (ref 0.7–3.1)
Lymphs: 27 %
MCH: 25.3 pg — ABNORMAL LOW (ref 26.6–33.0)
MCHC: 32.5 g/dL (ref 31.5–35.7)
MCV: 78 fL — ABNORMAL LOW (ref 79–97)
Monocytes Absolute: 0.5 10*3/uL (ref 0.1–0.9)
Monocytes: 7 %
Neutrophils Absolute: 4.5 10*3/uL (ref 1.4–7.0)
Neutrophils: 64 %
Platelets: 285 10*3/uL (ref 150–450)
RBC: 4.79 x10E6/uL (ref 3.77–5.28)
RDW: 14.6 % (ref 11.7–15.4)
WBC: 7 10*3/uL (ref 3.4–10.8)

## 2020-12-18 LAB — BAYER DCA HB A1C WAIVED: HB A1C (BAYER DCA - WAIVED): 5.8 % (ref <7.0)

## 2020-12-18 LAB — TSH: TSH: 0.734 u[IU]/mL (ref 0.450–4.500)

## 2020-12-21 LAB — URINE CULTURE

## 2020-12-23 ENCOUNTER — Other Ambulatory Visit: Payer: Self-pay | Admitting: Family Medicine

## 2020-12-23 MED ORDER — NITROFURANTOIN MONOHYD MACRO 100 MG PO CAPS: 100.0000 mg | ORAL_CAPSULE | Freq: Two times a day (BID) | ORAL | 0 refills | Status: DC

## 2020-12-23 NOTE — Telephone Encounter (Signed)
Spoke with [employer]. They stated they will take another look at referral and schedule.

## 2021-03-02 IMAGING — CT CT ABD-PELV W/ CM
2 of 5 series · 14 of 46 positions shown, 16 images · IV contrast (omnipaque)
Comparison: Abdominal radiographs 10/14/2019.

CLINICAL DATA: 79-year-old female with left lower quadrant
abdominal pain for 4 days. History of ileostomy.

EXAM:
CT ABDOMEN AND PELVIS WITH CONTRAST
TECHNIQUE: Multidetector CT imaging of the abdomen and pelvis was performed
using the standard protocol following bolus administration of
intravenous contrast.
CONTRAST:  100mL OMNIPAQUE IOHEXOL 300 MG/ML  SOLN

[Series 2: routine abd/pel with · axial · 0.71mm/px · z∈[-410,+0]mm · 11 of 92 slices shown, 13 images]
[im 5/92  soft-tissue]
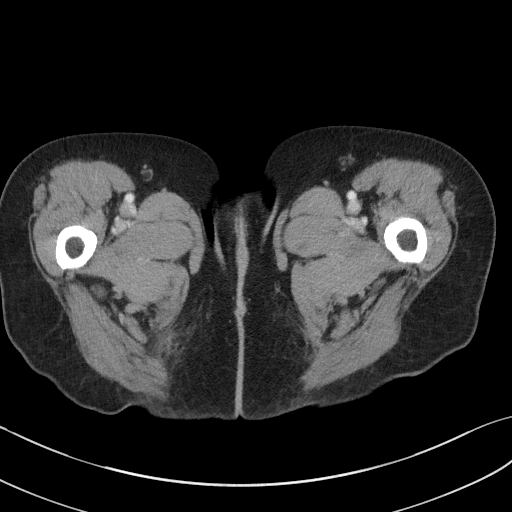
[im 5/92  bone]
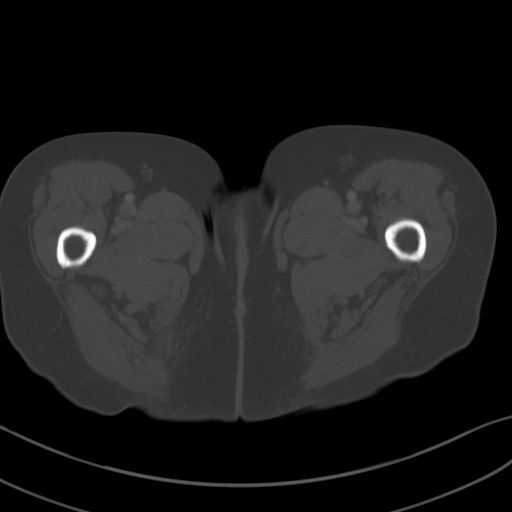
[im 15/92  soft-tissue]
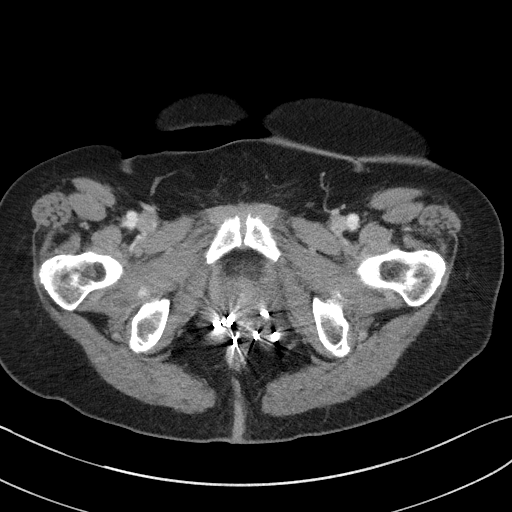
[im 24/92  soft-tissue]
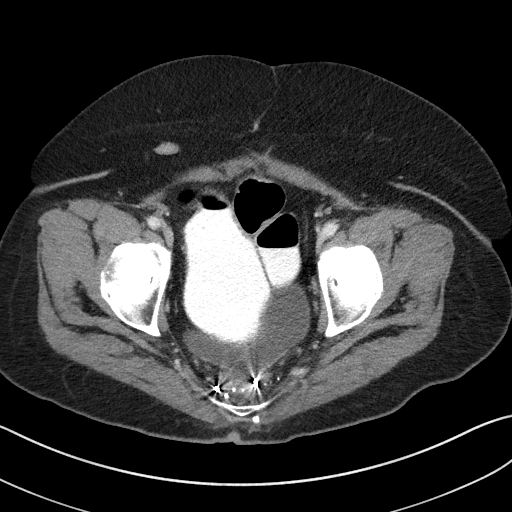
[im 29/92  soft-tissue]
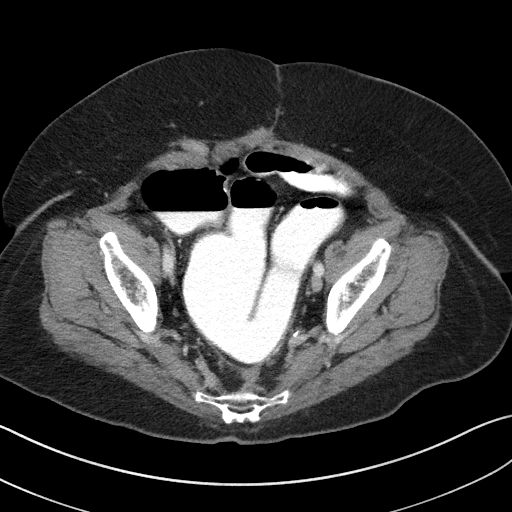
[im 39/92  soft-tissue]
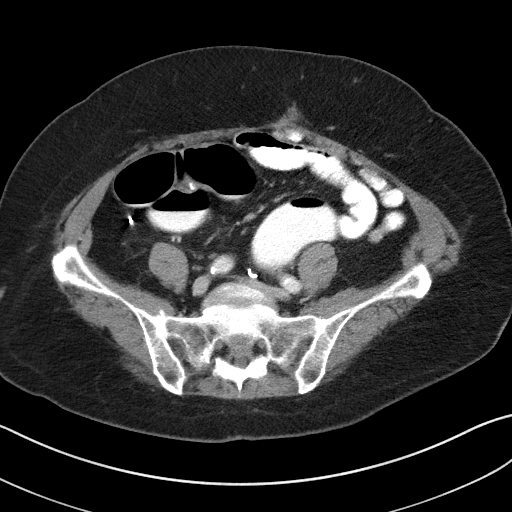
[im 48/92  soft-tissue]
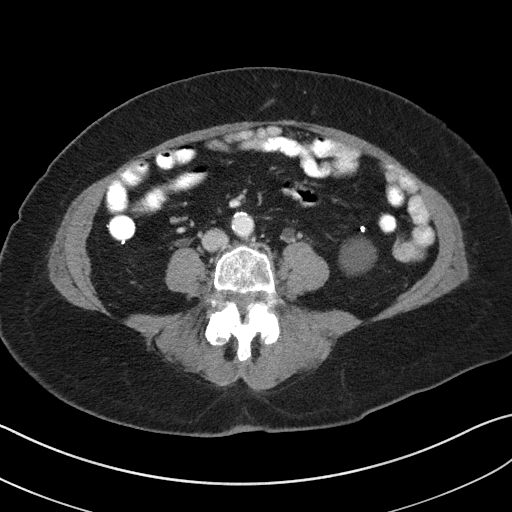
[im 53/92  soft-tissue]
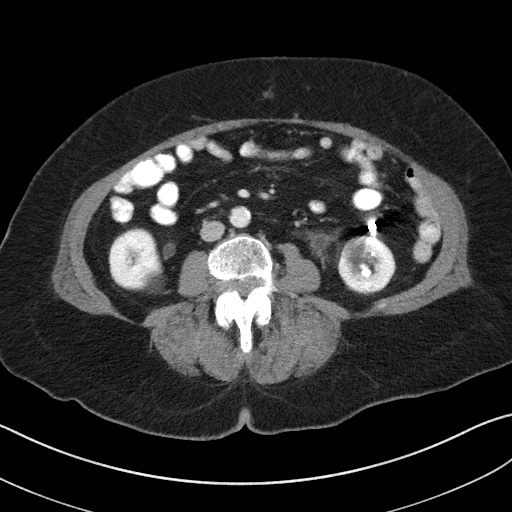
[im 63/92  soft-tissue]
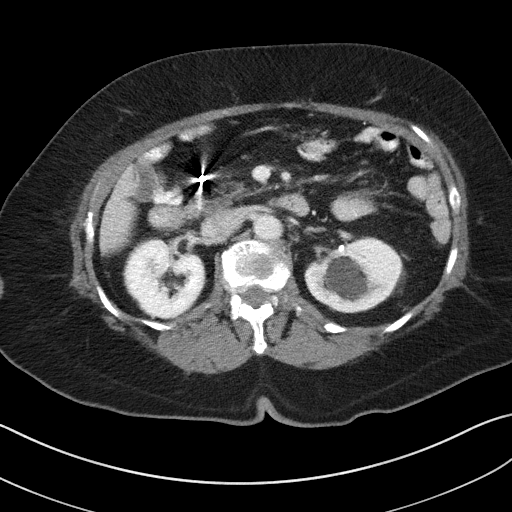
[im 68/92  soft-tissue]
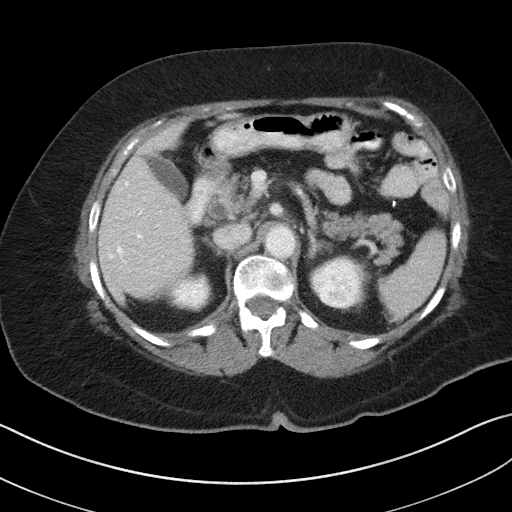
[im 68/92  bone]
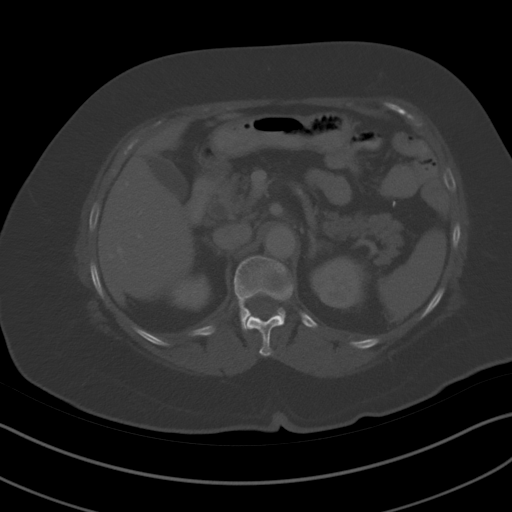
[im 77/92  soft-tissue]
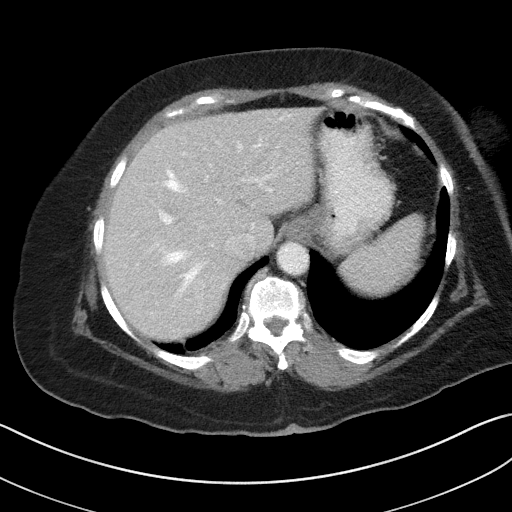
[im 87/92  soft-tissue]
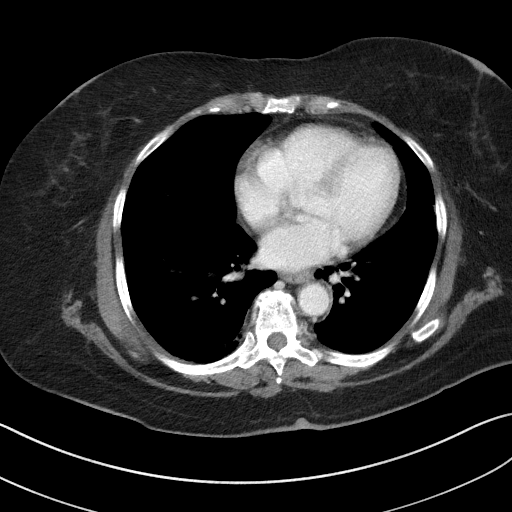

[Series 5: coronal st · coronal · 0.70mm/px · 3 of 99 slices shown]
[im 33/99  soft-tissue]
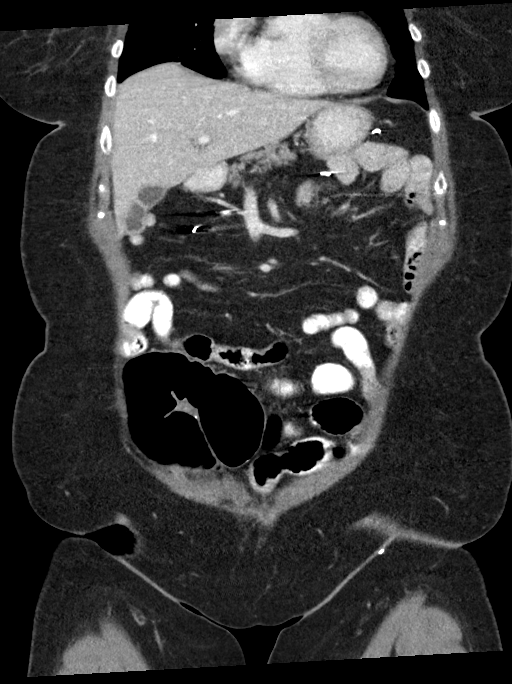
[im 44/99  soft-tissue]
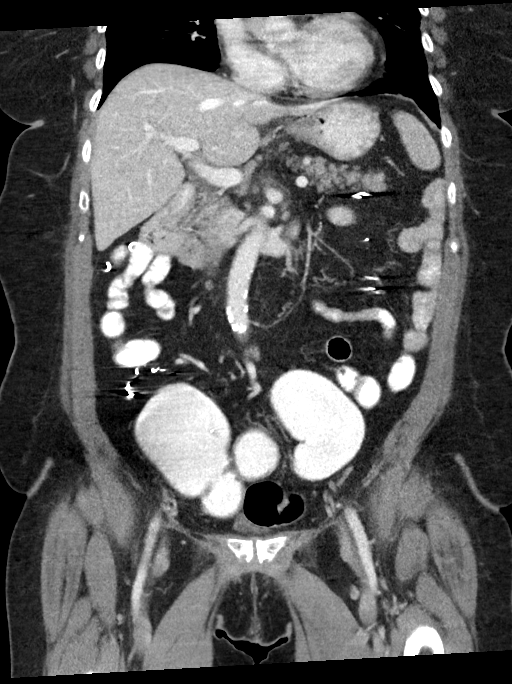
[im 55/99  soft-tissue]
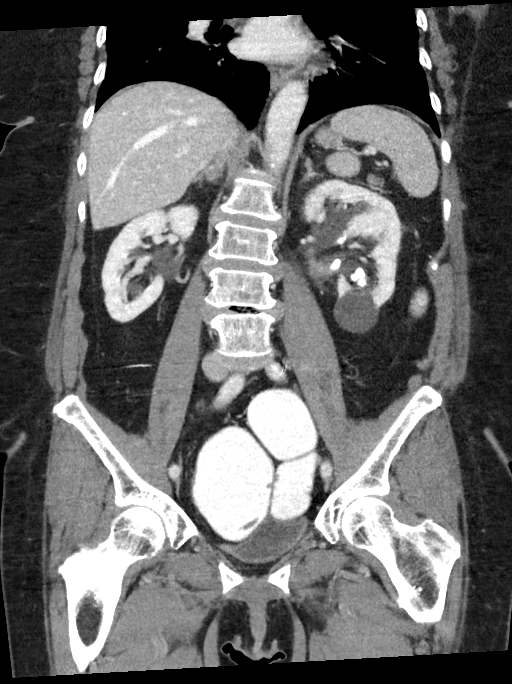

[14 of 46 positions shown; findings below may reference images not displayed]

FINDINGS: Lower chest: Cardiac size within normal limits. No pericardial or
pleural effusion. Mild lung base atelectasis versus scarring.

Hepatobiliary: Liver and gallbladder are within normal limits.

Pancreas: Negative.

Spleen: Negative.

Adrenals/Urinary Tract: Normal adrenal glands.

Renal contrast enhancement and on delayed images contrast excretion
appears relatively symmetric. However, there is a large roughly
cm staghorn calculus occupying the left renal pelvis and
ureteropelvic junction (coronal image 53 and series 2, image 36)
with parapelvic and proximal periureteral inflammatory stranding.
And there is a mild to moderate degree of left renal hydronephrosis
despite the relatively symmetric nephrograms.

Additional bulky left renal calculi individually up to 14 mm (lower
pole coronal image 56).

No right nephrolithiasis or hydronephrosis. Superimposed bilateral
benign appearing renal cysts.

The left ureter appears decompressed beyond its crossing of the
iliac vessels. The right ureter seems to remain within normal
limits. Unremarkable urinary bladder.

Stomach/Bowel: Numerous surgical clips throughout the abdominal and
pelvic mesentery. Apparent blind-ending rectum in the deep pelvis
(series 2, image 70) with no residual large bowel identified in the
abdomen or at the pelvic inlet.

Large J pouch in the central lower abdomen and pelvis which is
opacified with oral contrast (series 2, image 65). And along the
right superior border of a portion of the J pouch is a completely
decompressed ileostomy loop (coronal images 29 through 42). This
appears to connect to an ostomy bag located in the right abdominal
panniculus.

Upstream of the J pouch none of the small bowel is abnormally
dilated. Stomach and duodenum appear within normal limits. No free
air, free fluid, mesenteric stranding.

Superimposed chronic postoperative changes to the ventral lower
abdominal wall.

Vascular/Lymphatic: Aortoiliac calcified atherosclerosis. Major
arterial structures remain patent. Portal venous system is patent.
No lymphadenopathy.

Reproductive: Surgically absent.

Other: Extensive surgical clips along the pelvic floor and deep
pelvis. No pelvic free fluid.

Musculoskeletal: Widespread advanced disc degeneration in the spine.
Multilevel vacuum disc. Multilevel degenerative lumbar
spondylolisthesis. Associated advanced lumbar facet arthropathy. No
acute osseous abnormality identified.
IMPRESSION: 1. Left hydronephrosis with inflamed left renal pelvis and proximal
ureter at the site of a large 3.5 cm staghorn calculus. Despite the
findings, bilateral renal enhancement and contrast excretion remains
relatively symmetric.
Therefore, consider also infected left renal collecting system in
addition to obstructive uropathy. No evidence of parenchymal
pyelonephritis at this time.

2. Additional bulky left renal calculi. Distal ureters and bladder
appear within normal limits.

3. Extensive postoperative changes to the bowel including subtotal
colectomy. Dilated J pouch in the pelvis containing contrast.
Difficult to exclude stenosis at the anastomosis of the J pouch and
ileostomy loop, although absence of dilated upstream small bowel
argues against acute bowel obstruction.

## 2021-03-26 ENCOUNTER — Ambulatory Visit: Payer: Medicare Other

## 2021-03-28 ENCOUNTER — Ambulatory Visit (INDEPENDENT_AMBULATORY_CARE_PROVIDER_SITE_OTHER): Payer: Medicare Other

## 2021-03-28 VITALS — Ht 59.5 in | Wt 165.0 lb

## 2021-03-28 DIAGNOSIS — Z0000 Encounter for general adult medical examination without abnormal findings: Secondary | ICD-10-CM

## 2021-03-28 NOTE — Patient Instructions (Signed)
[name] , Thank you for taking time to come for your Medicare Wellness Visit. I appreciate your ongoing commitment to your health goals. Please review the following plan we discussed and let me know if I can assist you in the future.   Screening recommendations/referrals: Colonoscopy: not required Mammogram: not required Bone Density: completed 10/03/2013 Recommended yearly ophthalmology/optometry visit for glaucoma screening and checkup Recommended yearly dental visit for hygiene and checkup  Vaccinations: Influenza vaccine: decline Pneumococcal vaccine: completed 06/02/2010 Tdap vaccine: completed 12/17/2020, due 12/18/2030 Shingles vaccine: discussed   Covid-19:decline  Advanced directives: Please bring a copy of your POA (Power of Attorney) and/or Living Will to your next appointment.   Conditions/risks identified: none  Next appointment: Follow up in one year for your annual wellness visit    Preventive Care 65 Years and Older, Female Preventive care refers to lifestyle choices and visits with your health care provider that can promote health and wellness. What does preventive care include? A yearly physical exam. This is also called an annual well check. Dental exams once or twice a year. Routine eye exams. Ask your health care provider how often you should have your eyes checked. Personal lifestyle choices, including: Daily care of your teeth and gums. Regular physical activity. Eating a healthy diet. Avoiding tobacco and drug use. Limiting alcohol use. Practicing safe sex. Taking low-dose aspirin every day. Taking vitamin and mineral supplements as recommended by your health care provider. What happens during an annual well check? The services and screenings done by your health care provider during your annual well check will depend on your age, overall health, lifestyle risk factors, and family history of disease. Counseling  Your health care provider may ask you  questions about your: Alcohol use. Tobacco use. Drug use. Emotional well-being. Home and relationship well-being. Sexual activity. Eating habits. History of falls. Memory and ability to understand (cognition). Work and work Statistician. Reproductive health. Screening  You may have the following tests or measurements: Height, weight, and BMI. Blood pressure. Lipid and cholesterol levels. These may be checked every 5 years, or more frequently if you are over 64 years old. Skin check. Lung cancer screening. You may have this screening every year starting at age 64 if you have a 30-pack-year history of smoking and currently smoke or have quit within the past 15 years. Fecal occult blood test (FOBT) of the stool. You may have this test every year starting at age 66. Flexible sigmoidoscopy or colonoscopy. You may have a sigmoidoscopy every 5 years or a colonoscopy every 10 years starting at age 18. Hepatitis C blood test. Hepatitis B blood test. Sexually transmitted disease (STD) testing. Diabetes screening. This is done by checking your blood sugar (glucose) after you have not eaten for a while (fasting). You may have this done every 1-3 years. Bone density scan. This is done to screen for osteoporosis. You may have this done starting at age 55. Mammogram. This may be done every 1-2 years. Talk to your health care provider about how often you should have regular mammograms. Talk with your health care provider about your test results, treatment options, and if necessary, the need for more tests. Vaccines  Your health care provider may recommend certain vaccines, such as: Influenza vaccine. This is recommended every year. Tetanus, diphtheria, and acellular pertussis (Tdap, Td) vaccine. You may need a Td booster every 10 years. Zoster vaccine. You may need this after age 31. Pneumococcal 13-valent conjugate (PCV13) vaccine. One dose is recommended after age  65. Pneumococcal polysaccharide  (PPSV23) vaccine. One dose is recommended after age 62. Talk to your health care provider about which screenings and vaccines you need and how often you need them. This information is not intended to replace advice given to you by your health care provider. Make sure you discuss any questions you have with your health care provider. Document Released: 08/16/2015 Document Revised: 04/08/2016 Document Reviewed: 05/21/2015 Elsevier Interactive Patient Education  2017 [place] Prevention in the Home Falls can cause injuries. They can happen to people of all ages. There are many things you can do to make your home safe and to help prevent falls. What can I do on the outside of my home? Regularly fix the edges of walkways and driveways and fix any cracks. Remove anything that might make you trip as you walk through a door, such as a raised step or threshold. Trim any bushes or trees on the path to your home. Use bright outdoor lighting. Clear any walking paths of anything that might make someone trip, such as rocks or tools. Regularly check to see if handrails are loose or broken. Make sure that both sides of any steps have handrails. Any raised decks and porches should have guardrails on the edges. Have any leaves, snow, or ice cleared regularly. Use sand or salt on walking paths during winter. Clean up any spills in your garage right away. This includes oil or grease spills. What can I do in the bathroom? Use night lights. Install grab bars by the toilet and in the tub and shower. Do not use towel bars as grab bars. Use non-skid mats or decals in the tub or shower. If you need to sit down in the shower, use a plastic, non-slip stool. Keep the floor dry. Clean up any water that spills on the floor as soon as it happens. Remove soap buildup in the tub or shower regularly. Attach bath mats securely with double-sided non-slip rug tape. Do not have throw rugs and other things on the  floor that can make you trip. What can I do in the bedroom? Use night lights. Make sure that you have a light by your bed that is easy to reach. Do not use any sheets or blankets that are too big for your bed. They should not hang down onto the floor. Have a firm chair that has side arms. You can use this for support while you get dressed. Do not have throw rugs and other things on the floor that can make you trip. What can I do in the kitchen? Clean up any spills right away. Avoid walking on wet floors. Keep items that you use a lot in easy-to-reach places. If you need to reach something above you, use a strong step stool that has a grab bar. Keep electrical cords out of the way. Do not use floor polish or wax that makes floors slippery. If you must use wax, use non-skid floor wax. Do not have throw rugs and other things on the floor that can make you trip. What can I do with my stairs? Do not leave any items on the stairs. Make sure that there are handrails on both sides of the stairs and use them. Fix handrails that are broken or loose. Make sure that handrails are as long as the stairways. Check any carpeting to make sure that it is firmly attached to the stairs. Fix any carpet that is loose or worn. Avoid having throw rugs at  the top or bottom of the stairs. If you do have throw rugs, attach them to the floor with carpet tape. Make sure that you have a light switch at the top of the stairs and the bottom of the stairs. If you do not have them, ask someone to add them for you. What else can I do to help prevent falls? Wear shoes that: Do not have high heels. Have rubber bottoms. Are comfortable and fit you well. Are closed at the toe. Do not wear sandals. If you use a stepladder: Make sure that it is fully opened. Do not climb a closed stepladder. Make sure that both sides of the stepladder are locked into place. Ask someone to hold it for you, if possible. Clearly mark and make  sure that you can see: Any grab bars or handrails. First and last steps. Where the edge of each step is. Use tools that help you move around (mobility aids) if they are needed. These include: Canes. Walkers. Scooters. Crutches. Turn on the lights when you go into a dark area. Replace any light bulbs as soon as they burn out. Set up your furniture so you have a clear path. Avoid moving your furniture around. If any of your floors are uneven, fix them. If there are any pets around you, be aware of where they are. Review your medicines with your doctor. Some medicines can make you feel dizzy. This can increase your chance of falling. Ask your doctor what other things that you can do to help prevent falls. This information is not intended to replace advice given to you by your health care provider. Make sure you discuss any questions you have with your health care provider. Document Released: 05/16/2009 Document Revised: 12/26/2015 Document Reviewed: 08/24/2014 Elsevier Interactive Patient Education  2017 [employer].
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
## 2021-04-28 ENCOUNTER — Other Ambulatory Visit: Payer: Self-pay | Admitting: *Deleted

## 2021-04-28 DIAGNOSIS — N200 Calculus of kidney: Secondary | ICD-10-CM
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
## 2021-04-29 ENCOUNTER — Ambulatory Visit
Admission: RE | Admit: 2021-04-29 | Discharge: 2021-04-29 | Disposition: A | Discharge status: Home / Self Care | Payer: Medicare Other | Source: Ambulatory Visit | Attending: Urology | Admitting: Urology

## 2021-04-29 ENCOUNTER — Ambulatory Visit: Payer: Medicare Other | Admitting: Urology

## 2021-04-29 ENCOUNTER — Ambulatory Visit
Admission: RE | Admit: 2021-04-29 | Discharge: 2021-04-29 | Disposition: A | Discharge status: Home / Self Care | Payer: Medicare Other | Attending: Urology | Admitting: Urology

## 2021-04-29 ENCOUNTER — Other Ambulatory Visit: Payer: Self-pay

## 2021-04-29 ENCOUNTER — Encounter: Payer: Self-pay | Admitting: Urology

## 2021-04-29 VITALS — BP 168/71 | HR 81 | Ht 59.5 in | Wt 165.0 lb

## 2021-04-29 DIAGNOSIS — N200 Calculus of kidney: Secondary | ICD-10-CM | POA: Diagnosis not present

## 2021-04-29 DIAGNOSIS — N2889 Other specified disorders of kidney and ureter: Secondary | ICD-10-CM | POA: Diagnosis not present

## 2021-04-29 DIAGNOSIS — Z87442 Personal history of urinary calculi: Secondary | ICD-10-CM | POA: Diagnosis not present

## 2021-05-07 DIAGNOSIS — M1711 Unilateral primary osteoarthritis, right knee: Secondary | ICD-10-CM | POA: Diagnosis not present

## 2021-05-07 DIAGNOSIS — Z981 Arthrodesis status: Secondary | ICD-10-CM | POA: Diagnosis not present

## 2021-05-07 DIAGNOSIS — Z96652 Presence of left artificial knee joint: Secondary | ICD-10-CM | POA: Diagnosis not present

## 2021-11-17 ENCOUNTER — Encounter: Payer: Self-pay | Admitting: Family Medicine

## 2021-11-17 ENCOUNTER — Ambulatory Visit (INDEPENDENT_AMBULATORY_CARE_PROVIDER_SITE_OTHER): Payer: Medicare Other | Admitting: Family Medicine

## 2021-11-17 VITALS — BP 123/79 | HR 86 | Temp 98.2°F | Wt 169.2 lb

## 2021-11-17 DIAGNOSIS — R7303 Prediabetes: Secondary | ICD-10-CM | POA: Diagnosis not present

## 2021-11-17 DIAGNOSIS — Z932 Ileostomy status: Secondary | ICD-10-CM

## 2021-11-17 DIAGNOSIS — R42 Dizziness and giddiness: Secondary | ICD-10-CM

## 2021-11-17 DIAGNOSIS — Z13220 Encounter for screening for lipoid disorders: Secondary | ICD-10-CM | POA: Diagnosis not present

## 2021-11-17 DIAGNOSIS — N200 Calculus of kidney: Secondary | ICD-10-CM | POA: Diagnosis not present

## 2021-11-17 DIAGNOSIS — Z136 Encounter for screening for cardiovascular disorders: Secondary | ICD-10-CM | POA: Diagnosis not present

## 2021-11-17 LAB — URINALYSIS, ROUTINE W REFLEX MICROSCOPIC
Bilirubin, UA: NEGATIVE
Glucose, UA: NEGATIVE
Ketones, UA: NEGATIVE
Nitrite, UA: NEGATIVE
Protein,UA: NEGATIVE
RBC, UA: NEGATIVE
Specific Gravity, UA: >1.03 — ABNORMAL HIGH (ref 1.005–1.030)
Urobilinogen, Ur: 0.2 mg/dL (ref 0.2–1.0)
pH, UA: 5.5 (ref 5.0–7.5)

## 2021-11-17 LAB — MICROALBUMIN, URINE WAIVED
Creatinine, Urine Waived: 200 mg/dL (ref 10–300)
Microalb, Ur Waived: 10 mg/L (ref 0–19)
Microalb/Creat Ratio: <30 mg/g (ref <30)

## 2021-11-17 LAB — BAYER DCA HB A1C WAIVED: HB A1C (BAYER DCA - WAIVED): 5.4 % (ref 4.8–5.6)

## 2021-11-17 LAB — MICROSCOPIC EXAMINATION: RBC, Urine: NONE SEEN /hpf (ref 0–2)

## 2021-11-17 NOTE — Assessment & Plan Note (Signed)
Doing great with A1c of 5.4. Continue to monitor. Call with any concerns. Call with any concerns.  ?

## 2021-11-17 NOTE — Progress Notes (Signed)
? ?BP 123/79   Pulse 86   Temp 98.2 ?F (36.8 ?C)   Wt 169 lb 3.2 oz (76.7 kg)   SpO2 99%   BMI 33.60 kg/m?   ? ?Subjective:  ? ? Patient ID: Victoria Peterson, female    DOB: [birth year], 81 y.o.   MRN: [id number] ? ?HPI: ?Victoria Peterson is a 81 y.o. female ? ?Chief Complaint  ?Patient presents with  ? Dizziness  ?  Patient states she feels dizzy when she stands up in morning when she's trying to get out of bed  ? Numbness  ?  Patient states her fingers are constantly numb, numbing is worsening. Patient is having trouble picking stuff up.   ? Tingling  ?  Patient states she has a burning feeling in both legs   ? ?Impaired Fasting Glucose ?HbA1C:  ?Lab Results  ?Component Value Date  ? HGBA1C 5.4 11/17/2021  ? ?Duration of elevated blood sugar: chronic ?Polydipsia: no ?Polyuria: yes ?Weight change: no ?Visual disturbance: yes ?Glucose Monitoring: no ?Diabetic Education: Not Completed ?Family history of diabetes: yes ? ?DIZZINESS ?Duration: 2-3 weeks ?Description of symptoms: lightheaded ?Duration of episode: minutes ?Dizziness frequency:  first thing in the AM when she gets out of bed ?Triggered by rolling over in bed: yes 1x ?Triggered by bending over: yes ?Aggravated by head movement: no ?Aggravated by exertion, coughing, loud noises: no ?Recent head injury: no ?Recent or current viral symptoms: no ?History of vasovagal episodes: no ?Nausea: no ?Vomiting: no ?Tinnitus: yes ?Hearing loss: no ?Aural fullness: no ?Headache: no ?Photophobia/phonophobia: no ?Unsteady gait: yes ?Postural instability: yes ?Diplopia, dysarthria, dysphagia or weakness: no ?Related to exertion: no ?Pallor: no ?Diaphoresis: no ?Dyspnea: no ?Chest pain: no ? 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 ?Relevant past medical, surgical, family and social history reviewed and updated as indicated. Interim medical history since our last visit reviewed. ?Allergies and medications reviewed and updated. ? ?Review of Systems  ?Constitutional: Negative.   ?Respiratory: Negative.    ?Cardiovascular: Negative.   ?Gastrointestinal: Negative.   ?Musculoskeletal: Negative.   ?Neurological:  Positive for dizziness, light-headedness and numbness. Negative for tremors, seizures, syncope, facial asymmetry, speech difficulty, weakness and headaches.  ?Psychiatric/Behavioral: Negative.    ? ?Per HPI unless specifically indicated above ? ?   ?Objective:  ?  ?BP 123/79   Pulse 86   Temp 98.2 ?F (36.8 ?C)   Wt 169 lb 3.2 oz (76.7 kg)   SpO2 99%   BMI 33.60 kg/m?   ?Wt Readings from Last 3 Encounters:  ?11/17/21 169 lb 3.2 oz (76.7 kg)  ?04/29/21 165 lb (74.8 kg)  ?03/28/21 165 lb (74.8 kg)  ? Standing: 104/70, Seating: 118/78, Supine: 113/71 ?Physical Exam ?Vitals and nursing note reviewed.  ?Constitutional:   ?   General: She is not in acute distress. ?   Appearance: Normal appearance. She is not ill-appearing, toxic-appearing or diaphoretic.  ?HENT:  ?   Head: Normocephalic and atraumatic.  ?   Right Ear: External ear normal.  ?   Left Ear: External ear normal.  ?   Nose: Nose normal.  ?   Mouth/Throat:  ?  Mouth: Mucous membranes are moist.  ?   Pharynx: Oropharynx is clear.  ?Eyes:  ?   General: No scleral icterus.    ?   Right eye: No discharge.     ?   Left eye: No discharge.  ?   Extraocular Movements: Extraocular movements intact.  ?   Conjunctiva/sclera: Conjunctivae normal.  ?   Pupils: Pupils are equal, round, and reactive to light.  ?Cardiovascular:  ?   Rate and Rhythm: Normal rate and regular rhythm.  ?   Pulses: Normal pulses.  ?   Heart sounds: Normal heart sounds. No  murmur heard. ?  No friction rub. No gallop.  ?Pulmonary:  ?   Effort: Pulmonary effort is normal. No respiratory distress.  ?   Breath sounds: Normal breath sounds. No stridor. No wheezing, rhonchi or rales.  ?Chest:  ?   Chest wall: No tenderness.  ?Musculoskeletal:     ?   General: Normal range of motion.  ?   Cervical back: Normal range of motion and neck supple.  ?Skin: ?   General: Skin is warm and dry.  ?   Capillary Refill: Capillary refill takes less than 2 seconds.  ?   Coloration: Skin is not jaundiced or pale.  ?   Findings: No bruising, erythema, lesion or rash.  ?Neurological:  ?   General: No focal deficit present.  ?   Mental Status: She is alert and oriented to person, place, and time. Mental status is at baseline.  ?Psychiatric:     ?   Mood and Affect: Mood normal.     ?   Behavior: Behavior normal.     ?   Thought Content: Thought content normal.     ?   Judgment: Judgment normal.  ? ? ?Results for orders placed or performed in visit on 11/17/21  ?Microscopic Examination  ? Urine  ?Result Value Ref Range  ? WBC, UA 6-10 (A) 0 - 5 /hpf  ? RBC None seen 0 - 2 /hpf  ? Epithelial Cells (non renal) 0-10 0 - 10 /hpf  ? Crystals Present (A) N/A  ? Crystal Type Calcium Oxalate N/A  ? Mucus, UA Present (A) Not Estab.  ? Bacteria, UA Many (A) None seen/Few  ?Urinalysis, Routine w reflex microscopic  ?Result Value Ref Range  ? Specific Gravity, UA >1.030 (H) 1.005 - 1.030  ? pH, UA 5.5 5.0 - 7.5  ? Color, UA Yellow Yellow  ? Appearance Ur Cloudy (A) Clear  ? Leukocytes,UA 1+ (A) Negative  ? Protein,UA Negative Negative/Trace  ? Glucose, UA Negative Negative  ? Ketones, UA Negative Negative  ? RBC, UA Negative Negative  ? Bilirubin, UA Negative Negative  ? Urobilinogen, Ur 0.2 0.2 - 1.0 mg/dL  ? Nitrite, UA Negative Negative  ? Microscopic Examination See below:   ?Bayer DCA Hb A1c Waived  ?Result Value Ref Range  ? HB A1C (BAYER DCA - WAIVED) 5.4 4.8 - 5.6 %  ?Microalbumin, Urine Waived  ?Result Value Ref  Range  ? Microalb, Ur Waived 10 0 - 19 mg/L  ? Creatinine, Urine Waived 200 10 - 300 mg/dL  ? Microalb/Creat Ratio <30 <30 mg/g  ? ?   ?Assessment & Plan:  ? ?Problem List Items Addressed This Visit   ? ?  ? Genitourinary  ? Left nephrolithiasis  ?  Rechecking urine today. No pain. Continue to monitor. Call with any concerns.  ? ?  ?  ? Relevant Orders  ?  CBC with Differential/Platelet  ? Comprehensive metabolic panel  ? Urinalysis, Routine w reflex microscopic (Completed)  ?  ? Other  ? 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  Stable. Continue to monitor. Call with any concerns. Continue to monitor.  ? ?  ?  ? Relevant Orders  ? CBC with Differential/Platelet  ? Comprehensive metabolic panel  ? Prediabetes  ?  Doing great with A1c of 5.4. Continue to monitor. Call with any concerns. Call with any concerns.  ? ?  ?  ? Relevant Orders  ? CBC with Differential/Platelet  ? Comprehensive metabolic panel  ? Bayer DCA Hb A1c Waived (Completed)  ? Microalbumin, Urine Waived (Completed)  ? ?Other Visit Diagnoses   ? ? Dizziness    -  Primary  ? Not orthostatic. Will check labs. Concern for B12 deficiency. Await results. Treat as needed.   ? Relevant Orders  ? TSH  ? B12  ? Screening for cholesterol level      ? Labs drawn today. Await results. Treat as needed.   ? Relevant Orders  ? Lipid Panel w/o Chol/HDL Ratio  ? ?  ?  ? ?Follow up plan: ?Return in about 31 days (around 12/18/2021) for physical. ? ? ? ? ? ?

## 2021-11-17 NOTE — Assessment & Plan Note (Signed)
Stable. Continue to monitor. Call with any concerns. Continue to monitor.  ?

## 2021-11-17 NOTE — Assessment & Plan Note (Signed)
Rechecking urine today. No pain. Continue to monitor. Call with any concerns.  ?

## 2021-11-18 LAB — LIPID PANEL W/O CHOL/HDL RATIO
Cholesterol, Total: 245 mg/dL — ABNORMAL HIGH (ref 100–199)
HDL: 84 mg/dL (ref >39)
LDL Chol Calc (NIH): 137 mg/dL — ABNORMAL HIGH (ref 0–99)
Triglycerides: 140 mg/dL (ref 0–149)
VLDL Cholesterol Cal: 24 mg/dL (ref 5–40)

## 2021-11-18 LAB — COMPREHENSIVE METABOLIC PANEL
ALT: 20 IU/L (ref 0–32)
AST: 26 IU/L (ref 0–40)
Albumin/Globulin Ratio: 1.6 (ref 1.2–2.2)
Albumin: 4.3 g/dL (ref 3.7–4.7)
Alkaline Phosphatase: 80 IU/L (ref 44–121)
BUN/Creatinine Ratio: 16 (ref 12–28)
BUN: 14 mg/dL (ref 8–27)
Bilirubin Total: 0.7 mg/dL (ref 0.0–1.2)
CO2: 22 mmol/L (ref 20–29)
Calcium: 9.6 mg/dL (ref 8.7–10.3)
Chloride: 107 mmol/L — ABNORMAL HIGH (ref 96–106)
Creatinine, Ser: 0.9 mg/dL (ref 0.57–1.00)
Globulin, Total: 2.7 g/dL (ref 1.5–4.5)
Glucose: 99 mg/dL (ref 70–99)
Potassium: 4.5 mmol/L (ref 3.5–5.2)
Sodium: 143 mmol/L (ref 134–144)
Total Protein: 7 g/dL (ref 6.0–8.5)
eGFR: 65 mL/min/{1.73_m2} (ref >59)

## 2021-11-18 LAB — CBC WITH DIFFERENTIAL/PLATELET
Basophils Absolute: 0 10*3/uL (ref 0.0–0.2)
Basos: 1 %
EOS (ABSOLUTE): 0.1 10*3/uL (ref 0.0–0.4)
Eos: 1 %
Hematocrit: 39.6 % (ref 34.0–46.6)
Hemoglobin: 12.8 g/dL (ref 11.1–15.9)
Immature Grans (Abs): 0 10*3/uL (ref 0.0–0.1)
Immature Granulocytes: 0 %
Lymphocytes Absolute: 1.7 10*3/uL (ref 0.7–3.1)
Lymphs: 34 %
MCH: 26.1 pg — ABNORMAL LOW (ref 26.6–33.0)
MCHC: 32.3 g/dL (ref 31.5–35.7)
MCV: 81 fL (ref 79–97)
Monocytes Absolute: 0.4 10*3/uL (ref 0.1–0.9)
Monocytes: 7 %
Neutrophils Absolute: 2.9 10*3/uL (ref 1.4–7.0)
Neutrophils: 57 %
Platelets: 250 10*3/uL (ref 150–450)
RBC: 4.9 x10E6/uL (ref 3.77–5.28)
RDW: 14.4 % (ref 11.7–15.4)
WBC: 5.1 10*3/uL (ref 3.4–10.8)

## 2021-11-18 LAB — VITAMIN B12: Vitamin B-12: 1845 pg/mL — ABNORMAL HIGH (ref 232–1245)

## 2021-11-18 LAB — TSH: TSH: 0.843 u[IU]/mL (ref 0.450–4.500)

## 2021-11-19 ENCOUNTER — Telehealth: Payer: Self-pay | Admitting: Family Medicine

## 2021-11-19 NOTE — Telephone Encounter (Signed)
Left a message for patient to give our office a call back in regards to her recent lab results. Per Dr.Johnson note "Everything looks nice and normal. Your B12 is right where it should be. Let's see what neurology says when you see them in May and we'll determine where to go from there. Have a great day!" ? ?OK for PEC/Nurse Triage to result note if patient calls back.  ?

## 2021-11-19 NOTE — Telephone Encounter (Signed)
Copied from [place] [phone]. Topic: General - Other ?>> Nov 19, 2021 12:24 PM Pawlus, Brayton Layman A wrote: ?Reason for CRM: Pt called in wanting to go over her latest lab results, pt stated she does not currently have access to her MyChart, please advise. ?

## 2021-11-20 NOTE — Telephone Encounter (Signed)
Called and discussed lab results with the patient.  ?

## 2021-12-05 DIAGNOSIS — R208 Other disturbances of skin sensation: Secondary | ICD-10-CM | POA: Diagnosis not present

## 2021-12-05 DIAGNOSIS — G959 Disease of spinal cord, unspecified: Secondary | ICD-10-CM | POA: Diagnosis not present

## 2021-12-05 DIAGNOSIS — G5603 Carpal tunnel syndrome, bilateral upper limbs: Secondary | ICD-10-CM | POA: Diagnosis not present

## 2021-12-05 DIAGNOSIS — G2581 Restless legs syndrome: Secondary | ICD-10-CM | POA: Diagnosis not present

## 2021-12-05 DIAGNOSIS — Z981 Arthrodesis status: Secondary | ICD-10-CM | POA: Diagnosis not present

## 2021-12-10 ENCOUNTER — Other Ambulatory Visit: Payer: Self-pay | Admitting: Physical Medicine & Rehabilitation

## 2021-12-10 DIAGNOSIS — M5442 Lumbago with sciatica, left side: Secondary | ICD-10-CM | POA: Diagnosis not present

## 2021-12-10 DIAGNOSIS — G8929 Other chronic pain: Secondary | ICD-10-CM | POA: Diagnosis not present

## 2021-12-15 DIAGNOSIS — M170 Bilateral primary osteoarthritis of knee: Secondary | ICD-10-CM | POA: Diagnosis not present

## 2021-12-18 ENCOUNTER — Ambulatory Visit (INDEPENDENT_AMBULATORY_CARE_PROVIDER_SITE_OTHER): Payer: Medicare Other | Admitting: Family Medicine

## 2021-12-18 ENCOUNTER — Encounter: Payer: Self-pay | Admitting: Family Medicine

## 2021-12-18 VITALS — BP 121/78 | HR 67 | Temp 98.0°F | Ht 60.0 in | Wt 172.4 lb

## 2021-12-18 DIAGNOSIS — Z0000 Encounter for general adult medical examination without abnormal findings: Secondary | ICD-10-CM
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
## 2021-12-22 ENCOUNTER — Ambulatory Visit
Admission: RE | Admit: 2021-12-22 | Discharge: 2021-12-22 | Disposition: A | Discharge status: Home / Self Care | Payer: Medicare Other | Source: Ambulatory Visit | Attending: Physical Medicine & Rehabilitation | Admitting: Physical Medicine & Rehabilitation

## 2021-12-22 DIAGNOSIS — M5442 Lumbago with sciatica, left side: Secondary | ICD-10-CM | POA: Diagnosis not present

## 2021-12-22 DIAGNOSIS — M47817 Spondylosis without myelopathy or radiculopathy, lumbosacral region: Secondary | ICD-10-CM | POA: Diagnosis not present

## 2021-12-22 DIAGNOSIS — M48061 Spinal stenosis, lumbar region without neurogenic claudication: Secondary | ICD-10-CM | POA: Diagnosis not present

## 2021-12-22 DIAGNOSIS — M5126 Other intervertebral disc displacement, lumbar region: Secondary | ICD-10-CM | POA: Diagnosis not present

## 2021-12-22 DIAGNOSIS — G8929 Other chronic pain: Secondary | ICD-10-CM | POA: Insufficient documentation

## 2021-12-25 DIAGNOSIS — G5603 Carpal tunnel syndrome, bilateral upper limbs: Secondary | ICD-10-CM | POA: Diagnosis not present

## 2021-12-30 DIAGNOSIS — M48062 Spinal stenosis, lumbar region with neurogenic claudication: Secondary | ICD-10-CM | POA: Diagnosis not present

## 2021-12-30 DIAGNOSIS — M5442 Lumbago with sciatica, left side: Secondary | ICD-10-CM | POA: Diagnosis not present

## 2021-12-30 DIAGNOSIS — G8929 Other chronic pain: Secondary | ICD-10-CM | POA: Diagnosis not present

## 2022-01-12 DIAGNOSIS — M48062 Spinal stenosis, lumbar region with neurogenic claudication: Secondary | ICD-10-CM | POA: Diagnosis not present

## 2022-01-27 DIAGNOSIS — M48062 Spinal stenosis, lumbar region with neurogenic claudication: Secondary | ICD-10-CM | POA: Diagnosis not present

## 2022-01-27 DIAGNOSIS — G8929 Other chronic pain: Secondary | ICD-10-CM | POA: Diagnosis not present

## 2022-01-27 DIAGNOSIS — M5442 Lumbago with sciatica, left side: Secondary | ICD-10-CM | POA: Diagnosis not present

## 2022-03-30 ENCOUNTER — Ambulatory Visit (INDEPENDENT_AMBULATORY_CARE_PROVIDER_SITE_OTHER): Payer: Medicare Other | Admitting: *Deleted

## 2022-03-30 DIAGNOSIS — Z0000 Encounter for general adult medical examination without abnormal findings: Secondary | ICD-10-CM | POA: Diagnosis not present
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
## 2022-03-30 NOTE — Patient Instructions (Signed)
[name] , Thank you for taking time to come for your Medicare Wellness Visit. I appreciate your ongoing commitment to your health goals. Please review the following plan we discussed and let me know if I can assist you in the future.   Screening recommendations/referrals: Colonoscopy: no longer required Mammogram: no longer required Bone Density: Education provided Recommended yearly ophthalmology/optometry visit for glaucoma screening and checkup Recommended yearly dental visit for hygiene and checkup  Vaccinations: Influenza vaccine: Education provided Pneumococcal vaccine: up to date Tdap vaccine: up to date Shingles vaccine: Education provided    Advanced directives: Education provided  Conditions/risks identified:   Next appointment: 12-20-2021 @ 10:00 [facility] 65 Years and Older, Female Preventive care refers to lifestyle choices and visits with your health care provider that can promote health and wellness. What does preventive care include? A yearly physical exam. This is also called an annual well check. Dental exams once or twice a year. Routine eye exams. Ask your health care provider how often you should have your eyes checked. Personal lifestyle choices, including: Daily care of your teeth and gums. Regular physical activity. Eating a healthy diet. Avoiding tobacco and drug use. Limiting alcohol use. Practicing safe sex. Taking low-dose aspirin every day. Taking vitamin and mineral supplements as recommended by your health care provider. What happens during an annual well check? The services and screenings done by your health care provider during your annual well check will depend on your age, overall health, lifestyle risk factors, and family history of disease. Counseling  Your health care provider may ask you questions about your: Alcohol use. Tobacco use. Drug use. Emotional well-being. Home and relationship well-being. Sexual  activity. Eating habits. History of falls. Memory and ability to understand (cognition). Work and work Statistician. Reproductive health. Screening  You may have the following tests or measurements: Height, weight, and BMI. Blood pressure. Lipid and cholesterol levels. These may be checked every 5 years, or more frequently if you are over 71 years old. Skin check. Lung cancer screening. You may have this screening every year starting at age 79 if you have a 30-pack-year history of smoking and currently smoke or have quit within the past 15 years. Fecal occult blood test (FOBT) of the stool. You may have this test every year starting at age 79. Flexible sigmoidoscopy or colonoscopy. You may have a sigmoidoscopy every 5 years or a colonoscopy every 10 years starting at age 79. Hepatitis C blood test. Hepatitis B blood test. Sexually transmitted disease (STD) testing. Diabetes screening. This is done by checking your blood sugar (glucose) after you have not eaten for a while (fasting). You may have this done every 1-3 years. Bone density scan. This is done to screen for osteoporosis. You may have this done starting at age 79. Mammogram. This may be done every 1-2 years. Talk to your health care provider about how often you should have regular mammograms. Talk with your health care provider about your test results, treatment options, and if necessary, the need for more tests. Vaccines  Your health care provider may recommend certain vaccines, such as: Influenza vaccine. This is recommended every year. Tetanus, diphtheria, and acellular pertussis (Tdap, Td) vaccine. You may need a Td booster every 10 years. Zoster vaccine. You may need this after age 79. Pneumococcal 13-valent conjugate (PCV13) vaccine. One dose is recommended after age 79. Pneumococcal polysaccharide (PPSV23) vaccine. One dose is recommended after age 79. Talk to your health care provider about which  screenings and vaccines  you need and how often you need them. This information is not intended to replace advice given to you by your health care provider. Make sure you discuss any questions you have with your health care provider. Document Released: 08/16/2015 Document Revised: 04/08/2016 Document Reviewed: 05/21/2015 Elsevier Interactive Patient Education  2017 [place] Prevention in the Home Falls can cause injuries. They can happen to people of all ages. There are many things you can do to make your home safe and to help prevent falls. What can I do on the outside of my home? Regularly fix the edges of walkways and driveways and fix any cracks. Remove anything that might make you trip as you walk through a door, such as a raised step or threshold. Trim any bushes or trees on the path to your home. Use bright outdoor lighting. Clear any walking paths of anything that might make someone trip, such as rocks or tools. Regularly check to see if handrails are loose or broken. Make sure that both sides of any steps have handrails. Any raised decks and porches should have guardrails on the edges. Have any leaves, snow, or ice cleared regularly. Use sand or salt on walking paths during winter. Clean up any spills in your garage right away. This includes oil or grease spills. What can I do in the bathroom? Use night lights. Install grab bars by the toilet and in the tub and shower. Do not use towel bars as grab bars. Use non-skid mats or decals in the tub or shower. If you need to sit down in the shower, use a plastic, non-slip stool. Keep the floor dry. Clean up any water that spills on the floor as soon as it happens. Remove soap buildup in the tub or shower regularly. Attach bath mats securely with double-sided non-slip rug tape. Do not have throw rugs and other things on the floor that can make you trip. What can I do in the bedroom? Use night lights. Make sure that you have a light by your bed that  is easy to reach. Do not use any sheets or blankets that are too big for your bed. They should not hang down onto the floor. Have a firm chair that has side arms. You can use this for support while you get dressed. Do not have throw rugs and other things on the floor that can make you trip. What can I do in the kitchen? Clean up any spills right away. Avoid walking on wet floors. Keep items that you use a lot in easy-to-reach places. If you need to reach something above you, use a strong step stool that has a grab bar. Keep electrical cords out of the way. Do not use floor polish or wax that makes floors slippery. If you must use wax, use non-skid floor wax. Do not have throw rugs and other things on the floor that can make you trip. What can I do with my stairs? Do not leave any items on the stairs. Make sure that there are handrails on both sides of the stairs and use them. Fix handrails that are broken or loose. Make sure that handrails are as long as the stairways. Check any carpeting to make sure that it is firmly attached to the stairs. Fix any carpet that is loose or worn. Avoid having throw rugs at the top or bottom of the stairs. If you do have throw rugs, attach them to the floor with carpet  tape. Make sure that you have a light switch at the top of the stairs and the bottom of the stairs. If you do not have them, ask someone to add them for you. What else can I do to help prevent falls? Wear shoes that: Do not have high heels. Have rubber bottoms. Are comfortable and fit you well. Are closed at the toe. Do not wear sandals. If you use a stepladder: Make sure that it is fully opened. Do not climb a closed stepladder. Make sure that both sides of the stepladder are locked into place. Ask someone to hold it for you, if possible. Clearly mark and make sure that you can see: Any grab bars or handrails. First and last steps. Where the edge of each step is. Use tools that help you  move around (mobility aids) if they are needed. These include: Canes. Walkers. Scooters. Crutches. Turn on the lights when you go into a dark area. Replace any light bulbs as soon as they burn out. Set up your furniture so you have a clear path. Avoid moving your furniture around. If any of your floors are uneven, fix them. If there are any pets around you, be aware of where they are. Review your medicines with your doctor. Some medicines can make you feel dizzy. This can increase your chance of falling. Ask your doctor what other things that you can do to help prevent falls. This information is not intended to replace advice given to you by your health care provider. Make sure you discuss any questions you have with your health care provider. Document Released: 05/16/2009 Document Revised: 12/26/2015 Document Reviewed: 08/24/2014 Elsevier Interactive Patient Education  2017 [employer].

## 2022-04-22 ENCOUNTER — Telehealth: Payer: Self-pay | Admitting: *Deleted

## 2022-04-22 NOTE — Patient Outreach (Signed)
  Care Coordination   Initial Visit Note   04/22/2022 Name: Victoria Peterson MRN: [id number] DOB: 06-27-1941  Victoria Peterson is a 81 y.o. year old female who sees Valerie Roys, DO for primary care. I spoke with  [name] by phone today.  What matters to the patients health and wellness today?  My health is good right now. Patient has done her AWV.  RN discussed services [facility] services, RN, SW, and Pharmacist. Patient declined services.     Goals Addressed             This Visit's Progress    Patient advised to schedule vaccines          SDOH assessments and interventions completed:  Yes     Care Coordination Interventions Activated:  Yes  Care Coordination Interventions:  Yes, provided . Patient advised to follow up on vaccine. RN discussed with patient about checking for medical alert system  Follow up plan: No further intervention required.   Encounter Outcome:  Pt. [place] [facility] Management [phone]

## 2022-05-04 ENCOUNTER — Other Ambulatory Visit: Payer: Self-pay

## 2022-05-04 DIAGNOSIS — N200 Calculus of kidney: Secondary | ICD-10-CM

## 2022-05-05 ENCOUNTER — Ambulatory Visit: Payer: Medicare Other | Admitting: Urology

## 2022-05-05 ENCOUNTER — Encounter: Payer: Self-pay | Admitting: Urology

## 2022-05-05 ENCOUNTER — Ambulatory Visit
Admission: RE | Admit: 2022-05-05 | Discharge: 2022-05-05 | Disposition: A | Discharge status: Home / Self Care | Payer: Medicare Other | Attending: Urology | Admitting: Urology

## 2022-05-05 ENCOUNTER — Ambulatory Visit
Admission: RE | Admit: 2022-05-05 | Discharge: 2022-05-05 | Disposition: A | Discharge status: Home / Self Care | Payer: Medicare Other | Source: Ambulatory Visit | Attending: Urology | Admitting: Urology

## 2022-05-05 VITALS — BP 95/63 | HR 69 | Ht 60.0 in | Wt 170.0 lb

## 2022-05-05 DIAGNOSIS — N200 Calculus of kidney: Secondary | ICD-10-CM

## 2022-05-05 DIAGNOSIS — R3915 Urgency of urination: Secondary | ICD-10-CM
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
## 2022-06-30 DIAGNOSIS — M5441 Lumbago with sciatica, right side: Secondary | ICD-10-CM | POA: Diagnosis not present

## 2022-06-30 DIAGNOSIS — M48062 Spinal stenosis, lumbar region with neurogenic claudication: Secondary | ICD-10-CM | POA: Diagnosis not present

## 2022-06-30 DIAGNOSIS — G8929 Other chronic pain: Secondary | ICD-10-CM | POA: Diagnosis not present

## 2022-06-30 DIAGNOSIS — M5442 Lumbago with sciatica, left side: Secondary | ICD-10-CM | POA: Diagnosis not present

## 2022-07-17 DIAGNOSIS — M48062 Spinal stenosis, lumbar region with neurogenic claudication: Secondary | ICD-10-CM | POA: Diagnosis not present

## 2022-08-04 DIAGNOSIS — M48062 Spinal stenosis, lumbar region with neurogenic claudication: Secondary | ICD-10-CM | POA: Diagnosis not present

## 2022-08-04 DIAGNOSIS — G8929 Other chronic pain: Secondary | ICD-10-CM | POA: Diagnosis not present

## 2022-08-04 DIAGNOSIS — M5441 Lumbago with sciatica, right side: Secondary | ICD-10-CM | POA: Diagnosis not present

## 2022-08-04 DIAGNOSIS — M5442 Lumbago with sciatica, left side: Secondary | ICD-10-CM | POA: Diagnosis not present

## 2022-08-25 DIAGNOSIS — M5442 Lumbago with sciatica, left side: Secondary | ICD-10-CM | POA: Diagnosis not present

## 2022-08-25 DIAGNOSIS — G8929 Other chronic pain: Secondary | ICD-10-CM | POA: Diagnosis not present

## 2022-08-25 DIAGNOSIS — M5441 Lumbago with sciatica, right side: Secondary | ICD-10-CM | POA: Diagnosis not present

## 2022-08-25 DIAGNOSIS — M48062 Spinal stenosis, lumbar region with neurogenic claudication: Secondary | ICD-10-CM | POA: Diagnosis not present

## 2022-09-02 DIAGNOSIS — M170 Bilateral primary osteoarthritis of knee: Secondary | ICD-10-CM | POA: Diagnosis not present

## 2022-11-17 DIAGNOSIS — M5442 Lumbago with sciatica, left side: Secondary | ICD-10-CM | POA: Diagnosis not present

## 2022-11-17 DIAGNOSIS — M48062 Spinal stenosis, lumbar region with neurogenic claudication: Secondary | ICD-10-CM | POA: Diagnosis not present

## 2022-11-17 DIAGNOSIS — M5441 Lumbago with sciatica, right side: Secondary | ICD-10-CM | POA: Diagnosis not present

## 2022-11-17 DIAGNOSIS — G8929 Other chronic pain: Secondary | ICD-10-CM | POA: Diagnosis not present

## 2022-12-03 DIAGNOSIS — M5416 Radiculopathy, lumbar region: Secondary | ICD-10-CM | POA: Diagnosis not present

## 2022-12-04 DIAGNOSIS — M1711 Unilateral primary osteoarthritis, right knee: Secondary | ICD-10-CM | POA: Diagnosis not present

## 2022-12-16 DIAGNOSIS — G8929 Other chronic pain: Secondary | ICD-10-CM | POA: Diagnosis not present

## 2022-12-16 DIAGNOSIS — M5416 Radiculopathy, lumbar region: Secondary | ICD-10-CM | POA: Diagnosis not present

## 2022-12-16 DIAGNOSIS — M5442 Lumbago with sciatica, left side: Secondary | ICD-10-CM | POA: Diagnosis not present

## 2022-12-16 DIAGNOSIS — M5441 Lumbago with sciatica, right side: Secondary | ICD-10-CM | POA: Diagnosis not present

## 2022-12-21 ENCOUNTER — Ambulatory Visit (INDEPENDENT_AMBULATORY_CARE_PROVIDER_SITE_OTHER): Payer: Medicare Other | Admitting: Family Medicine

## 2022-12-21 ENCOUNTER — Encounter: Payer: Self-pay | Admitting: Family Medicine

## 2022-12-21 VITALS — BP 106/72 | HR 67 | Temp 98.3°F | Ht 60.0 in | Wt 175.2 lb

## 2022-12-21 DIAGNOSIS — Z136 Encounter for screening for cardiovascular disorders: Secondary | ICD-10-CM | POA: Diagnosis not present

## 2022-12-21 DIAGNOSIS — G959 Disease of spinal cord, unspecified: Secondary | ICD-10-CM

## 2022-12-21 DIAGNOSIS — R8281 Pyuria: Secondary | ICD-10-CM | POA: Diagnosis not present

## 2022-12-21 DIAGNOSIS — Z0000 Encounter for general adult medical examination without abnormal findings: Secondary | ICD-10-CM

## 2022-12-21 DIAGNOSIS — E782 Mixed hyperlipidemia: Secondary | ICD-10-CM

## 2022-12-21 DIAGNOSIS — R7303 Prediabetes: Secondary | ICD-10-CM

## 2022-12-21 DIAGNOSIS — Z13820 Encounter for screening for osteoporosis: Secondary | ICD-10-CM | POA: Diagnosis not present

## 2022-12-21 LAB — MICROSCOPIC EXAMINATION

## 2022-12-21 LAB — URINALYSIS, ROUTINE W REFLEX MICROSCOPIC
Bilirubin, UA: NEGATIVE
Glucose, UA: NEGATIVE
Ketones, UA: NEGATIVE
Nitrite, UA: NEGATIVE
Protein,UA: NEGATIVE
Specific Gravity, UA: 1.01 (ref 1.005–1.030)
Urobilinogen, Ur: 0.2 mg/dL (ref 0.2–1.0)
pH, UA: 5 (ref 5.0–7.5)

## 2022-12-21 LAB — BAYER DCA HB A1C WAIVED: HB A1C (BAYER DCA - WAIVED): 5.9 % — ABNORMAL HIGH (ref 4.8–5.6)

## 2022-12-21 NOTE — Assessment & Plan Note (Signed)
Continue to follow with pain management. Continue to monitor.  

## 2022-12-21 NOTE — Assessment & Plan Note (Signed)
Rechecking labs today. Await results. Treat as needed.  °

## 2022-12-21 NOTE — Assessment & Plan Note (Signed)
A1c at 5.9. Continue diet and exercise. Recheck 6 months.

## 2022-12-21 NOTE — Patient Instructions (Addendum)
Your bone density exam is scheduled for 02/09/23 at 2:20 pm. This is scheduled at [facility] in [place].   Address: [address], [place], [place] [address] Phone: [phone]
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
## 2022-12-22 DIAGNOSIS — R8281 Pyuria: Secondary | ICD-10-CM | POA: Diagnosis not present

## 2022-12-22 LAB — CBC WITH DIFFERENTIAL/PLATELET
Basophils Absolute: 0.1 10*3/uL (ref 0.0–0.2)
Basos: 1 %
EOS (ABSOLUTE): 0.1 10*3/uL (ref 0.0–0.4)
Eos: 3 %
Hematocrit: 39.5 % (ref 34.0–46.6)
Hemoglobin: 12.7 g/dL (ref 11.1–15.9)
Immature Grans (Abs): 0 10*3/uL (ref 0.0–0.1)
Immature Granulocytes: 0 %
Lymphocytes Absolute: 1.5 10*3/uL (ref 0.7–3.1)
Lymphs: 36 %
MCH: 27.6 pg (ref 26.6–33.0)
MCHC: 32.2 g/dL (ref 31.5–35.7)
MCV: 86 fL (ref 79–97)
Monocytes Absolute: 0.4 10*3/uL (ref 0.1–0.9)
Monocytes: 10 %
Neutrophils Absolute: 2 10*3/uL (ref 1.4–7.0)
Neutrophils: 50 %
Platelets: 241 10*3/uL (ref 150–450)
RBC: 4.6 x10E6/uL (ref 3.77–5.28)
RDW: 13.8 % (ref 11.7–15.4)
WBC: 4 10*3/uL (ref 3.4–10.8)

## 2022-12-22 LAB — COMPREHENSIVE METABOLIC PANEL
ALT: 15 IU/L (ref 0–32)
AST: 26 IU/L (ref 0–40)
Albumin/Globulin Ratio: 1.8 (ref 1.2–2.2)
Albumin: 4.1 g/dL (ref 3.8–4.8)
Alkaline Phosphatase: 56 IU/L (ref 44–121)
BUN/Creatinine Ratio: 11 — ABNORMAL LOW (ref 12–28)
BUN: 11 mg/dL (ref 8–27)
Bilirubin Total: 0.6 mg/dL (ref 0.0–1.2)
CO2: 22 mmol/L (ref 20–29)
Calcium: 9.2 mg/dL (ref 8.7–10.3)
Chloride: 104 mmol/L (ref 96–106)
Creatinine, Ser: 1 mg/dL (ref 0.57–1.00)
Globulin, Total: 2.3 g/dL (ref 1.5–4.5)
Glucose: 100 mg/dL — ABNORMAL HIGH (ref 70–99)
Potassium: 4.4 mmol/L (ref 3.5–5.2)
Sodium: 141 mmol/L (ref 134–144)
Total Protein: 6.4 g/dL (ref 6.0–8.5)
eGFR: 57 mL/min/{1.73_m2} — ABNORMAL LOW (ref >59)

## 2022-12-22 LAB — LIPID PANEL W/O CHOL/HDL RATIO
Cholesterol, Total: 236 mg/dL — ABNORMAL HIGH (ref 100–199)
HDL: 75 mg/dL (ref >39)
LDL Chol Calc (NIH): 143 mg/dL — ABNORMAL HIGH (ref 0–99)
Triglycerides: 103 mg/dL (ref 0–149)
VLDL Cholesterol Cal: 18 mg/dL (ref 5–40)

## 2022-12-22 LAB — TSH: TSH: 1.24 u[IU]/mL (ref 0.450–4.500)

## 2022-12-26 LAB — URINE CULTURE

## 2022-12-27 ENCOUNTER — Other Ambulatory Visit: Payer: Self-pay | Admitting: Family Medicine

## 2022-12-27 MED ORDER — CIPROFLOXACIN HCL 500 MG PO TABS: 500.0000 mg | ORAL_TABLET | Freq: Two times a day (BID) | ORAL | 0 refills | Status: DC

## 2023-01-13 DIAGNOSIS — M1711 Unilateral primary osteoarthritis, right knee: Secondary | ICD-10-CM | POA: Diagnosis not present

## 2023-02-09 ENCOUNTER — Encounter: Payer: Self-pay | Admitting: Nurse Practitioner

## 2023-02-09 ENCOUNTER — Ambulatory Visit
Admission: RE | Admit: 2023-02-09 | Discharge: 2023-02-09 | Disposition: A | Discharge status: Home / Self Care | Payer: Medicare Other | Source: Ambulatory Visit | Attending: Family Medicine | Admitting: Family Medicine

## 2023-02-09 DIAGNOSIS — M8589 Other specified disorders of bone density and structure, multiple sites: Secondary | ICD-10-CM | POA: Insufficient documentation

## 2023-02-09 DIAGNOSIS — Z13820 Encounter for screening for osteoporosis: Secondary | ICD-10-CM | POA: Insufficient documentation

## 2023-02-09 DIAGNOSIS — Z780 Asymptomatic menopausal state: Secondary | ICD-10-CM | POA: Diagnosis not present

## 2023-02-09 DIAGNOSIS — M85851 Other specified disorders of bone density and structure, right thigh: Secondary | ICD-10-CM | POA: Insufficient documentation

## 2023-02-09 NOTE — Progress Notes (Signed)
Contacted via MyChart   Your bone density shows thinning bones (osteopenia) but not brittle (osteoporosis). We recommend Vitamin D supplementation of about 2,0000 IUs of over the counter Vitamin D3. In addition, we recommend a diet high in calcium with dairy and dark green leafy vegetables. We would like you to get plenty of weight bearing exercises with walking and resistance training such as light weights or resistance bands available with instructions at places such as [employer].

## 2023-04-15 ENCOUNTER — Telehealth: Payer: Self-pay | Admitting: Family Medicine

## 2023-04-15 NOTE — Telephone Encounter (Signed)
Copied from CRM [id number]. Topic: Medicare AWV >> Apr 15, 2023  3:19 PM Payton Doughty wrote: Reason for CRM: LM 04/15/2023 to schedule AWV   Verlee Rossetti; Care Guide Ambulatory Clinical Support Louise l [facility] Direct Dial: [phone]

## 2023-05-06 ENCOUNTER — Other Ambulatory Visit: Payer: Self-pay

## 2023-05-06 DIAGNOSIS — N200 Calculus of kidney: Secondary | ICD-10-CM

## 2023-05-11 ENCOUNTER — Ambulatory Visit
Admission: RE | Admit: 2023-05-11 | Discharge: 2023-05-11 | Disposition: A | Discharge status: Home / Self Care | Payer: Medicare Other | Source: Ambulatory Visit | Attending: Urology | Admitting: Urology

## 2023-05-11 ENCOUNTER — Ambulatory Visit: Payer: Medicare Other | Admitting: Urology

## 2023-05-11 ENCOUNTER — Ambulatory Visit
Admission: RE | Admit: 2023-05-11 | Discharge: 2023-05-11 | Disposition: A | Discharge status: Home / Self Care | Payer: Medicare Other | Attending: Urology | Admitting: Urology

## 2023-05-11 VITALS — BP 132/80 | HR 90 | Ht 60.0 in | Wt 166.0 lb

## 2023-05-11 DIAGNOSIS — Z87442 Personal history of urinary calculi: Secondary | ICD-10-CM | POA: Diagnosis not present

## 2023-05-11 DIAGNOSIS — N2889 Other specified disorders of kidney and ureter: Secondary | ICD-10-CM | POA: Diagnosis not present

## 2023-05-11 DIAGNOSIS — N200 Calculus of kidney: Secondary | ICD-10-CM | POA: Insufficient documentation
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
## 2023-06-23 ENCOUNTER — Encounter: Payer: Self-pay | Admitting: Family Medicine

## 2023-06-23 ENCOUNTER — Ambulatory Visit (INDEPENDENT_AMBULATORY_CARE_PROVIDER_SITE_OTHER): Payer: Medicare Other | Admitting: Family Medicine

## 2023-06-23 VITALS — BP 135/67 | HR 69 | Temp 98.0°F | Resp 16 | Wt 176.8 lb

## 2023-06-23 DIAGNOSIS — Z0000 Encounter for general adult medical examination without abnormal findings: Secondary | ICD-10-CM | POA: Diagnosis not present

## 2023-06-23 DIAGNOSIS — E782 Mixed hyperlipidemia: Secondary | ICD-10-CM | POA: Diagnosis not present

## 2023-06-23 DIAGNOSIS — R7303 Prediabetes: Secondary | ICD-10-CM

## 2023-06-23 LAB — BAYER DCA HB A1C WAIVED: HB A1C (BAYER DCA - WAIVED): 5.5 % (ref 4.8–5.6)

## 2023-06-23 NOTE — Assessment & Plan Note (Signed)
Doing great with A1c of 5.5. Continue diet and exercise. Call with any concerns.
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
## 2023-06-23 NOTE — Assessment & Plan Note (Signed)
Rechecking labs today. Await results. Treat as needed.  °

## 2023-06-23 NOTE — Patient Instructions (Signed)
Preventative Services:  Health Risk Assessment and Personalized Prevention Plan: Done today Bone Mass Measurements: up to date Breast Cancer Screening: N/A CVD Screening: Done today Cervical Cancer Screening: N/A Colon Cancer Screening: N/A Depression Screening: Done today Diabetes Screening: Done today Glaucoma Screening: see your eye doctor Hepatitis B vaccine: N/A Hepatitis C screening: up to date HIV Screening: up to date Flu Vaccine: declined Lung cancer Screening: N/A Obesity Screening: done today Pneumonia Vaccines (2): up to date STI Screening: N/A

## 2023-06-24 LAB — CBC WITH DIFFERENTIAL/PLATELET
Basophils Absolute: 0 10*3/uL (ref 0.0–0.2)
Basos: 1 %
EOS (ABSOLUTE): 0.1 10*3/uL (ref 0.0–0.4)
Eos: 2 %
Hematocrit: 42.5 % (ref 34.0–46.6)
Hemoglobin: 13.8 g/dL (ref 11.1–15.9)
Immature Grans (Abs): 0 10*3/uL (ref 0.0–0.1)
Immature Granulocytes: 1 %
Lymphocytes Absolute: 1.5 10*3/uL (ref 0.7–3.1)
Lymphs: 37 %
MCH: 28.8 pg (ref 26.6–33.0)
MCHC: 32.5 g/dL (ref 31.5–35.7)
MCV: 89 fL (ref 79–97)
Monocytes Absolute: 0.3 10*3/uL (ref 0.1–0.9)
Monocytes: 9 %
Neutrophils Absolute: 2 10*3/uL (ref 1.4–7.0)
Neutrophils: 50 %
Platelets: 223 10*3/uL (ref 150–450)
RBC: 4.8 x10E6/uL (ref 3.77–5.28)
RDW: 13.6 % (ref 11.7–15.4)
WBC: 4 10*3/uL (ref 3.4–10.8)

## 2023-06-24 LAB — COMPREHENSIVE METABOLIC PANEL
ALT: 15 [IU]/L (ref 0–32)
AST: 22 [IU]/L (ref 0–40)
Albumin: 4.2 g/dL (ref 3.8–4.8)
Alkaline Phosphatase: 66 [IU]/L (ref 44–121)
BUN/Creatinine Ratio: 11 — ABNORMAL LOW (ref 12–28)
BUN: 11 mg/dL (ref 8–27)
Bilirubin Total: 1 mg/dL (ref 0.0–1.2)
CO2: 21 mmol/L (ref 20–29)
Calcium: 9.5 mg/dL (ref 8.7–10.3)
Chloride: 105 mmol/L (ref 96–106)
Creatinine, Ser: 0.99 mg/dL (ref 0.57–1.00)
Globulin, Total: 2.4 g/dL (ref 1.5–4.5)
Glucose: 90 mg/dL (ref 70–99)
Potassium: 4.7 mmol/L (ref 3.5–5.2)
Sodium: 142 mmol/L (ref 134–144)
Total Protein: 6.6 g/dL (ref 6.0–8.5)
eGFR: 58 mL/min/{1.73_m2} — ABNORMAL LOW (ref >59)

## 2023-06-24 LAB — LIPID PANEL W/O CHOL/HDL RATIO
Cholesterol, Total: 235 mg/dL — ABNORMAL HIGH (ref 100–199)
HDL: 75 mg/dL (ref >39)
LDL Chol Calc (NIH): 144 mg/dL — ABNORMAL HIGH (ref 0–99)
Triglycerides: 93 mg/dL (ref 0–149)
VLDL Cholesterol Cal: 16 mg/dL (ref 5–40)

## 2023-12-15 ENCOUNTER — Ambulatory Visit (INDEPENDENT_AMBULATORY_CARE_PROVIDER_SITE_OTHER): Admitting: Family Medicine

## 2023-12-15 ENCOUNTER — Encounter: Payer: Self-pay | Admitting: Family Medicine

## 2023-12-15 VITALS — BP 132/79 | HR 62 | Ht 60.0 in | Wt 168.8 lb

## 2023-12-15 DIAGNOSIS — M5416 Radiculopathy, lumbar region: Secondary | ICD-10-CM | POA: Insufficient documentation

## 2023-12-15 DIAGNOSIS — R300 Dysuria: Secondary | ICD-10-CM | POA: Diagnosis not present

## 2023-12-15 DIAGNOSIS — R8281 Pyuria: Secondary | ICD-10-CM

## 2023-12-15 LAB — MICROSCOPIC EXAMINATION
Bacteria, UA: NONE SEEN
RBC, Urine: NONE SEEN /HPF (ref 0–2)

## 2023-12-15 LAB — URINALYSIS, ROUTINE W REFLEX MICROSCOPIC
Bilirubin, UA: NEGATIVE
Glucose, UA: NEGATIVE
Ketones, UA: NEGATIVE
Nitrite, UA: NEGATIVE
Protein,UA: NEGATIVE
RBC, UA: NEGATIVE
Specific Gravity, UA: 1.01 (ref 1.005–1.030)
Urobilinogen, Ur: 0.2 mg/dL (ref 0.2–1.0)
pH, UA: 7 (ref 5.0–7.5)

## 2023-12-15 MED ORDER — PREGABALIN 25 MG PO CAPS: ORAL_CAPSULE | ORAL | 1 refills | Status: DC

## 2023-12-15 NOTE — Assessment & Plan Note (Signed)
 Did not tolerate gabapentin . Will try her on lyrica and recheck in 1 month. Call with any concerns.

## 2023-12-15 NOTE — Progress Notes (Signed)
 BP 132/79 (BP Location: Left Arm, Patient Position: Bed low/side rails up, Cuff Size: Normal)   Pulse 62   Ht 5' (1.524 m)   Wt 168 lb 12.8 oz (76.6 kg)   SpO2 97%   BMI 32.97 kg/m    Subjective:    Patient ID: Victoria Peterson, female    DOB: 01-22-1941, 83 y.o.   MRN: [id number]  HPI: Victoria Peterson is a 83 y.o. female  Chief Complaint  Patient presents with   Urinary Tract Infection    Dysuria. LBP. Onset about a week ago.    Back Pain   Leg Pain    Legs feel like they have a burning sensation Ongoing issues.    URINARY SYMPTOMS Duration: about a week Dysuria: no Urinary frequency: yes Urgency: yes Small volume voids: yes Symptom severity: no Urinary incontinence: yes Foul odor: no Hematuria: yes Abdominal pain: no Back pain: yes Suprapubic pain/pressure: no Flank pain: no Fever:  no Vomiting: no Relief with cranberry juice: no Relief with pyridium: no Status: better/worse/stable Previous urinary tract infection: yes Recurrent urinary tract infection: no History of sexually transmitted disease: no Vaginal discharge: no Treatments attempted: cranberry and increasing fluids   BACK PAIN Duration: chronic Mechanism of injury: unknown Location: bilateral and low back and L leg Onset: gradual Severity: severe Quality: numb and tingling  Frequency: constant Radiation: down L leg Aggravating factors: constant Alleviating factors: nothing Status: worse Treatments attempted: gabapentin , injections, diclofenac , tylenol   Relief with NSAIDs?: no Nighttime pain:  no Paresthesias / decreased sensation:  yes Bowel / bladder incontinence:  no Fevers:  no Dysuria / urinary frequency:  yes   Relevant past medical, surgical, family and social history reviewed and updated as indicated. Interim medical history since our last visit reviewed. Allergies and medications reviewed and updated.  Review of Systems  Constitutional: Negative.   Respiratory: Negative.     Cardiovascular: Negative.   Genitourinary:  Positive for frequency, hematuria and urgency. Negative for decreased urine volume, difficulty urinating, dyspareunia, dysuria, enuresis, flank pain, genital sores, menstrual problem, pelvic pain, vaginal bleeding, vaginal discharge and vaginal pain.  Musculoskeletal:  Positive for back pain and myalgias. Negative for arthralgias, gait problem, joint swelling, neck pain and neck stiffness.  Skin: Negative.   Neurological: Negative.   Psychiatric/Behavioral: Negative.      Per HPI unless specifically indicated above     Objective:     BP 132/79 (BP Location: Left Arm, Patient Position: Bed low/side rails up, Cuff Size: Normal)   Pulse 62   Ht 5' (1.524 m)   Wt 168 lb 12.8 oz (76.6 kg)   SpO2 97%   BMI 32.97 kg/m   Wt Readings from Last 3 Encounters:  12/15/23 168 lb 12.8 oz (76.6 kg)  06/23/23 176 lb 12.8 oz (80.2 kg)  05/11/23 166 lb (75.3 kg)    Physical Exam Vitals and nursing note reviewed.  Constitutional:      General: She is not in acute distress.    Appearance: Normal appearance. She is not ill-appearing, toxic-appearing or diaphoretic.  HENT:     Head: Normocephalic and atraumatic.     Right Ear: External ear normal.     Left Ear: External ear normal.     Nose: Nose normal.     Mouth/Throat:     Mouth: Mucous membranes are moist.     Pharynx: Oropharynx is clear.  Eyes:     General: No scleral icterus.       Right  eye: No discharge.        Left eye: No discharge.     Extraocular Movements: Extraocular movements intact.     Conjunctiva/sclera: Conjunctivae normal.     Pupils: Pupils are equal, round, and reactive to light.  Cardiovascular:     Rate and Rhythm: Normal rate and regular rhythm.     Pulses: Normal pulses.     Heart sounds: Normal heart sounds. No murmur heard.    No friction rub. No gallop.  Pulmonary:     Effort: Pulmonary effort is normal. No respiratory distress.     Breath sounds: Normal breath  sounds. No stridor. No wheezing, rhonchi or rales.  Chest:     Chest wall: No tenderness.  Musculoskeletal:        General: Normal range of motion.     Cervical back: Normal range of motion and neck supple.  Skin:    General: Skin is warm and dry.     Capillary Refill: Capillary refill takes less than 2 seconds.     Coloration: Skin is not jaundiced or pale.     Findings: No bruising, erythema, lesion or rash.  Neurological:     General: No focal deficit present.     Mental Status: She is alert and oriented to person, place, and time. Mental status is at baseline.  Psychiatric:        Mood and Affect: Mood normal.        Behavior: Behavior normal.        Thought Content: Thought content normal.        Judgment: Judgment normal.     Results for orders placed or performed in visit on 06/23/23  Bayer DCA Hb A1c Waived   Collection Time: 06/23/23 10:12 AM  Result Value Ref Range   HB A1C (BAYER DCA - WAIVED) 5.5 4.8 - 5.6 %  CBC with Differential/Platelet   Collection Time: 06/23/23 10:14 AM  Result Value Ref Range   WBC 4.0 3.4 - 10.8 x10E3/uL   RBC 4.80 3.77 - 5.28 x10E6/uL   Hemoglobin 13.8 11.1 - 15.9 g/dL   Hematocrit 16.1 09.6 - 46.6 %   MCV 89 79 - 97 fL   MCH 28.8 26.6 - 33.0 pg   MCHC 32.5 31.5 - 35.7 g/dL   RDW 04.5 40.9 - 81.1 %   Platelets 223 150 - 450 x10E3/uL   Neutrophils 50 Not Estab. %   Lymphs 37 Not Estab. %   Monocytes 9 Not Estab. %   Eos 2 Not Estab. %   Basos 1 Not Estab. %   Neutrophils Absolute 2.0 1.4 - 7.0 x10E3/uL   Lymphocytes Absolute 1.5 0.7 - 3.1 x10E3/uL   Monocytes Absolute 0.3 0.1 - 0.9 x10E3/uL   EOS (ABSOLUTE) 0.1 0.0 - 0.4 x10E3/uL   Basophils Absolute 0.0 0.0 - 0.2 x10E3/uL   Immature Granulocytes 1 Not Estab. %   Immature Grans (Abs) 0.0 0.0 - 0.1 x10E3/uL  Lipid Panel w/o Chol/HDL Ratio   Collection Time: 06/23/23 10:14 AM  Result Value Ref Range   Cholesterol, Total 235 (H) 100 - 199 mg/dL   Triglycerides 93 0 - 149 mg/dL    HDL 75 >91 mg/dL   VLDL Cholesterol Cal 16 5 - 40 mg/dL   LDL Chol Calc (NIH) 478 (H) 0 - 99 mg/dL  Comprehensive metabolic panel   Collection Time: 06/23/23 10:14 AM  Result Value Ref Range   Glucose 90 70 - 99 mg/dL   BUN 11  8 - 27 mg/dL   Creatinine, Ser 9.81 0.57 - 1.00 mg/dL   eGFR 58 (L) >19 JY/NWG/9.56   BUN/Creatinine Ratio 11 (L) 12 - 28   Sodium 142 134 - 144 mmol/L   Potassium 4.7 3.5 - 5.2 mmol/L   Chloride 105 96 - 106 mmol/L   CO2 21 20 - 29 mmol/L   Calcium  9.5 8.7 - 10.3 mg/dL   Total Protein 6.6 6.0 - 8.5 g/dL   Albumin  4.2 3.8 - 4.8 g/dL   Globulin, Total 2.4 1.5 - 4.5 g/dL   Bilirubin Total 1.0 0.0 - 1.2 mg/dL   Alkaline Phosphatase 66 44 - 121 IU/L   AST 22 0 - 40 IU/L   ALT 15 0 - 32 IU/L      Assessment & Plan:   Problem List Items Addressed This Visit       Nervous and Auditory   Lumbar radiculopathy   Did not tolerate gabapentin . Will try her on lyrica and recheck in 1 month. Call with any concerns.       Relevant Medications   pregabalin (LYRICA) 25 MG capsule   Other Visit Diagnoses       Dysuria    -  Primary   Trace leuks. Will send for culture. Await results.   Relevant Orders   Urinalysis, Routine w reflex microscopic     Pyuria       Trace leuks. Will send for culture. Await results.   Relevant Orders   Urine Culture        Follow up plan: Return in about 4 weeks (around 01/12/2024) for physical.

## 2023-12-17 ENCOUNTER — Ambulatory Visit: Payer: Self-pay | Admitting: Family Medicine

## 2023-12-17 LAB — URINE CULTURE

## 2024-01-21 ENCOUNTER — Encounter: Payer: Self-pay | Admitting: Family Medicine

## 2024-01-21 ENCOUNTER — Ambulatory Visit (INDEPENDENT_AMBULATORY_CARE_PROVIDER_SITE_OTHER): Admitting: Family Medicine

## 2024-01-21 VITALS — BP 133/79 | HR 83 | Ht 60.0 in | Wt 168.0 lb

## 2024-01-21 DIAGNOSIS — E782 Mixed hyperlipidemia: Secondary | ICD-10-CM

## 2024-01-21 DIAGNOSIS — R7303 Prediabetes: Secondary | ICD-10-CM | POA: Diagnosis not present

## 2024-01-21 DIAGNOSIS — G2581 Restless legs syndrome: Secondary | ICD-10-CM | POA: Diagnosis not present

## 2024-01-21 DIAGNOSIS — M5416 Radiculopathy, lumbar region: Secondary | ICD-10-CM

## 2024-01-21 DIAGNOSIS — G959 Disease of spinal cord, unspecified: Secondary | ICD-10-CM

## 2024-01-21 DIAGNOSIS — Z0000 Encounter for general adult medical examination without abnormal findings: Secondary | ICD-10-CM | POA: Diagnosis not present

## 2024-01-21 LAB — BAYER DCA HB A1C WAIVED: HB A1C (BAYER DCA - WAIVED): 5.6 % (ref 4.8–5.6)

## 2024-01-21 MED ORDER — PREGABALIN 50 MG PO CAPS: ORAL_CAPSULE | ORAL | 2 refills | Status: DC
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
## 2024-01-21 NOTE — Assessment & Plan Note (Signed)
 Will increase her lyrica  to 100mg  BID and recheck in 2 months. Encouraged her to reach out to PM&R.

## 2024-01-21 NOTE — Assessment & Plan Note (Signed)
 Under good control on current regimen. Continue current regimen. Continue to monitor. Call with any concerns. Refills given.

## 2024-01-21 NOTE — Assessment & Plan Note (Signed)
Doing great with A1c of 5.6. Continue current regimen. Continue to monitor. Call with any concerns.  

## 2024-01-21 NOTE — Assessment & Plan Note (Signed)
 Rechecking labs today. Await results. Treat as needed.

## 2024-01-21 NOTE — Patient Instructions (Signed)
 Take 1 50mg  lyrica  in the AM and 1 50mg  and 1 25mg  in the PM for 1 week  Take 1 50mg  and 1 25mg  in the AM and 1 50mg  and 1 25mg  in the PM for 1 week  Take 1 50mg  and 1 25mg  in the AM  and 2 50mg  in the PM for 1 week  Take 2 50mg  BID until you see me next time.

## 2024-01-22 LAB — COMPREHENSIVE METABOLIC PANEL WITH GFR
ALT: 12 IU/L (ref 0–32)
AST: 22 IU/L (ref 0–40)
Albumin: 4.3 g/dL (ref 3.7–4.7)
Alkaline Phosphatase: 65 IU/L (ref 44–121)
BUN/Creatinine Ratio: 7 — ABNORMAL LOW (ref 12–28)
BUN: 8 mg/dL (ref 8–27)
Bilirubin Total: 0.9 mg/dL (ref 0.0–1.2)
CO2: 21 mmol/L (ref 20–29)
Calcium: 9.4 mg/dL (ref 8.7–10.3)
Chloride: 105 mmol/L (ref 96–106)
Creatinine, Ser: 1.14 mg/dL — ABNORMAL HIGH (ref 0.57–1.00)
Globulin, Total: 2.5 g/dL (ref 1.5–4.5)
Glucose: 102 mg/dL — ABNORMAL HIGH (ref 70–99)
Potassium: 4.5 mmol/L (ref 3.5–5.2)
Sodium: 140 mmol/L (ref 134–144)
Total Protein: 6.8 g/dL (ref 6.0–8.5)
eGFR: 48 mL/min/{1.73_m2} — ABNORMAL LOW (ref >59)

## 2024-01-22 LAB — CBC WITH DIFFERENTIAL/PLATELET
Basophils Absolute: 0 10*3/uL (ref 0.0–0.2)
Basos: 1 %
EOS (ABSOLUTE): 0.1 10*3/uL (ref 0.0–0.4)
Eos: 3 %
Hematocrit: 42.8 % (ref 34.0–46.6)
Hemoglobin: 13.8 g/dL (ref 11.1–15.9)
Immature Grans (Abs): 0 10*3/uL (ref 0.0–0.1)
Immature Granulocytes: 0 %
Lymphocytes Absolute: 1.6 10*3/uL (ref 0.7–3.1)
Lymphs: 38 %
MCH: 29.9 pg (ref 26.6–33.0)
MCHC: 32.2 g/dL (ref 31.5–35.7)
MCV: 93 fL (ref 79–97)
Monocytes Absolute: 0.4 10*3/uL (ref 0.1–0.9)
Monocytes: 8 %
Neutrophils Absolute: 2.1 10*3/uL (ref 1.4–7.0)
Neutrophils: 50 %
Platelets: 218 10*3/uL (ref 150–450)
RBC: 4.61 x10E6/uL (ref 3.77–5.28)
RDW: 12.7 % (ref 11.7–15.4)
WBC: 4.2 10*3/uL (ref 3.4–10.8)

## 2024-01-22 LAB — LIPID PANEL W/O CHOL/HDL RATIO
Cholesterol, Total: 226 mg/dL — ABNORMAL HIGH (ref 100–199)
HDL: 71 mg/dL (ref >39)
LDL Chol Calc (NIH): 140 mg/dL — ABNORMAL HIGH (ref 0–99)
Triglycerides: 87 mg/dL (ref 0–149)
VLDL Cholesterol Cal: 15 mg/dL (ref 5–40)

## 2024-01-22 LAB — TSH: TSH: 1.75 u[IU]/mL (ref 0.450–4.500)

## 2024-01-24 ENCOUNTER — Ambulatory Visit: Payer: Self-pay | Admitting: Family Medicine

## 2024-02-03 DIAGNOSIS — Z96652 Presence of left artificial knee joint: Secondary | ICD-10-CM | POA: Diagnosis not present

## 2024-02-03 DIAGNOSIS — M1711 Unilateral primary osteoarthritis, right knee: Secondary | ICD-10-CM | POA: Diagnosis not present

## 2024-03-27 ENCOUNTER — Encounter: Payer: Self-pay | Admitting: Family Medicine

## 2024-03-27 ENCOUNTER — Ambulatory Visit (INDEPENDENT_AMBULATORY_CARE_PROVIDER_SITE_OTHER): Admitting: Family Medicine

## 2024-03-27 VITALS — BP 139/77 | HR 62 | Temp 97.9°F | Ht 60.0 in | Wt 171.6 lb

## 2024-03-27 DIAGNOSIS — M5416 Radiculopathy, lumbar region: Secondary | ICD-10-CM

## 2024-03-27 DIAGNOSIS — G959 Disease of spinal cord, unspecified: Secondary | ICD-10-CM

## 2024-03-27 MED ORDER — PREGABALIN 100 MG PO CAPS: 100.0000 mg | ORAL_CAPSULE | Freq: Two times a day (BID) | ORAL | 2 refills | Status: DC

## 2024-03-27 NOTE — Patient Instructions (Signed)
 Take 1 pill of your 50mg  lyrica  in the AM and 1 pill of your 100mg  lyrica  in the PM for 2 weeks Take 1 pill of your 100mg  lyrica  BID

## 2024-03-27 NOTE — Assessment & Plan Note (Signed)
 Sleeping better on the 100mg  of lyrica  at night- has not been taking it during the day. Will continue to titrate her to 100mg  BID and recheck in about 2-3 months. Call with any concerns.

## 2024-03-27 NOTE — Progress Notes (Signed)
 BP 139/77   Pulse 62   Temp 97.9 F (36.6 C) (Oral)   Ht 5' (1.524 m)   Wt 171 lb 9.6 oz (77.8 kg)   BMI 33.51 kg/m    Subjective:    Patient ID: Victoria Peterson, female    DOB: 1941/07/01, 83 y.o.   MRN: [id number]  HPI: Victoria Peterson is a 83 y.o. female  Chief Complaint  Patient presents with   Back Pain   Neck Pain   CHRONIC PAIN  Present dose:  Has been just taking 100mg  lyrica  in the PM Pain control status: better Duration: chronic Location: low back and neck pain Quality: aching, sore, numb Current Pain Level: moderate Previous Pain Level: severe Breakthrough pain: yes Benefit from narcotic medications: N/A Previous pain specialty evaluation: yes Non-narcotic analgesic meds: yes Narcotic contract: N/A  Relevant past medical, surgical, family and social history reviewed and updated as indicated. Interim medical history since our last visit reviewed. Allergies and medications reviewed and updated.  Review of Systems  Constitutional: Negative.   Respiratory: Negative.    Cardiovascular: Negative.   Musculoskeletal:  Positive for back pain and myalgias. Negative for arthralgias, gait problem, joint swelling, neck pain and neck stiffness.  Skin: Negative.   Neurological: Negative.   Psychiatric/Behavioral: Negative.      Per HPI unless specifically indicated above     Objective:    BP 139/77   Pulse 62   Temp 97.9 F (36.6 C) (Oral)   Ht 5' (1.524 m)   Wt 171 lb 9.6 oz (77.8 kg)   BMI 33.51 kg/m   Wt Readings from Last 3 Encounters:  03/27/24 171 lb 9.6 oz (77.8 kg)  01/21/24 168 lb (76.2 kg)  12/15/23 168 lb 12.8 oz (76.6 kg)    Physical Exam Vitals and nursing note reviewed.  Constitutional:      General: She is not in acute distress.    Appearance: Normal appearance. She is not ill-appearing, toxic-appearing or diaphoretic.  HENT:     Head: Normocephalic and atraumatic.     Right Ear: External ear normal.     Left Ear: External ear  normal.     Nose: Nose normal.     Mouth/Throat:     Mouth: Mucous membranes are moist.     Pharynx: Oropharynx is clear.  Eyes:     General: No scleral icterus.       Right eye: No discharge.        Left eye: No discharge.     Extraocular Movements: Extraocular movements intact.     Conjunctiva/sclera: Conjunctivae normal.     Pupils: Pupils are equal, round, and reactive to light.  Cardiovascular:     Rate and Rhythm: Normal rate and regular rhythm.     Pulses: Normal pulses.     Heart sounds: Normal heart sounds. No murmur heard.    No friction rub. No gallop.  Pulmonary:     Effort: Pulmonary effort is normal. No respiratory distress.     Breath sounds: Normal breath sounds. No stridor. No wheezing, rhonchi or rales.  Chest:     Chest wall: No tenderness.  Musculoskeletal:        General: Normal range of motion.     Cervical back: Normal range of motion and neck supple.  Skin:    General: Skin is warm and dry.     Capillary Refill: Capillary refill takes less than 2 seconds.     Coloration: Skin is not  jaundiced or pale.     Findings: No bruising, erythema, lesion or rash.  Neurological:     General: No focal deficit present.     Mental Status: She is alert and oriented to person, place, and time. Mental status is at baseline.  Psychiatric:        Mood and Affect: Mood normal.        Behavior: Behavior normal.        Thought Content: Thought content normal.        Judgment: Judgment normal.     Results for orders placed or performed in visit on 01/21/24  Bayer DCA Hb A1c Waived   Collection Time: 01/21/24  8:59 AM  Result Value Ref Range   HB A1C (BAYER DCA - WAIVED) 5.6 4.8 - 5.6 %  CBC with Differential/Platelet   Collection Time: 01/21/24  9:00 AM  Result Value Ref Range   WBC 4.2 3.4 - 10.8 x10E3/uL   RBC 4.61 3.77 - 5.28 x10E6/uL   Hemoglobin 13.8 11.1 - 15.9 g/dL   Hematocrit 57.1 65.9 - 46.6 %   MCV 93 79 - 97 fL   MCH 29.9 26.6 - 33.0 pg   MCHC 32.2  31.5 - 35.7 g/dL   RDW 87.2 88.2 - 84.5 %   Platelets 218 150 - 450 x10E3/uL   Neutrophils 50 Not Estab. %   Lymphs 38 Not Estab. %   Monocytes 8 Not Estab. %   Eos 3 Not Estab. %   Basos 1 Not Estab. %   Neutrophils Absolute 2.1 1.4 - 7.0 x10E3/uL   Lymphocytes Absolute 1.6 0.7 - 3.1 x10E3/uL   Monocytes Absolute 0.4 0.1 - 0.9 x10E3/uL   EOS (ABSOLUTE) 0.1 0.0 - 0.4 x10E3/uL   Basophils Absolute 0.0 0.0 - 0.2 x10E3/uL   Immature Granulocytes 0 Not Estab. %   Immature Grans (Abs) 0.0 0.0 - 0.1 x10E3/uL  Comprehensive metabolic panel with GFR   Collection Time: 01/21/24  9:00 AM  Result Value Ref Range   Glucose 102 (H) 70 - 99 mg/dL   BUN 8 8 - 27 mg/dL   Creatinine, Ser 8.85 (H) 0.57 - 1.00 mg/dL   eGFR 48 (L) >40 fO/fpw/8.26   BUN/Creatinine Ratio 7 (L) 12 - 28   Sodium 140 134 - 144 mmol/L   Potassium 4.5 3.5 - 5.2 mmol/L   Chloride 105 96 - 106 mmol/L   CO2 21 20 - 29 mmol/L   Calcium  9.4 8.7 - 10.3 mg/dL   Total Protein 6.8 6.0 - 8.5 g/dL   Albumin  4.3 3.7 - 4.7 g/dL   Globulin, Total 2.5 1.5 - 4.5 g/dL   Bilirubin Total 0.9 0.0 - 1.2 mg/dL   Alkaline Phosphatase 65 44 - 121 IU/L   AST 22 0 - 40 IU/L   ALT 12 0 - 32 IU/L  Lipid Panel w/o Chol/HDL Ratio   Collection Time: 01/21/24  9:00 AM  Result Value Ref Range   Cholesterol, Total 226 (H) 100 - 199 mg/dL   Triglycerides 87 0 - 149 mg/dL   HDL 71 >60 mg/dL   VLDL Cholesterol Cal 15 5 - 40 mg/dL   LDL Chol Calc (NIH) 859 (H) 0 - 99 mg/dL  TSH   Collection Time: 01/21/24  9:00 AM  Result Value Ref Range   TSH 1.750 0.450 - 4.500 uIU/mL      Assessment & Plan:   Problem List Items Addressed This Visit       Nervous and  Auditory   Cervical myelopathy (HCC)   Sleeping better on the 100mg  of lyrica  at night- has not been taking it during the day. Will continue to titrate her to 100mg  BID and recheck in about 2-3 months. Call with any concerns.       Lumbar radiculopathy - Primary   Sleeping better on the  100mg  of lyrica  at night- has not been taking it during the day. Will continue to titrate her to 100mg  BID and recheck in about 2-3 months. Call with any concerns.       Relevant Medications   pregabalin  (LYRICA ) 100 MG capsule     Follow up plan: Return in about 3 months (around 06/13/2024).

## 2024-05-23 ENCOUNTER — Other Ambulatory Visit: Payer: Self-pay | Admitting: Urology

## 2024-05-23 DIAGNOSIS — N200 Calculus of kidney: Secondary | ICD-10-CM
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
## 2024-05-25 ENCOUNTER — Encounter: Payer: Self-pay | Admitting: Urology

## 2024-05-25 ENCOUNTER — Ambulatory Visit: Admitting: Urology

## 2024-05-25 ENCOUNTER — Ambulatory Visit
Admission: RE | Admit: 2024-05-25 | Discharge: 2024-05-25 | Disposition: A | Discharge status: Home / Self Care | Attending: Urology | Admitting: Urology

## 2024-05-25 ENCOUNTER — Ambulatory Visit
Admission: RE | Admit: 2024-05-25 | Discharge: 2024-05-25 | Disposition: A | Discharge status: Home / Self Care | Source: Ambulatory Visit | Attending: Urology | Admitting: Urology

## 2024-05-25 VITALS — BP 130/79 | HR 86 | Ht 60.0 in | Wt 175.0 lb

## 2024-05-25 DIAGNOSIS — N200 Calculus of kidney: Secondary | ICD-10-CM

## 2024-05-25 DIAGNOSIS — R3989 Other symptoms and signs involving the genitourinary system: Secondary | ICD-10-CM | POA: Diagnosis not present

## 2024-05-25 LAB — MICROSCOPIC EXAMINATION

## 2024-05-25 LAB — URINALYSIS, COMPLETE
Bilirubin, UA: NEGATIVE
Glucose, UA: NEGATIVE
Ketones, UA: NEGATIVE
Nitrite, UA: NEGATIVE
Protein,UA: NEGATIVE
Specific Gravity, UA: 1.01 (ref 1.005–1.030)
Urobilinogen, Ur: 0.2 mg/dL (ref 0.2–1.0)
pH, UA: 6 (ref 5.0–7.5)

## 2024-05-25 MED ORDER — CIPROFLOXACIN HCL 250 MG PO TABS: 250.0000 mg | ORAL_TABLET | Freq: Two times a day (BID) | ORAL | 0 refills | Status: DC

## 2024-05-30 ENCOUNTER — Ambulatory Visit: Payer: Self-pay | Admitting: Urology

## 2024-05-30 LAB — CULTURE, URINE COMPREHENSIVE

## 2024-06-19 ENCOUNTER — Ambulatory Visit (INDEPENDENT_AMBULATORY_CARE_PROVIDER_SITE_OTHER): Admitting: Family Medicine

## 2024-06-19 ENCOUNTER — Encounter: Payer: Self-pay | Admitting: Family Medicine

## 2024-06-19 VITALS — BP 107/70 | HR 67 | Temp 98.0°F | Ht 60.0 in | Wt 181.8 lb

## 2024-06-19 DIAGNOSIS — M5416 Radiculopathy, lumbar region: Secondary | ICD-10-CM | POA: Diagnosis not present

## 2024-06-19 DIAGNOSIS — G2581 Restless legs syndrome: Secondary | ICD-10-CM

## 2024-06-19 DIAGNOSIS — N898 Other specified noninflammatory disorders of vagina: Secondary | ICD-10-CM

## 2024-06-19 DIAGNOSIS — E782 Mixed hyperlipidemia: Secondary | ICD-10-CM | POA: Diagnosis not present

## 2024-06-19 DIAGNOSIS — R7303 Prediabetes: Secondary | ICD-10-CM | POA: Diagnosis not present

## 2024-06-19 LAB — WET PREP FOR TRICH, YEAST, CLUE
Clue Cell Exam: NEGATIVE
Trichomonas Exam: NEGATIVE
Yeast Exam: NEGATIVE

## 2024-06-19 LAB — BAYER DCA HB A1C WAIVED: HB A1C (BAYER DCA - WAIVED): 5.5 % (ref 4.8–5.6)

## 2024-06-19 MED ORDER — PREGABALIN 100 MG PO CAPS: 100.0000 mg | ORAL_CAPSULE | Freq: Every day | ORAL | 0 refills | Status: AC

## 2024-06-19 MED ORDER — ALBUTEROL SULFATE HFA 108 (90 BASE) MCG/ACT IN AERS: 2.0000 | INHALATION_SPRAY | Freq: Four times a day (QID) | RESPIRATORY_TRACT | 3 refills | Status: AC | PRN

## 2024-06-19 NOTE — Assessment & Plan Note (Signed)
 Rechecking labs today. Await results. Treat as needed.

## 2024-06-19 NOTE — Assessment & Plan Note (Signed)
 Doing great with A1c of 5.5. Continue diet and exercise. Continue to monitor. Call with any concerns.

## 2024-06-19 NOTE — Assessment & Plan Note (Signed)
 Doing well on her lyrica . Call with any concerns. Continue to monitor.

## 2024-06-19 NOTE — Assessment & Plan Note (Signed)
 Under good control on current regimen. Continue current regimen. Continue to monitor. Call with any concerns. Refills given.

## 2024-06-19 NOTE — Progress Notes (Signed)
 BP 107/70   Pulse 67   Temp 98 F (36.7 C) (Oral)   Ht 5' (1.524 m)   Wt 181 lb 12.8 oz (82.5 kg)   SpO2 99%   BMI 35.51 kg/m    Subjective:    Patient ID: Victoria Peterson, female    DOB: [birth year], 83 y.o.   MRN: [id number]  HPI: Victoria Peterson is a 83 y.o. female  Chief Complaint  Patient presents with   Back Pain    Lyrica  has helped. Able to sleep    Knee Pain   Wheezing    Pt states that she felt and heard wheezing about 10 days. Comes and goes. Worse than other times. Worse on exertion    Vaginitis    Possible yeast infection, after taking cipro . Vaginal itching.    Impaired Fasting Glucose HbA1C:  Lab Results  Component Value Date   HGBA1C 5.6 01/21/2024   Duration of elevated blood sugar: chronic Polydipsia: no Polyuria: no Weight change: no Visual disturbance: no Glucose Monitoring: no Diabetic Education: Completed Family history of diabetes: no  VAGINAL DISCHARGE Duration: 3-4 weeks Discharge description: white  Pruritus: yes Dysuria: yes Malodorous: no Urinary frequency: no Fevers: no Abdominal pain: no  History of sexually transmitted diseases: no Recent antibiotic use: yes Context: worse  Treatments attempted: none  HYPERLIPIDEMIA Hyperlipidemia status: stable Satisfied with current treatment?  Not on anything Past cholesterol meds: none Supplements: none Aspirin :  no The ASCVD Risk score (Arnett DK, et al., 2019) failed to calculate for the following reasons:   The 2019 ASCVD risk score is only valid for ages 35 to 74 Chest pain:  no Coronary artery disease:  no Family history CAD:  no Family history early CAD:  no  CHRONIC PAIN  Present dose:  Has been just taking 100mg  lyrica  in the PM- no more pain. Feeling better Pain control status: better Duration: chronic Location: low back and neck pain Quality: aching, sore, numb Current Pain Level: resolved Previous Pain Level: severe Breakthrough pain: yes Benefit from  narcotic medications: N/A Previous pain specialty evaluation: yes Non-narcotic analgesic meds: yes Narcotic contract: N/A  WHEEZING Duration: about 10 days Onset: sudden Description of breathing discomfort: wheezing Severity: mild Episode duration: minutes Frequency: few times daily Related to exertion: yes Cough: no Chest tightness: no Wheezing: yes Fevers: no Chest pain: no Palpitations: no  Nausea: no Diaphoresis: no Deconditioning: no Status: worse  Relevant past medical, surgical, family and social history reviewed and updated as indicated. Interim medical history since our last visit reviewed. Allergies and medications reviewed and updated.  Review of Systems  Constitutional: Negative.   HENT: Negative.    Respiratory:  Positive for shortness of breath and wheezing. Negative for apnea, cough, choking, chest tightness and stridor.   Cardiovascular: Negative.   Musculoskeletal:  Positive for back pain. Negative for arthralgias, gait problem, joint swelling, myalgias, neck pain and neck stiffness.  Skin: Negative.   Neurological: Negative.   Psychiatric/Behavioral: Negative.      Per HPI unless specifically indicated above     Objective:    BP 107/70   Pulse 67   Temp 98 F (36.7 C) (Oral)   Ht 5' (1.524 m)   Wt 181 lb 12.8 oz (82.5 kg)   SpO2 99%   BMI 35.51 kg/m   Wt Readings from Last 3 Encounters:  06/19/24 181 lb 12.8 oz (82.5 kg)  05/25/24 175 lb (79.4 kg)  03/27/24 171 lb 9.6 oz (77.8 kg)  Physical Exam Vitals and nursing note reviewed.  Constitutional:      General: She is not in acute distress.    Appearance: Normal appearance. She is not ill-appearing, toxic-appearing or diaphoretic.  HENT:     Head: Normocephalic and atraumatic.     Right Ear: External ear normal.     Left Ear: External ear normal.     Nose: Nose normal.     Mouth/Throat:     Mouth: Mucous membranes are moist.     Pharynx: Oropharynx is clear.  Eyes:     General:  No scleral icterus.       Right eye: No discharge.        Left eye: No discharge.     Extraocular Movements: Extraocular movements intact.     Conjunctiva/sclera: Conjunctivae normal.     Pupils: Pupils are equal, round, and reactive to light.  Cardiovascular:     Rate and Rhythm: Normal rate and regular rhythm.     Pulses: Normal pulses.     Heart sounds: Normal heart sounds. No murmur heard.    No friction rub. No gallop.  Pulmonary:     Effort: Pulmonary effort is normal. No respiratory distress.     Breath sounds: Normal breath sounds. No stridor. No wheezing, rhonchi or rales.  Chest:     Chest wall: No tenderness.  Musculoskeletal:        General: Normal range of motion.     Cervical back: Normal range of motion and neck supple.  Skin:    General: Skin is warm and dry.     Capillary Refill: Capillary refill takes less than 2 seconds.     Coloration: Skin is not jaundiced or pale.     Findings: No bruising, erythema, lesion or rash.  Neurological:     General: No focal deficit present.     Mental Status: She is alert and oriented to person, place, and time. Mental status is at baseline.  Psychiatric:        Mood and Affect: Mood normal.        Behavior: Behavior normal.        Thought Content: Thought content normal.        Judgment: Judgment normal.     Results for orders placed or performed in visit on 05/25/24  CULTURE, URINE COMPREHENSIVE   Collection Time: 05/25/24  9:17 AM   Specimen: Urine   UR  Result Value Ref Range   Urine Culture, Comprehensive Final report    Organism ID, Bacteria Comment   Microscopic Examination   Collection Time: 05/25/24  9:17 AM   Urine  Result Value Ref Range   WBC, UA 11-30 (A) 0 - 5 /hpf   RBC, Urine 0-2 0 - 2 /hpf   Epithelial Cells (non renal) 0-10 0 - 10 /hpf   Bacteria, UA Moderate (A) None seen/Few  Urinalysis, Complete   Collection Time: 05/25/24  9:17 AM  Result Value Ref Range   Specific Gravity, UA 1.010 1.005 -  1.030   pH, UA 6.0 5.0 - 7.5   Color, UA Yellow Yellow   Appearance Ur Clear Clear   Leukocytes,UA 1+ (A) Negative   Protein,UA Negative Negative/Trace   Glucose, UA Negative Negative   Ketones, UA Negative Negative   RBC, UA Trace (A) Negative   Bilirubin, UA Negative Negative   Urobilinogen, Ur 0.2 0.2 - 1.0 mg/dL   Nitrite, UA Negative Negative   Microscopic Examination See below:  Assessment & Plan:   Problem List Items Addressed This Visit       Nervous and Auditory   Lumbar radiculopathy   Under good control on current regimen. Continue current regimen. Continue to monitor. Call with any concerns. Refills given.        Relevant Medications   pregabalin  (LYRICA ) 100 MG capsule     Other   Prediabetes - Primary   Doing great with A1c of 5.5. Continue diet and exercise. Continue to monitor. Call with any concerns.       Relevant Orders   Comprehensive metabolic panel with GFR   Bayer DCA Hb A1c Waived   RLS (restless legs syndrome)   Doing well on her lyrica . Call with any concerns. Continue to monitor.       Relevant Orders   CBC with Differential/Platelet   Ferritin   Mixed hyperlipidemia   Rechecking labs today. Await results. Treat as needed.       Relevant Orders   Comprehensive metabolic panel with GFR   Lipid Panel w/o Chol/HDL Ratio   Other Visit Diagnoses       Vaginal discharge       Wet prep normal. Increase fluids. Call if not better in 1 week.   Relevant Orders   WET PREP FOR TRICH, YEAST, CLUE        Follow up plan: Return in about 3 months (around 09/19/2024) for physical.

## 2024-06-20 LAB — COMPREHENSIVE METABOLIC PANEL WITH GFR
ALT: 15 IU/L (ref 0–32)
AST: 24 IU/L (ref 0–40)
Albumin: 4.3 g/dL (ref 3.7–4.7)
Alkaline Phosphatase: 63 IU/L (ref 48–129)
BUN/Creatinine Ratio: 15 (ref 12–28)
BUN: 15 mg/dL (ref 8–27)
Bilirubin Total: 0.8 mg/dL (ref 0.0–1.2)
CO2: 24 mmol/L (ref 20–29)
Calcium: 9.8 mg/dL (ref 8.7–10.3)
Chloride: 105 mmol/L (ref 96–106)
Creatinine, Ser: 1.01 mg/dL — ABNORMAL HIGH (ref 0.57–1.00)
Globulin, Total: 2.3 g/dL (ref 1.5–4.5)
Glucose: 97 mg/dL (ref 70–99)
Potassium: 4.4 mmol/L (ref 3.5–5.2)
Sodium: 143 mmol/L (ref 134–144)
Total Protein: 6.6 g/dL (ref 6.0–8.5)
eGFR: 56 mL/min/1.73 — ABNORMAL LOW (ref >59)

## 2024-06-20 LAB — CBC WITH DIFFERENTIAL/PLATELET
Basophils Absolute: 0.1 x10E3/uL (ref 0.0–0.2)
Basos: 1 %
EOS (ABSOLUTE): 0.2 x10E3/uL (ref 0.0–0.4)
Eos: 6 %
Hematocrit: 43.1 % (ref 34.0–46.6)
Hemoglobin: 13.7 g/dL (ref 11.1–15.9)
Immature Grans (Abs): 0 x10E3/uL (ref 0.0–0.1)
Immature Granulocytes: 0 %
Lymphocytes Absolute: 1.4 x10E3/uL (ref 0.7–3.1)
Lymphs: 36 %
MCH: 28.7 pg (ref 26.6–33.0)
MCHC: 31.8 g/dL (ref 31.5–35.7)
MCV: 90 fL (ref 79–97)
Monocytes Absolute: 0.3 x10E3/uL (ref 0.1–0.9)
Monocytes: 8 %
Neutrophils Absolute: 1.8 x10E3/uL (ref 1.4–7.0)
Neutrophils: 49 %
Platelets: 212 x10E3/uL (ref 150–450)
RBC: 4.78 x10E6/uL (ref 3.77–5.28)
RDW: 13.2 % (ref 11.7–15.4)
WBC: 3.8 x10E3/uL (ref 3.4–10.8)

## 2024-06-20 LAB — LIPID PANEL W/O CHOL/HDL RATIO
Cholesterol, Total: 237 mg/dL — ABNORMAL HIGH (ref 100–199)
HDL: 74 mg/dL (ref >39)
LDL Chol Calc (NIH): 143 mg/dL — ABNORMAL HIGH (ref 0–99)
Triglycerides: 117 mg/dL (ref 0–149)
VLDL Cholesterol Cal: 20 mg/dL (ref 5–40)

## 2024-06-20 LAB — FERRITIN: Ferritin: 35 ng/mL (ref 15–150)

## 2024-06-27 ENCOUNTER — Ambulatory Visit: Payer: Self-pay | Admitting: Family Medicine
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
## 2024-07-25 ENCOUNTER — Encounter: Payer: Self-pay | Admitting: Urology

## 2024-07-25 ENCOUNTER — Ambulatory Visit: Admitting: Urology

## 2024-07-25 VITALS — BP 153/87 | HR 106

## 2024-07-25 DIAGNOSIS — N200 Calculus of kidney: Secondary | ICD-10-CM

## 2024-07-25 LAB — MICROSCOPIC EXAMINATION

## 2024-07-25 LAB — URINALYSIS, COMPLETE
Bilirubin, UA: NEGATIVE
Glucose, UA: NEGATIVE
Ketones, UA: NEGATIVE
Nitrite, UA: NEGATIVE
Protein,UA: NEGATIVE
RBC, UA: NEGATIVE
Specific Gravity, UA: <1.005 — ABNORMAL LOW (ref 1.005–1.030)
Urobilinogen, Ur: 0.2 mg/dL (ref 0.2–1.0)
pH, UA: 6 (ref 5.0–7.5)

## 2024-07-25 LAB — BLADDER SCAN AMB NON-IMAGING

## 2024-07-25 NOTE — Patient Instructions (Signed)
 We have ordered a KUB(xray) to be done prior to your next appointment.   Please go to: [facility] [address] [place], [place] [address] [phone]  or [facility] [address] [place], [place], [place] [address] [phone] 1 day prior or and hour before your scheduled appointment with us  at [facility].  You do not need an appointment for this.  You will receive a phone call reminding you of the office visit, but not the KUB(xray).  It is very important to do this prior to your appointment so there are no delays in your care.

## 2024-09-19 ENCOUNTER — Ambulatory Visit: Admitting: Family Medicine

## 2025-07-25 ENCOUNTER — Ambulatory Visit: Admitting: Urology
